# Patient Record
Sex: Male | Born: 1935 | Race: White | Hispanic: No | Smoking: Never smoker
Health system: Southern US, Community
[De-identification: ages and names within clinical notes are randomized; demographics above are authoritative.]

## PROBLEM LIST (undated history)

## (undated) DIAGNOSIS — E119 Type 2 diabetes mellitus without complications: Secondary | ICD-10-CM

## (undated) DIAGNOSIS — I2510 Atherosclerotic heart disease of native coronary artery without angina pectoris: Secondary | ICD-10-CM

## (undated) DIAGNOSIS — C4490 Unspecified malignant neoplasm of skin, unspecified: Secondary | ICD-10-CM

## (undated) DIAGNOSIS — K319 Disease of stomach and duodenum, unspecified: Secondary | ICD-10-CM

## (undated) DIAGNOSIS — E785 Hyperlipidemia, unspecified: Secondary | ICD-10-CM

## (undated) DIAGNOSIS — I10 Essential (primary) hypertension: Secondary | ICD-10-CM

## (undated) HISTORY — DX: Essential (primary) hypertension: I10

## (undated) HISTORY — DX: Unspecified malignant neoplasm of skin, unspecified: C44.90

## (undated) HISTORY — DX: Hyperlipidemia, unspecified: E78.5

## (undated) HISTORY — DX: Atherosclerotic heart disease of native coronary artery without angina pectoris: I25.10

## (undated) HISTORY — DX: Disease of stomach and duodenum, unspecified: K31.9

---
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
## 1968-07-27 HISTORY — PX: TUMOR REMOVAL: SHX12

## 1973-07-27 HISTORY — PX: VASECTOMY: SHX75

## 1990-07-27 HISTORY — PX: PROSTATE SURGERY: SHX751

## 1999-07-28 HISTORY — PX: CORONARY ARTERY BYPASS GRAFT: SHX141

## 1999-10-02 ENCOUNTER — Encounter: Payer: Self-pay | Admitting: Cardiothoracic Surgery

## 1999-10-02 ENCOUNTER — Inpatient Hospital Stay (HOSPITAL_COMMUNITY): Admission: AD | Admit: 1999-10-02 | Discharge: 1999-10-10 | Payer: Self-pay | Admitting: Cardiovascular Disease

## 1999-10-03 ENCOUNTER — Encounter: Payer: Self-pay | Admitting: Cardiothoracic Surgery

## 1999-10-04 ENCOUNTER — Encounter: Payer: Self-pay | Admitting: Cardiothoracic Surgery

## 1999-10-04 ENCOUNTER — Encounter: Payer: Self-pay | Admitting: Thoracic Surgery (Cardiothoracic Vascular Surgery)

## 1999-10-05 ENCOUNTER — Encounter: Payer: Self-pay | Admitting: Cardiothoracic Surgery

## 1999-10-07 ENCOUNTER — Encounter: Payer: Self-pay | Admitting: Cardiothoracic Surgery

## 1999-10-08 ENCOUNTER — Encounter: Payer: Self-pay | Admitting: Cardiothoracic Surgery

## 1999-10-09 ENCOUNTER — Encounter: Payer: Self-pay | Admitting: Cardiothoracic Surgery

## 1999-10-10 ENCOUNTER — Encounter: Payer: Self-pay | Admitting: Cardiothoracic Surgery

## 1999-11-11 ENCOUNTER — Encounter (HOSPITAL_COMMUNITY): Admission: RE | Admit: 1999-11-11 | Discharge: 2000-02-09 | Payer: Self-pay | Admitting: Cardiovascular Disease

## 2000-05-10 ENCOUNTER — Ambulatory Visit (HOSPITAL_COMMUNITY): Admission: RE | Admit: 2000-05-10 | Discharge: 2000-05-10 | Payer: Self-pay | Admitting: Gastroenterology

## 2001-10-05 ENCOUNTER — Encounter: Payer: Self-pay | Admitting: Specialist

## 2001-10-05 ENCOUNTER — Ambulatory Visit (HOSPITAL_COMMUNITY): Admission: RE | Admit: 2001-10-05 | Discharge: 2001-10-05 | Payer: Self-pay | Admitting: Specialist

## 2002-07-28 ENCOUNTER — Encounter: Payer: Self-pay | Admitting: Pediatrics

## 2002-07-28 ENCOUNTER — Ambulatory Visit (HOSPITAL_COMMUNITY): Admission: RE | Admit: 2002-07-28 | Discharge: 2002-07-28 | Payer: Self-pay | Admitting: Pediatrics

## 2002-11-04 ENCOUNTER — Emergency Department (HOSPITAL_COMMUNITY): Admission: EM | Admit: 2002-11-04 | Discharge: 2002-11-04 | Payer: Self-pay | Admitting: Emergency Medicine

## 2006-07-02 ENCOUNTER — Emergency Department (HOSPITAL_COMMUNITY): Admission: EM | Admit: 2006-07-02 | Discharge: 2006-07-02 | Payer: Self-pay | Admitting: Emergency Medicine

## 2006-07-02 IMAGING — CT CT HEAD W/O CM
1 series · 16 of 30 positions shown, 20 images · IV contrast (agent unspecified)
Comparison: None.

CLINICAL DATA: 74 year-old with dizziness.
 HEAD CT WITHOUT CONTRAST:
TECHNIQUE: Contiguous axial images were obtained from the base of the skull through the vertex according to standard protocol without contrast.

[Series 2: head routine 4.8 h47s · axial · 0.50mm/px · z∈[-129,+6]mm · 16 of 30 slices shown, 20 images]
[im 2/30  brain]
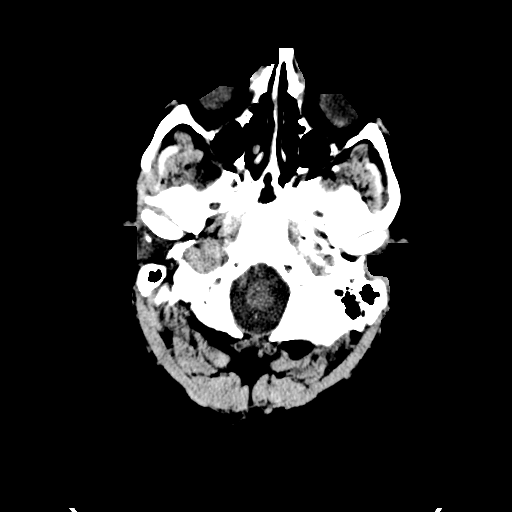
[im 2/30  bone]
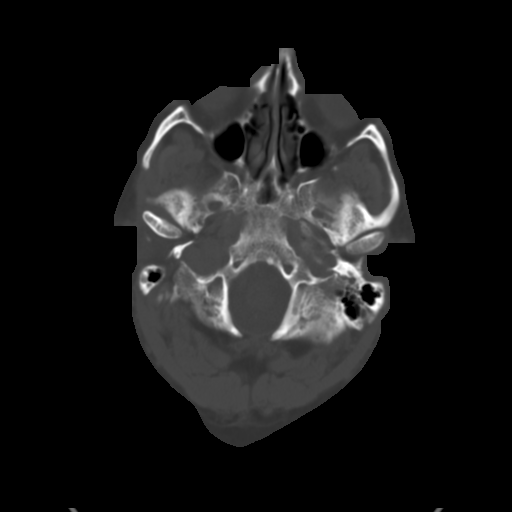
[im 4/30  brain]
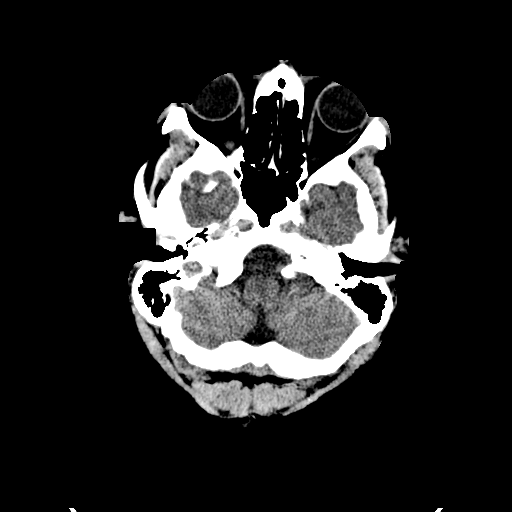
[im 6/30  brain]
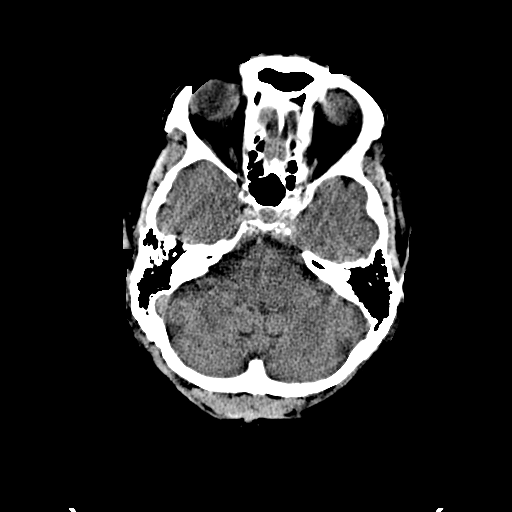
[im 8/30  brain]
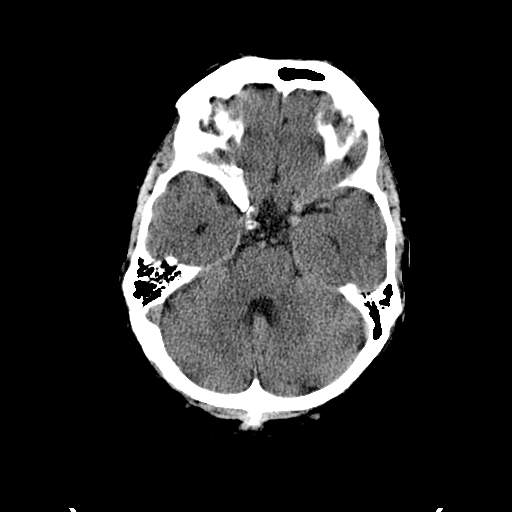
[im 9/30  brain]
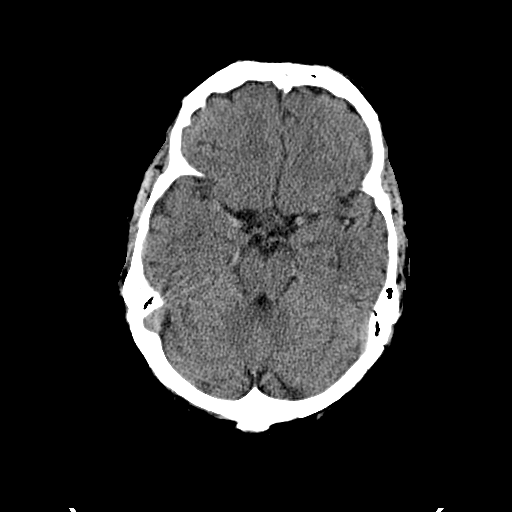
[im 9/30  bone]
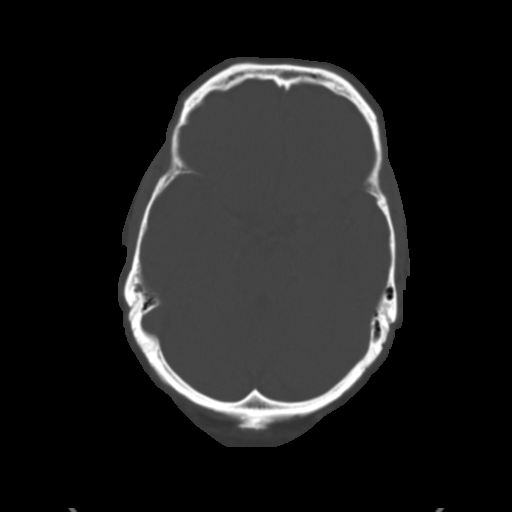
[im 11/30  brain]
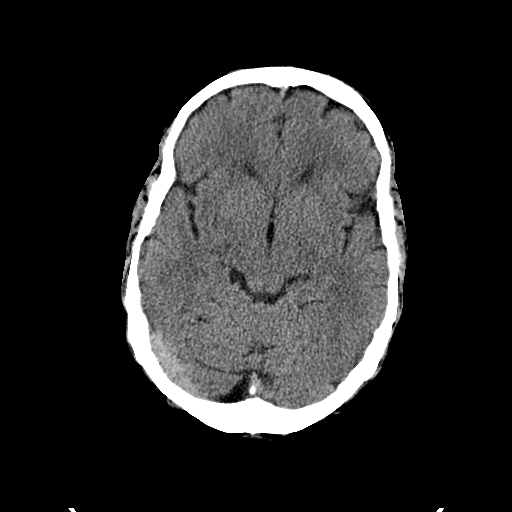
[im 13/30  brain]
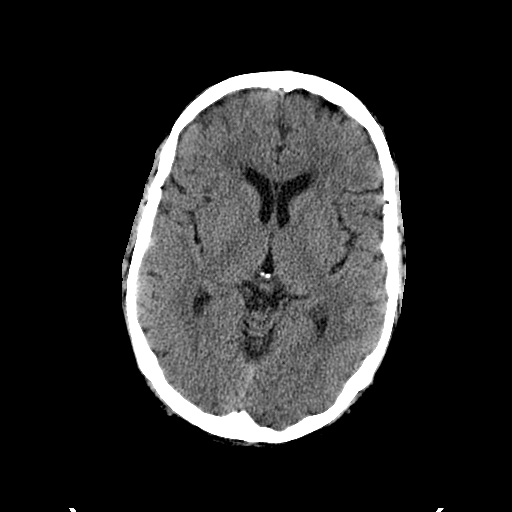
[im 15/30  brain]
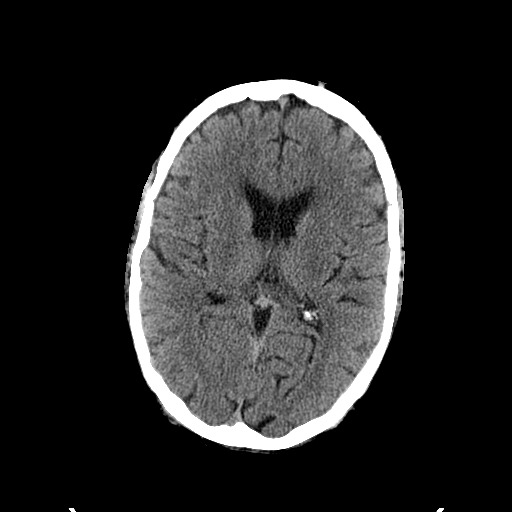
[im 16/30  brain]
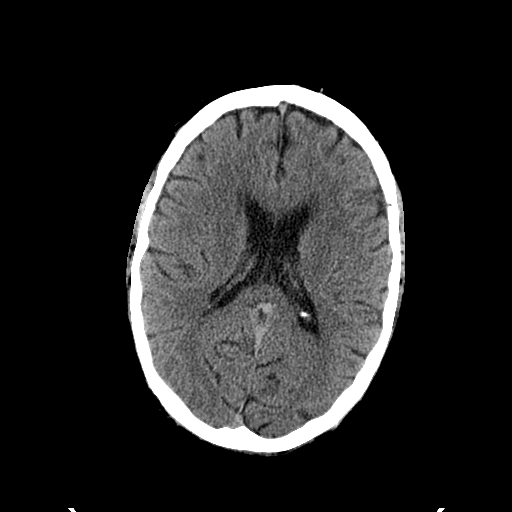
[im 16/30  bone]
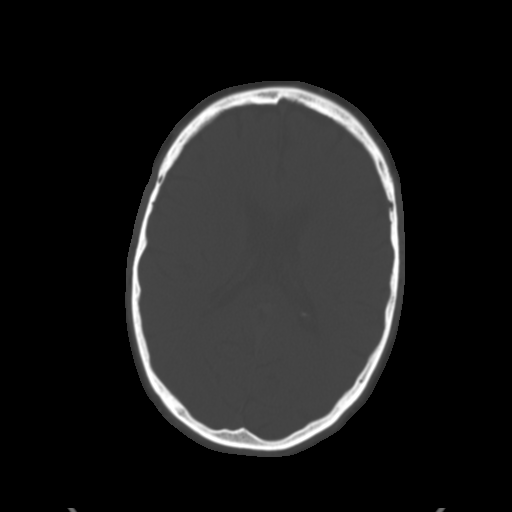
[im 18/30  brain]
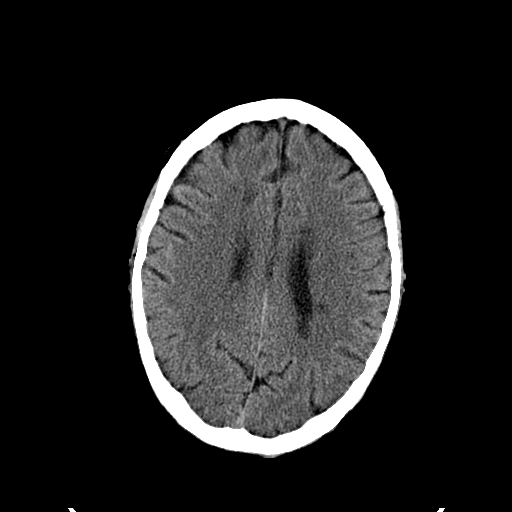
[im 20/30  brain]
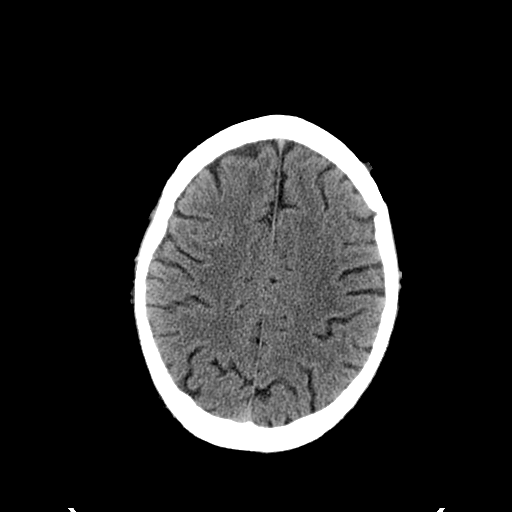
[im 22/30  brain]
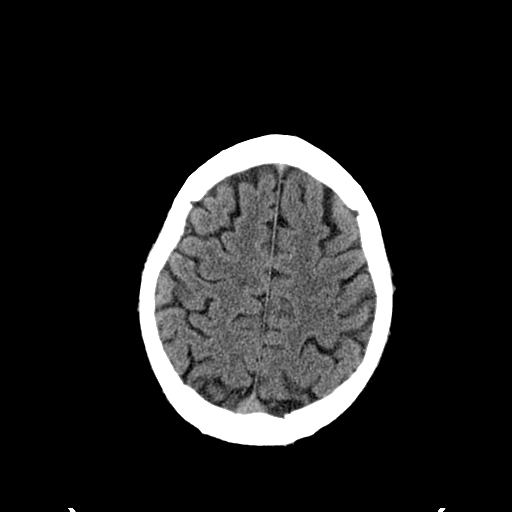
[im 23/30  brain]
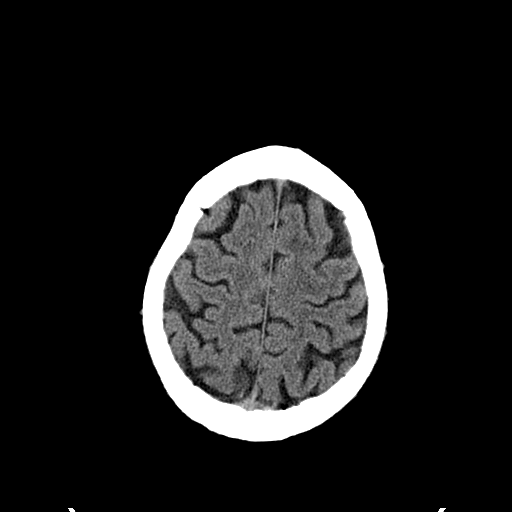
[im 23/30  bone]
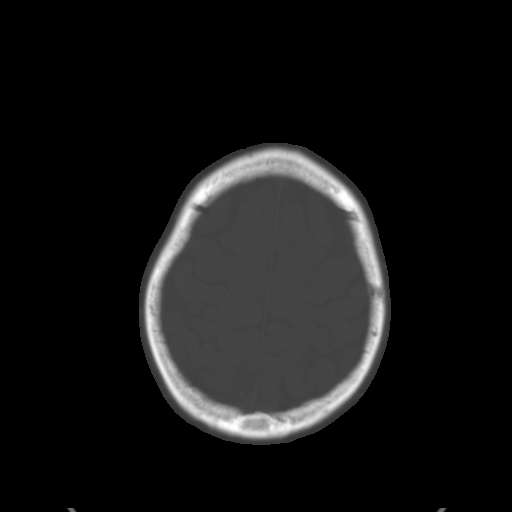
[im 25/30  brain]
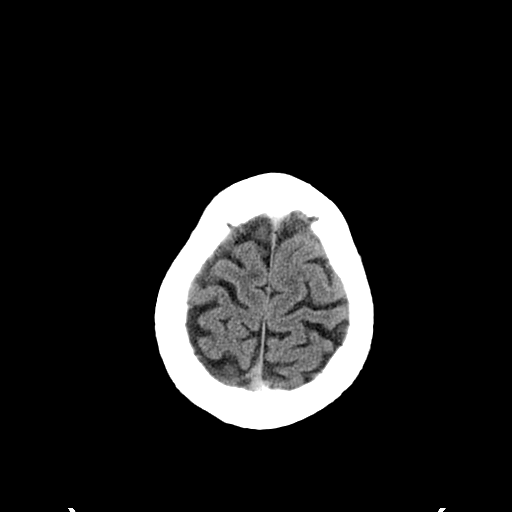
[im 27/30  brain]
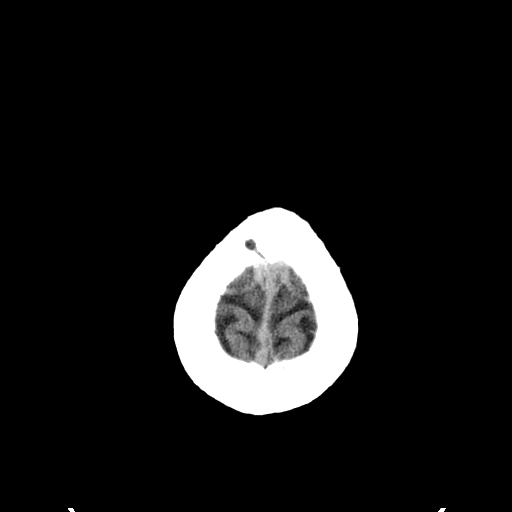
[im 29/30  brain]
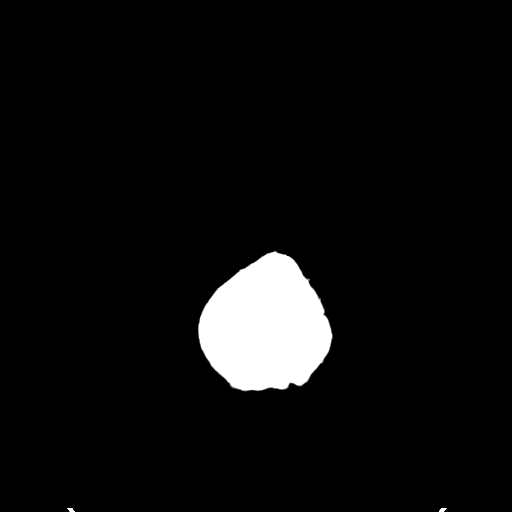

[16 of 30 positions shown; findings below may reference images not displayed]

FINDINGS: Intracranially, the ventricles are in the midline without mass effect or shift.  They are normal in size and configuration.  No extra-axial fluid collections are seen.
 No CT evidence for acute intracranial abnormality.  No intracranial mass lesions.  There are a few small scattered white matter lesions, which are likely areas of microvascular ischemic change.  Brainstem and cerebellum appear normal.  Vascular calcifications are noted.
 The bony calvarium is intact.  The visualized paranasal sinuses and mastoid air cells are clear.  Orbits appear normal and the globes are intact.
IMPRESSION: 1. No CT evidence for acute intracranial abnormality and not intracranial mass lesions.
 2. A few scattered white matter lesions are likely areas of microvascular ischemic change.

## 2008-02-06 ENCOUNTER — Emergency Department (HOSPITAL_COMMUNITY): Admission: EM | Admit: 2008-02-06 | Discharge: 2008-02-06 | Payer: Self-pay | Admitting: Emergency Medicine

## 2008-02-06 IMAGING — CT CT ABDOMEN W/O CM
2 of 4 series · 17 of 46 positions shown, 19 images · non-contrast
Comparison: None

CT ABDOMEN

CLINICAL DATA: RIGHT FLANK PAIN.

CT ABDOMEN AND PELVIS WITHOUT CONTRAST (CT UROGRAM)
TECHNIQUE: Contiguous axial images of the abdomen and pelvis
without oral or intravenous contrast were obtained.

[Series 2: >200 lbs-stone 5.0 b31f · axial · 0.92mm/px · z∈[-503,-83]mm · 14 of 92 slices shown, 16 images]
[im 4/92  soft-tissue]
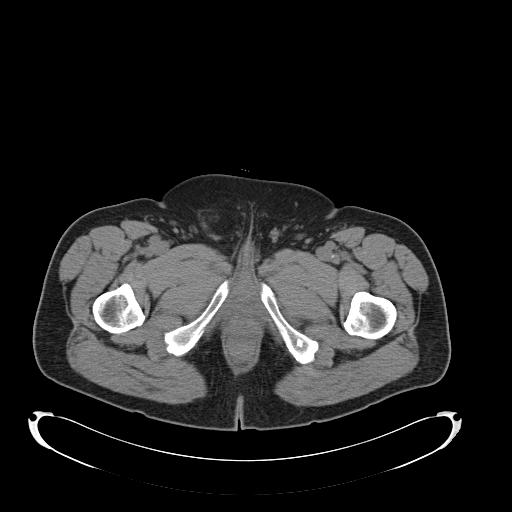
[im 4/92  bone]
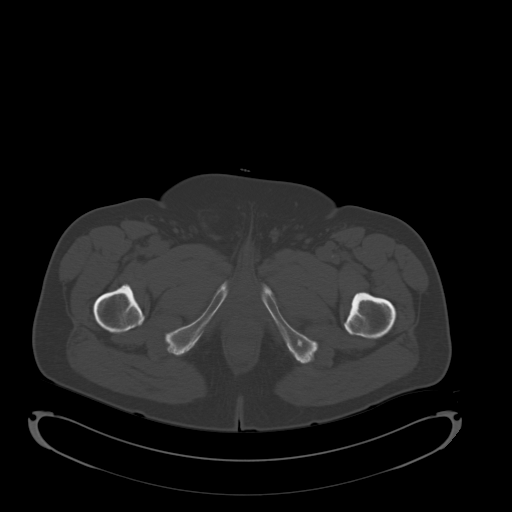
[im 11/92  soft-tissue]
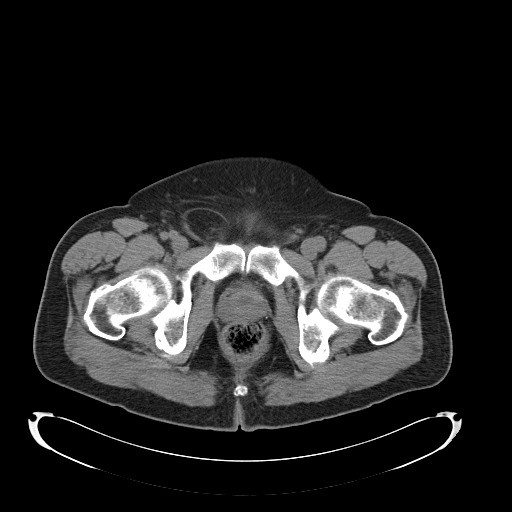
[im 19/92  soft-tissue]
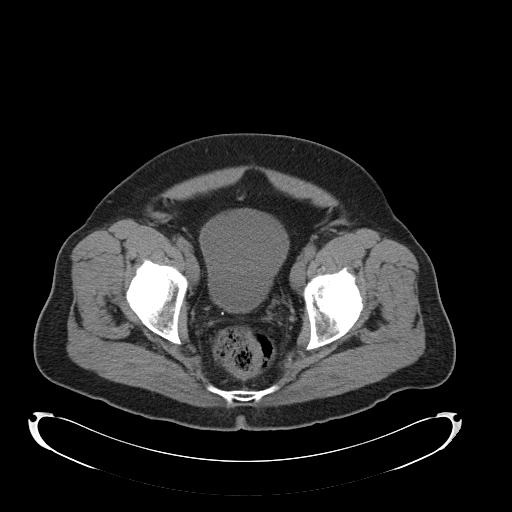
[im 26/92  soft-tissue]
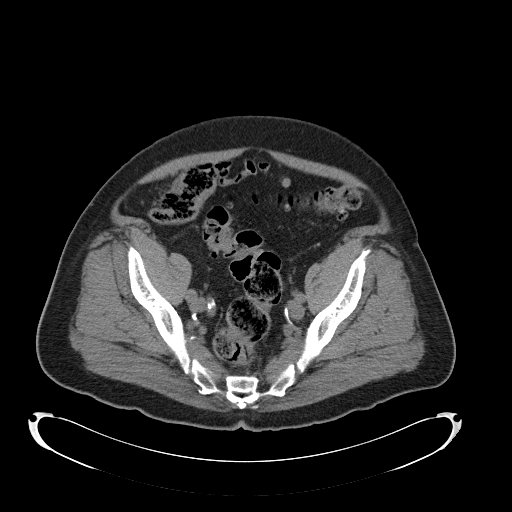
[im 30/92  soft-tissue]
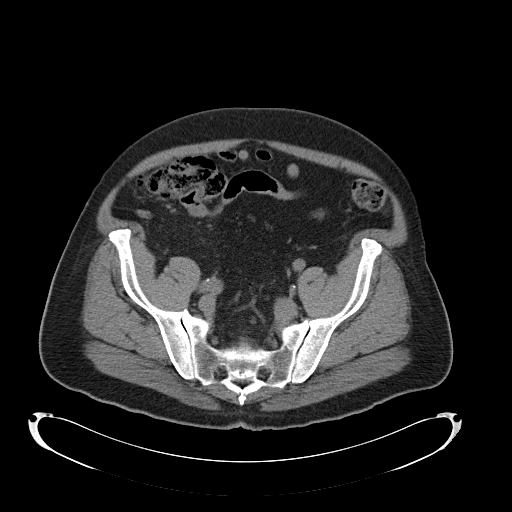
[im 37/92  soft-tissue]
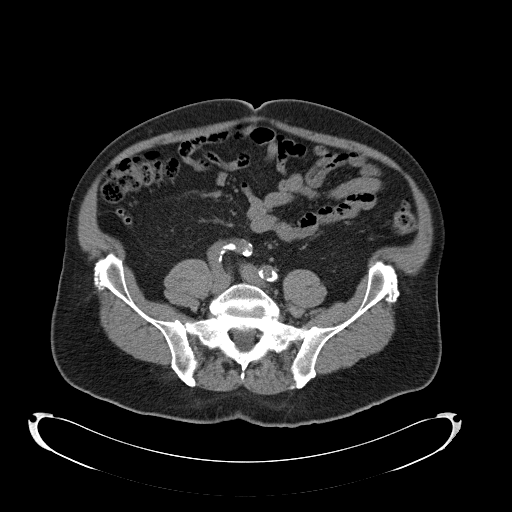
[im 44/92  soft-tissue]
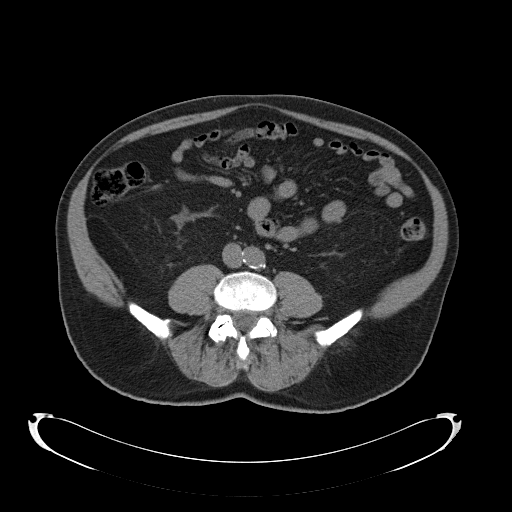
[im 48/92  soft-tissue]
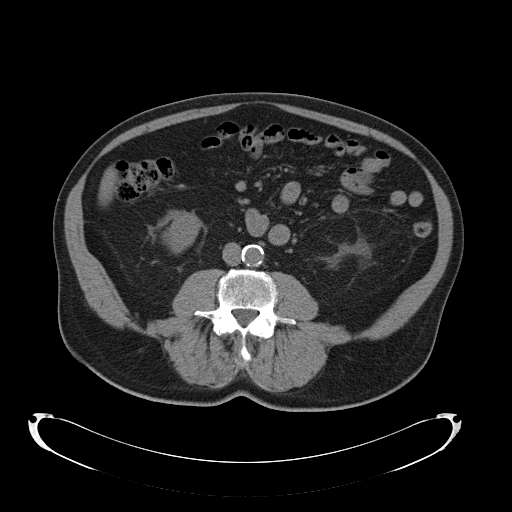
[im 55/92  soft-tissue]
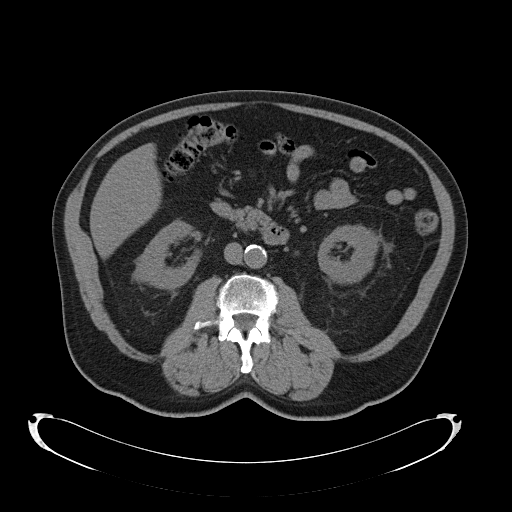
[im 55/92  bone]
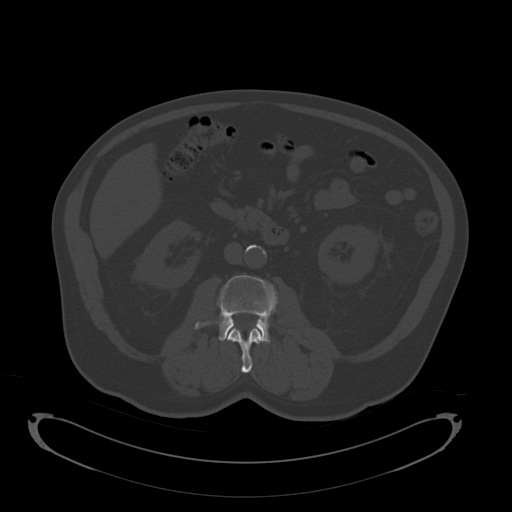
[im 62/92  soft-tissue]
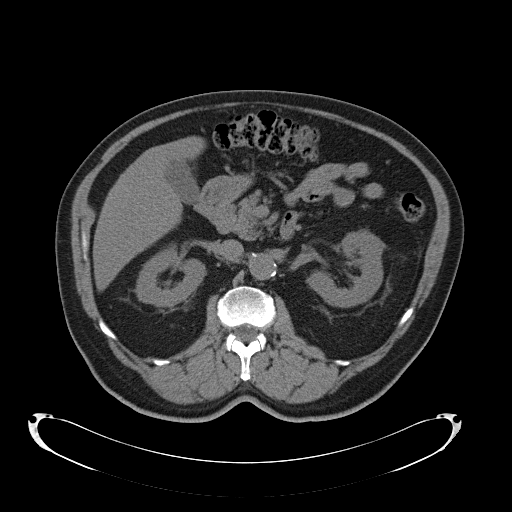
[im 70/92  soft-tissue]
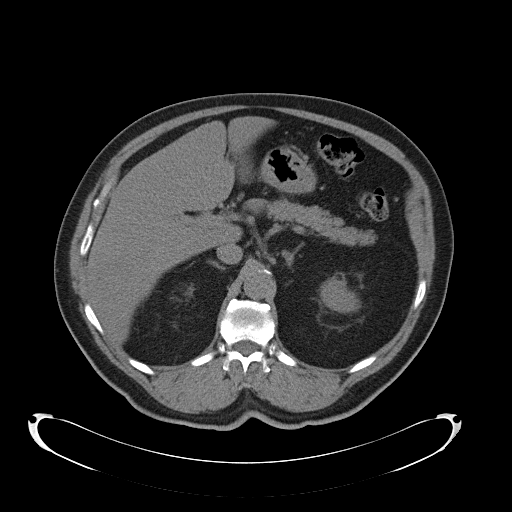
[im 73/92  soft-tissue]
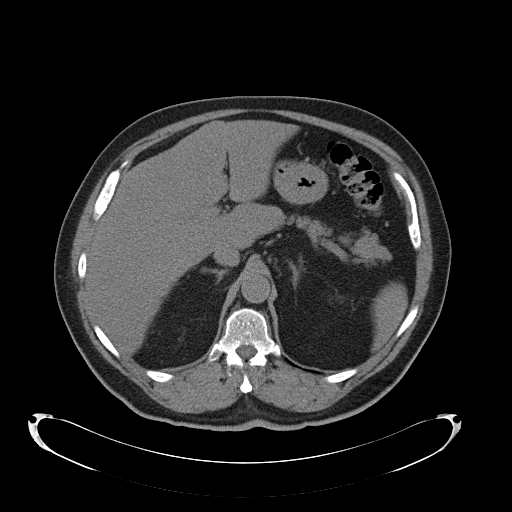
[im 81/92  soft-tissue]
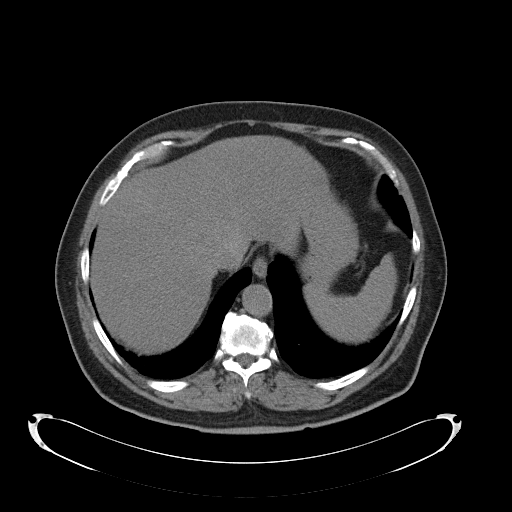
[im 88/92  soft-tissue]
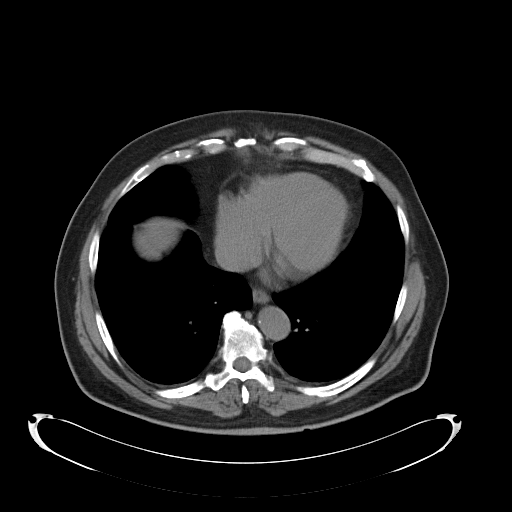

[Series 602: cor · coronal · 0.92mm/px · 3 of 93 slices shown]
[im 31/93  soft-tissue]
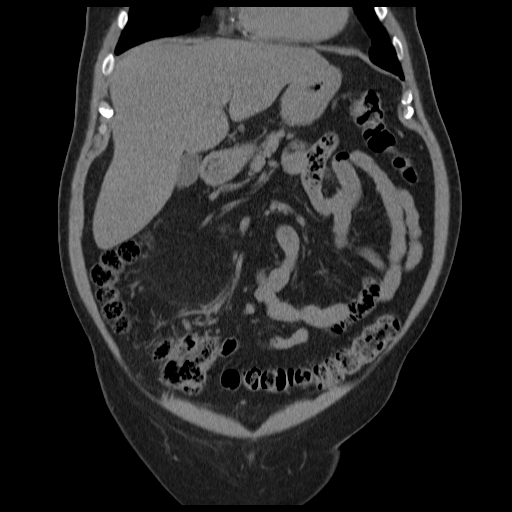
[im 41/93  soft-tissue]
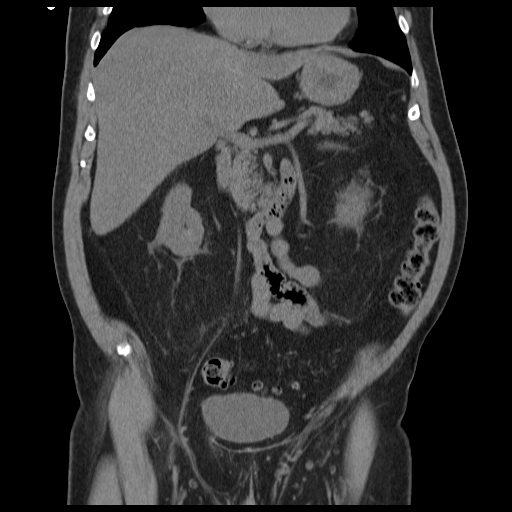
[im 52/93  soft-tissue]
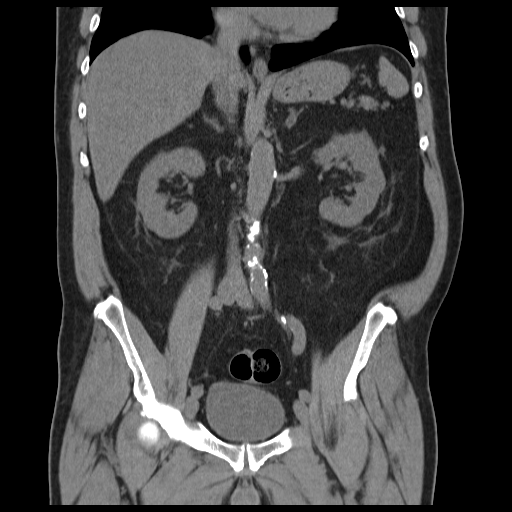

[17 of 46 positions shown; findings below may reference images not displayed]

FINDINGS: Exam is limited for evaluation of entities other than
urinary tract calculi due to lack of oral or intravenous contrast.

 Minimal motion artifact involving the lung bases.  Right lung base
calcified granuloma. Normal heart size without pericardial or
pleural effusion.  Mild fatty infiltration of the liver.  Mildly
prominent caudate lobe.  Fat sparing adjacent to the gallbladder.

Normal spleen.  Under distended stomach.  Normal pancreas,
gallbladder, biliary tract, adrenal glands.

Moderate perirenal edema is symmetric and likely relates to renal
insufficiency.  No renal calculi or hydronephrosis.  No ureteric
calculi. No retroperitoneal or retrocrural adenopathy. Normal
colon, appendix, and terminal ileum.

Normal abdominal small bowel without ascites.
IMPRESSION: 1.  No urinary tract calculi or hydronephrosis.
2.  No other acute process in the abdomen.
3.  Fatty infiltration of the liver.
4.  Normal appendix.

CT PELVIS
FINDINGS: No distal urinary tract calculi.  Moderate stool within
the rectum may relate to constipation.  Sigmoid colon
diverticulosis.  Normal pelvic small bowel loops.  Right inguinal
hernia contains fat.  No pelvic adenopathy or ascites.  Normal
urinary bladder and prostate.  Lumbar spondylosis.
IMPRESSION: 1.  No distal urinary tract calculi.
2.  Question constipation/fecal impaction.
3.  Right inguinal hernia containing fat.

## 2010-12-12 NOTE — Cardiovascular Report (Signed)
[place]. [facility]  Patient:    RASHAUD, YBARBO                    MRN: [id number] Adm. Date:  [id number] Attending:  Mikey Bussing CC:         Fritzi Mandes, M.D.             [facility], Office                        Cardiac Catheterization  INDICATIONS:  Mr. Irfan Veal is a pleasant 77 year old gentleman with a history of longstanding hypertension, hyperlipidemia, and significant family history for coronary artery disease.  As part of a preoperative screening for him being a donor for bone marrow transplantation to his brother who was recently diagnosed with having a CML, he underwent an exercise Cardiolite study.  This was abnormal with definite development of stress-induced inferoseptal ischemia extending from base to the mid ventricle.  Additionally, he also had inferolateral ischemia extending from the mid ventricle to the apex.  There was impaired endocardial thickening and wall motion in the inferior distribution with preserved global LV function and an ejection fraction of 54%.  He is now referred for definitive diagnostic catheterization.  HEMODYNAMIC DATA:  Central aortic pressure 137/82, left ventricular pressure 137/10.  ANGIOGRAPHIC DATA:  There was evidence for mild coronary calcification involving the LAD. 1. The left main was angiographically normal. 2. The LAD had an eccentric 80% proximal stenosis and then was narrowed to 90% n    the region of the first diagonal takeoff.  The first diagonal was small and ad    50% stenosis.  The second diagonal vessel had 50% stenosis, and the LAD lesion    extended beyond the second diagonal vessel and was narrowed up to 50%.  The AD    extended to the LV apex. 3. The circumflex vessel gave rise to a proximal large marginal vessel with    narrowing of 10-20%.  There was diffuse 95% mid AV groove stenosis prior to  third marginal vessel. 4. The right coronary artery  was a moderate caliber vessel that had 70-80% smooth    stenosis of the proximal bend of the vessel.  There was an eccentric shelf with    narrowing of 60-70% in the proximal to mid RCA before the interior RV marginal    branch.  In the region of the crux, there was smooth narrowing of 60-70%.  The left internal mammary artery was normal and suitable for CABG revascularization surgery.  Biplane cine left ventriculography revealed preserved global LV contractility.  A distal aortography did not demonstrate any renal artery stenosis.  There was ot any significant aortoiliac disease.  IMPRESSION: 1. Preserved global LV function. 2. Coronary calcification with significant multi-vessel CAD with 80-90% proximal    diffuse LAD stenosis with involvement involving the takeoff of the first and  second diagonals with stenoses of 50% in each diagonal vessel; 95% mid AV groove    circumflex stenosis; and diffuse RCA disease of 70-80% proximally, 60-70% in the    proximal mid segment eccentrically, and 60-70% distally.  RECOMMENDATION:  CABG revascularization surgery. DD:  10/01/99 TD:  10/02/99 Job: [id number]

## 2010-12-12 NOTE — Procedures (Signed)
[place]. [facility]  Patient:    Peter Mullen, Peter Mullen                    MRN: [id number] Proc. Date: 10/02/99 Adm. Date:  [id number] Attending:  Mikey Bussing CC:         Anesthesia Department                           Procedure Report  PROCEDURE:  Endotracheal intubation.  SURGEON:  Cliffton Asters. Ivin Booty, M.D.  ANESTHESIA:  INDICATIONS:  I was called emergently to the SICU by Kerin Perna III, M.D. or intubation in this gentleman who had undergone coronary artery bypass grafting earlier in the day.  He was recently extubated and was having difficulty maintaining his respirations adequately.  On my arrival, he was somewhat dusky nd intubation was being attempted by Justice Deeds, CRNA with the patient somewhat  awake.  His blood pressure was 40 systolic.  DESCRIPTION OF PROCEDURE:  The vocal cords were visualized with a _____, but they were not open, and an endotracheal tube could not be passed through them adequately.  The patient was then ventilated by mask with 100% oxygen.  He was paralyzed using succinylcholine 120 mg intravenously.  Ventilation was continued until we had good control of his airway.  Once paralysis had occurred, the airway was again visualized using a ___ blade,  and the vocal cords were seen.  However, they were quite swollen.  Even so, we ere able to pass the 7.5 mm endotracheal tube through them to bilateral breath sounds, and positive CO2 noted on the E-Z cap device.  The patient was turned over to the respiratory therapist to maintain his airway.  Dr. Donata Clay was present and aware of all the proceedings. DD:  10/02/99 TD:  10/04/99 Job: [id number]

## 2010-12-12 NOTE — Consult Note (Signed)
[place]. [facility]  Patient:    Peter Mullen, Peter Mullen                    MRN: [id number] Proc. Date: 10/09/99 Adm. Date:  [id number] Attending:  Mikey Bussing CC:         Mikey Bussing, M.D.                          Consultation Report  REASON FOR CONSULTATION:  Dr. Kathlee Nations Trigt III asked [place] to see this very pleasant 77 year old gentleman because of colonic ileus following a CABG which as performed a week ago and was complicated by a little bit of post-intubation respiratory difficulties and some renal insufficiency.  The patient began having gas postop, with distention of the abdomen and initially no passage of flatus. He then developed some crampy abdominal pain.  Recent KUBs have shown a fairly large amount of gas in a moderately dilated colon with a 12-cm cecum as of this mornings film.  However, the patient is passing gas and flatus at this time as well as liquid stool and he is in no pain except for the cramps he experiences right around the time of passage of flatus.  Of note, his potassium is slightly low at 3.6.  Current medications which might influence colonic motility include clonidine and metoprolol.  The patient has also been using some Darvocet-N.  Of note, at baseline, the patient does not have a problem with constipation.  ALLERGIES:  CODEINE (nausea) and VIOXX (GI upset).  OPERATIONS:  Operations include the CABG a week ago, status post TURP and vasectomy.  PAST MEDICAL HISTORY:  Medical illnesses include GERD, IBS, coronary disease per HPI, hypertension, elevated cholesterol, obesity and resolving ATN as well as a  history of gout.  The patient had screening colonoscopy several years ago by me  which showed left-sided diverticulosis and some hyperplastic polyps.  FAMILY HISTORY:  Negative for colon cancer, ulcers, gallstones or inflammatory bowel disease.  SOCIAL HISTORY:  The patient is married with three  children and is a retired Environmental consultant.  REVIEW OF SYSTEMS:  Negative for antecedent constipation.  PHYSICAL EXAMINATION:  GENERAL:  The patient is chipper and in absolutely no distress.  He is without overt pallor or icterus.  VITAL SIGNS:  Vital signs are unremarkable, including the absence of fever.  CHEST:  Clear.  HEART:  Without gallops, rubs, murmurs, clicks or arrhythmias.  ABDOMEN:  Protuberant, which is partly due to his baseline abdominal adiposity ut also due to colonic gas retention, as confirmed by the presence of tympany. Active, somewhat "hollow" bowel sounds are present.  I do not hear any bruits. No gross mass or tenderness are appreciated.  RECTAL:  Exam was not performed.  LABORATORY DATA:  See HPI; basically unremarkable except for potassium of 3.6, which is borderline low.  WBC, when most recently checked, was 8400, two days ago. The patients hemoglobin is slightly low at 9.5.  IMPRESSION:  Colonic ileus, resolving. (The patient passed a large amount of flatus even while we were in the room to talk with him.)  He is pain free without fever, without leukocytosis and has no history of baseline colonic problems.  DISCUSSION AND PLAN:  I think the patient will do well with expectant management. The IV Reglan he is on may be of some help, although generally speaking, metoclopramide does not have prokinetic effects in  the lower GI tract.  The more important thing is to get his potassium in the normal range, as is currently being worked on.  Also, the patient is up and walking, which will probably help improve colonic motility as well.  We will follow the patient with you and agree with the plans to obtain a repeat  abdominal plain film in the morning.  As of this time, I do not see any indication for decompressive colonoscopy or the use of neostigmine.  We appreciate the opportunity to have seen this patient in consultation  with you. DD:  10/09/99 TD:  10/09/99 Job: [id number]

## 2010-12-12 NOTE — Consult Note (Signed)
[place]. [facility]  Patient:    Peter Mullen, Peter Mullen                    MRN: [id number] Proc. Date: 10/01/99 Adm. Date:  [id number] Attending:  Mikey Bussing CC:         Lennette Bihari, M.D., [facility]             Fritzi Mandes, M.D.             [facility]                          Consultation Report  REASON FOR CONSULTATION:  Severe three-vessel coronary artery disease with a p.o.s stress test.  HISTORY OF PRESENT ILLNESS:  The patient is a 77 year old white male patient of  Dr. Daphene Jaeger and Dr. Criss Alvine, who was evaluated for coronary artery disease.  He has a history of hypertension, hyperlipidemia, and a family history of coronary disease and was being evaluated as a potential bone marrow donor for his brother.  A stress Cardiolite scan was performed which showed inferolateral ischemia extending into the LV apex with an ejection fraction of 54%.  At that time his cholesterol total was 176, his triglycerides 150 and his HDL 40.  Because of the stress test being positive for ischemia, he underwent outpatient elective cardiac catheterization  today, which demonstrates severe three-vessel disease including a long 80-90% stenosis of the proximal LAD involving a diagonal, a 90-95% distal circumflex stenosis and 80% stenosis of the proximal right coronary artery with an ejection fraction of 55-60%.  He is not a candidate for angioplasty due to his severe diffuse disease and was referred for coronary revascularization.  The patient denies any symptoms of active angina but does have decreasing exercise tolerance, which he has noticed over the past two months.  He has had a stress test in the  past, approximately five years ago, which he states was normal.  He has not had a previous myocardial infarction or previous cardiac catheterization.  PAST MEDICAL HISTORY:  His history is positive for  hypertension, hyperlipidemia, overweight, and reflux esophagitis.  He has had trouble with gout in the past but is not currently on medication.  CURRENT MEDICATIONS: 1. Prilosec 20 mg q.d. 2. Hyzaar 100/25 mg q.d. 3. Gemfibrozil 600 b.i.d. 4. Cardura 4 mg q.d. 5. Folic acid. 6. Aspirin.  ALLERGIES:  He is not allergic to any medications, but codeine does cause some I intolerance and he is also intolerant of nonsteroidal anti-inflammatory agents ue to gastric symptoms.  SOCIAL HISTORY:  The patient is married and has three children.  He lives with is wife who has had some knee surgery.  He is retired from being a Environmental consultant.  He denies smoking history and has minimal alcohol use.  He does have some coronary artery disease in his family.  REVIEW OF SYSTEMS:  The patients current weight is between 195 and 205 and has been stable.  He denies any fever, night sweats or muscle weakness.  He does not have angina but does have some dyspnea on exertion.  There are no frank symptoms of failure without history of orthopnea, PND or ankle edema.  He denies any significant pulmonary infections, but does have some intermittent problems with  bronchitis and sinusitis.  He had symptoms of bronchitis last month but was not  treated with antibiotics.  He  denies any hemoptysis or history of TB.  There is no GI, history of melena, jaundice, gallstones, but he does have reflux esophagitis. He does have a history of diverticula disease.  He has had previous TURP and had some mild symptoms of BPH.  He possibly had a kidney stone 20-30 years ago. There is no history of DVT, claudication or vascular injuries of his extremities. There is no history of TIA, seizure, concussion, or syncope.  He denies any blood diathesis or easy bruisability.  He denies any skin rash, but he did have a skin cancer removed from his nose last year.  He denies any history of depression  or  emotional problems.  PAST SURGICAL HISTORY:  He has had a TURP, tonsillectomy, vasectomy, and a fatty tumor removal from his trunk.  PHYSICAL EXAMINATION:  VITAL SIGNS:  He is 5 feet 8 inches and weighs 200 pounds.  Blood pressure is 126/79, pulse is 80/min and regular in sinus rhythm.  His oxygen saturations is 98% on room air.  GENERAL APPEARANCE:  General appearance is that of a pleasant, middle-aged white male, in no distress in the catheterization lab holding area without complaint f chest pain.  HEENT:  Normocephalic.  Full EOMs.  He is wearing glasses.  His dentition is adequate and his pharynx is clear.  NECK:  Supple without JVD, thyromegaly, mass, or carotid bruit.  LUNGS:  Clear to auscultation and there are no thoracic deformities.  CARDIAC:  Examination is with a regular rate and rhythm without S3 gallop or murmur.  ABDOMEN:  Soft, nontender, obese, without pulsatile mass and normal bowel sounds.  EXTREMITIES:  Without evidence of swollen tender joints.  He has no ankle edema, 2+ pulses in each lower extremity at the pedal area.  There is no chronic venous insufficiency.  He is right-handed.  NEUROLOGICAL:  He is alert and oriented x 3 with a full motor function.  SKIN:  Clear without rash or lesion and warm.  LABORATORY DATA:  His coronary arteriograms are reviewed in the catheterization lab with Dr. Daphene Jaeger and I agree with his recommendation for coronary revascularization due to his severe three-vessel coronary disease.  IMPRESSION:  Severe three-vessel disease with positive stress test in a 77 year old without prior cardiac history.  PLAN:  We will proceed with coronary revascularization in the morning.  I have discussed the indications and expected benefits of the operation with the patient and his family.  We reviewed the details of the operation including the placement of surgical incisions, the use of cardiopulmonary bypass and  general anesthesia, and the expected recovery.  I discussed the associated risks as well as the  alternatives to surgery with the patient and he understands the risks of associated MI, CV, bleeding, infection, and death.  He agrees to proceed with the operation as planned under informed consent. DD:  10/01/99 TD:  10/01/99 Job: [id number]

## 2010-12-12 NOTE — Discharge Summary (Signed)
[place]. [facility]  Patient:    Peter Mullen, Peter Mullen                    MRN: [id number] Adm. Date:  [id number] Disc. Date: [id number] Attending:  Mikey Bussing Dictator:   Sherrie George, P.A. CC:         Kathlee Nations Suann Larry, M.D.             Coralee North, M.D.             Lennette Bihari, M.D.                           Discharge Summary  ADDENDUM:  The complete discharge summary has been dictated through October 07, 1999.  He is expected to go home in the a.m. of October 18, 1999.  He developed abdominal distention and high-pitched bowel sounds and x-ray was consistent with a clonic ileus.  He was kept n.p.o. and the ileus resolved spontaneous on its own and by October 09, 1999, in the afternoon, he was passing a good deal of flatus.  There was no need for decompression or colonoscopy. He was seen by Dr. Matthias Hughs in consultation.  By October 10, 1999, he was back to a regular diet and was discharged home after lunch.  CONDITION ON DISCHARGE:  Improving.  DISCHARGE MEDICATIONS:  Unchanged. DD:  11/25/99 TD:  11/27/99 Job: [id number]

## 2010-12-12 NOTE — Op Note (Signed)
[place]. [facility]  Patient:    Peter Mullen, Peter Mullen                    MRN: [id number] Proc. Date: 10/02/99 Adm. Date:  [id number] Attending:  Mikey Bussing CC:         CVTS office             Lennette Bihari, M.D.                           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  OPERATION PERFORMED:  Coronary artery bypass grafting x 4 (left internal mammary artery to the left anterior descending, saphenous vein graft to the diagonal, saphenous vein graft to the posterolateral circumflex, saphenous vein graft to posterior descending coronary artery).  SURGEON:  Mikey Bussing, M.D.  ASSISTANT:  Eugenia Pancoast, P.A.  ANESTHESIA:  General.  ANESTHESIOLOGIST:  Dr. Demetria Pore.  INDICATIONS FOR PROCEDURE:  The patient is a 77 year old, overweight, white male with a history of hypertension and hyperlipidemia and a positive family history of coronary disease who presents with a positive stress test and severe three-vessel for coronary disease by angiography performed by Dr. Daphene Jaeger.  He was referred for coronary revascularization due to his bad coronary anatomy and positive stress test.  Prior to the operation, I met with the patient and his family in the cath lab holding area and reviewed the results of the cardiac catheterization with the patient and family.  We discussed the indications and expected benefits of the operation, the details of the procedure, and the expected recovery period.  I reviewed the associated risks of the operation including the risks of MI, CVA, bleeding, blood transfusion, infection and death.  He understood the implications for surgery as well as the alternatives to surgery and agreed to  proceed with the operation as planned under informed consent.  OPERATIVE FINDINGS:  The saphenous vein was of average quality.  The mammary artery was small but had excellent flow.  It was approximately 2 mm in diameter.  The distal circumflex vessel was small and difficult to expose on the high lateral aspect the left ventricle.  The LAD had some diffuse plaque disease.  DESCRIPTION OF PROCEDURE:  The patient was brought to the operating room and placed supine on the operating table where general anesthesia was induced under invasive hemodynamic monitoring.  The chest, abdomen and legs were prepped with Betadine and draped as a sterile field.  A median sternotomy was performed as the saphenous ein was harvested from the left lower extremity.  The internal mammary artery was harvested as a pedicle graft from its origin at the subclavian vessels and was  good vessel with excellent flow.  Heparin was administered systemically and the  sternal retractor was placed.  The ACT was documented as being therapeutic. Through pursestrings placed in the ascending aorta and right atrium, the patient was cannulated and placed on cardiopulmonary bypass and cooled to 32 degrees. he coronaries were inspected and identified for grafting and the mammary artery and vein grafts were prepared for distal anastomoses.  A cardioplegia cannula was placed and the patient was cooled to 28 degrees.  As the aortic crossclamp was applied, 500 cc of cold  blood cardioplegia was delivered to the aortic root with immediate cardioplegic arrest and septal temperature dropping the less than 12 degrees.  Topical iced saline slush was used to augment myocardial preservation and a percardial insulator pad was used to protect the left phrenic nerve.  The distal coronary anastomoses were then performed.  The first distal anastomosis was to the posterior descending on the inferior wall of the LV.  It was a  1.8 mm vessel with proximal 90% stenosis and a reversed saphenous vein was sewn end-to-side with running 7-0 Prolene with good flow through the graft.  A second distal anastomosis was to the second diagonal which was a 1.5 mm vessel with proximal 90% stenosis.  A reversed saphenous vein was sewn end-to-side with running 7-0 Prolene and there was good flow through the graft.  A third distal anastomosis was to the distal circumflex at the posterolateral branch which was a 1.5 mm vessel.  This had a proximal 95% stenosis and a reversed saphenous vein was sewn end-to-side with running 7-0 Prolene and there was good flow through the graft.  Cardioplegia was redosed through the vein grafts.  The fourth distal anastomosis was to the mid-LAD which was a 1.8 mm vessel with proximal 99% stenosis.  The left internal mammary artery pedicle was brought through an opening created in the left lateral pericardium and was brought down on the LAD and sewn end-to-side with running 8-0 Prolene.  There was excellent flow through the anastomosis with immediate rise in septal temperature after release of the pedicle clamp on the mammary artery.  The mammary pedicle was secured to the epicardium and the aortic crossclamp was removed.  The heart was cardioverted back to a regular rhythm.  A partial occlusion clamp was placed on the ascending aorta and three proximal vein anastomoses were performed using running 6-0 Prolene.  The partial occlusion clamp was removed and the vein grafts were perfused.  Hemostasis was adequate at both the proximal and distal sites.  The patient was rewarmed to 37 degrees and temporary pacing wires were applied.  When the patient reached 37 degrees, the lungs were re-expanded and the ventilator was turned back on and the patient was weaned from cardiopulmonary bypass without difficulty.  Upon separation from cardiopulmonary bypass, it was  apparent there was some bleeding  from the distal right coronary anastomosis and it was elected to place the patient back on bypass in order to do this.  The patient was then placed back on bypass after adequate heparinization was documented and   while on bypass and an empty beating heart, a single repair suture was placed and hemostasis was documented.  A probe was placed through the anstomosis from a side branch of the vein to assure patency.  This was a 2 mm probe and it fell easily  through the anastomosis.  The patient was then separated again from cardiopulmonary bypass and the heparin was reversed with protamine and the cannulas were removed. Hemostasis was adequate although there was some evidence of diffuse coagulopathy consistent with his preoperative use of Plavix.  The mediastinum was irrigated ith warm antibiotic irrigation.  The leg incision was irrigated and closed in the standard fashion.  The mediastinum was closed loosely with the thymic and pleural fat over the heart and vein grafts.  Two mediastinal and a left pleural chest tube were placed and brought out through separate incisions.  The sternum was reapproximated with interrupted steel wire and the pectoralis fascia and subcutaneous layers  were closed with running Vicryl.  The skin was closed with  subcuticular and sterile dressings were applied.  Total cardiopulmonary bypass ime was 120 minutes with aortic crossclamp time of 60 minutes. DD:  10/02/99 TD:  10/04/99 Job: [id number]

## 2010-12-12 NOTE — Discharge Summary (Signed)
[place]. [facility]  Patient:    Peter Mullen, Peter Mullen                    MRN: [id number] Adm. Date:  [id number] Disc. Date: 10/08/99 Attending:  Mikey Bussing Dictator:   Sherrie George, P.A. CC:         Fritzi Mandes, M.D.             Lennette Bihari, M.D.                           Discharge Summary  DATE OF BIRTH:  1932-05-08.  ADMISSION DIAGNOSES: 1. Positive Cardiolite study. 2. Hypertension. 3. Hyperlipidemia. 4. Obesity. 5. Gastroesophageal reflux disease.  DISCHARGE DIAGNOSES: 1. Three-vessel coronary artery disease with normal left ventricular function,    ejection fraction 54% on Cardiolite study, September 22, 1999. 2. Acute renal failure secondary to acute tubular necrosis. 3. Hypertension. 4. Hyperlipidemia. 5. Obesity. 6. Gastroesophageal reflux disease.  PROCEDURES: 1. Cardiac catheterization, October 01, 1999. 2. Coronary artery bypass grafting x 4 -- left internal mammary artery to the left    anterior descending, saphenous vein graft to the diagonal, saphenous vein graft    to the posterolateral, saphenous vein graft to the posterior descending,    October 02, 1999, Dr. Kathlee Nations Trigt III.  BRIEF HISTORY:  The patient is a 77 year old white male, a medical patient of Dr. Fritzi Mandes and a cardiology patient of Dr. Lennette Bihari.  Patient as initially referred to Dr. Tresa Endo for cardiac evaluation prior to becoming a bone  marrow donor for his brother who was diagnosed as having CML.  Patient has a history of hypertension and hyperlipidemia and some dyspeptic symptoms, but he denied any chest pain or any history of cardiac problems.  He underwent Cardiolite study on September 22, 1999, at which time the overall impression was a negative  Bruce protocol exercise test with evidence of inferoseptal ischemia extending from the base to the mid-ventricle; additionally, there was noted to be inferolateral ischemia  extending from the mid-ventricle into the apex.  Dynamic gating reveals impaired endocardial thickening and wall motion in the inferior distribution with a preserved EF calculated at 54%.  After completion of this study, it was recommended he be admitted for elective cardiac catheterization to rule out coronary artery  disease prior to becoming a donor for his brother.  PAST MEDICAL HISTORY:  Positive for hypertension, hyperlipidemia, obesity and reflux esophagitis.  He has also had some gout in the past but is currently not  being treated for it.  MEDICATIONS ON ADMISSION: 1. Prilosec 20 mg p.o. q.d. 2. Hyzaar 100/25 one q.d. 3. Gemfibrozil 600 mg b.i.d. 4. Cardura 4 mg q.d. 5. Folic acid. 6. Aspirin.  ALLERGIES:  None known but he is sensitive to CODEINE, which causes GI intolerance, and he is also intolerant to NONSTEROIDAL ANTI-INFLAMMATORY AGENTS, which increase gastric symptoms.  SOCIAL HISTORY:  He is married and has three children.  He lives with his wife, who has had some knee surgery.  He is a retired Environmental consultant.  He denies smoking history and has minimal alcohol use.  He does have some coronary  artery disease in his family.  NOTE:  For further history and physical, please see the dictated note from Dr. Ellin Goodie office.  HOSPITAL COURSE:  Patient was admitted and taken to the cardiac cath lab, at which time  he underwent cardiac catheterization by Dr. Nicki Guadalajara.  The overall impression was preserved global LV function.  There was significant coronary artery disease with an 80-90% proximal diffuse LAD stenosis involving the takeoff of the first and second diagonals, with stenosis of 50% in each diagonal vessel, 95% mid A-V groove circumflex stenosis, diffuse RCA disease, 70-80% proximally and 60-80% in the proximal mid-segment eccentrically and a 60-70% distally.  At this point, Dr. Kathlee Nations Trigt was contacted.  He saw the patient in  consultation; after reviewing the studies, he agreed that patient had 95% LAD and 90% diagonal, 95% OM, and an 80% right coronary artery stenosis.  He recommended coronary artery bypass grafting.  The risks and benefits were discussed with the patient and informed operative consent was obtained.  Patient underwent routine preoperative studies and preoperative teaching.  Doppler studies were normal with palpable pulses bilaterally.  There was no evidence of significant ICA stenosis and patient had  bilateral antegrade vertebral flow.  On October 02, 1999, patient was taken to the  operating room and underwent coronary artery bypass grafting x 4 with the left internal mammary artery to the LAD, saphenous vein graft to the diagonal, saphenous vein graft to the posterolateral-circumflex, saphenous vein graft to the posterior descending coronary artery on October 02, 1999, Dr. Donata Clay.  Patient tolerated he procedure well and returned to the intensive care unit in satisfactory condition. He was maintained on a low-dose nitroglycerin and dopamine drip.  He was hemodynamically stable and kept on a ventilator overnight.  He remained hemodynamically stable.  The first postoperative morning, it was noted his creatinine was up to 2.7 with a BUN of 25.  His admission creatinine on September 29, 1999 was 1.3 and a creatinine on August 22, 1999 was 1.3.  I see no creatinine obtained after the cardiac catheterization.  Cardiac index was 2.3. PAD was 17 to 20.  Patient was 1250 cc positive for the 24 hours.  Chest x-ray was clear.  Patient was sedated and intubated at this point.  Dr. Donata Clay saw the  patient and started him on Lasix, steroids and renal-dose dopamine, with hopes o extubate the patient later in the day.  That evening, the patient was without complaints.  He was extubated and breathing comfortably.  He was in a sinus rhythm and afebrile.  Blood pressure was 140-150 systolic.  O2  saturations were 92-93% on a 4 L nasal cannula.  Chest showed a few crackles.  No wheezes.  I&O was negative  about 400 cc for the first two shifts.  He was given additional Lasix. Creatinine was noted to be up to 3.9.  BUN was 39.  Potassium was 4.2.  On October 04, 1999,  patient continued to have a rise in his creatinine up to 4.2.  BUN was 49. Potassium was 4.2.  CBGs were less than 200.  Chest x-ray was a poor-quality film but there were no obvious changes in the lung volumes.  Patient was maintained n high-dose Lasix and a diuretic challenge keep him in the ICU and nephrology consult was obtained.  Patient was seen by Dr. Delano Metz of the renal service.  It was his impression that the patient had acute renal failure, nonoliguric, due to acute tubular necrosis related to intravenous contrast and ischemia; he also thought he patient had increased extracellular liver fluid volume without congestive heart  failure, status post coronary artery bypass grafting and cardiac catheterization. He continued the high-dose Lasix to  keep the urine output 80 to 100 cc or more er hour.  There were hopes he would be able to dialyze within the next couple of weeks.  Plans were made to adjust his medicines and avoid ACE inhibitors, angiotensin and receptor blockers until the patient had recovered.  Patient showed slow steady progress and was transferred to the floor.  He has been mobilized. His creatinine has continued to drop; on October 05, 1999, it was down to 3.8, on October 06, 1999, creatinine was down to 2.8 and by October 07, 1999, his creatinine is down to 1.9 with a BUN of 57.  He is hemodynamically stable and has been in sinus rhythm on telemetry.  He was seen by Dr. Madaline Savage for the [facility] group.  He agreed with the current plan and it was his impression patient might be ready for discharge home by Wednesday, October 08, 1999.  Currently, his weight is 215 pounds; his  preop weight is listed as 223 pounds.  He is ambulating well.  His abdomen s somewhat distended but is nontender or tympanic.  He has positive bowel sounds nd has had bowel movement and flatus within the last 24 hours.  It was Dr. Reino Kent  hope that if the patient continued to improve, he could go home in the a.m. of October 08, 1999.  DISCHARGE MEDICATIONS: 1. Clonidine TTS-2 patch one q.wk. 2. Multivitamin with iron one q.d. 3. Nasonex two puffs q.d., each nostril -- he has this at home. 4. Lopressor 25 mg p.o. b.i.d. -- this is a new medicine. 5. Claritin 10 mg one p.o. q.d. 6. Coated aspirin 325 mg one q.d. 7. Prilosec one to two p.o. q.4h. p.r.n.  ______ 8. Tylox one to two p.o. q.4h. p.r.n.  DISCHARGE ACTIVITY:  Light to moderate.  No lifting over 10 pounds.  No driving. No strenuous activity.  FOLLOWUP:  Patient is to return to CVTS and Dr. Donata Clay on Friday, October 31, 1999, at 9 a.m, follow up with Dr. Tresa Endo in two weeks and Dr. Criss Alvine as instructed, and will recheck his BMET on October 13, 1999 at the [facility], with results to be called to both Dr. Tresa Endo and Dr. Vincent Gros office.  DISCHARGE LABORATORY AND X-RAY DATA:  His creatinine is down to 1.9, as noted above.  WBC is 7.7, hemoglobin is 9, hematocrit is 25.5 and platelets are 244,000. Patient also had some hyperglycemia during his postoperative period.  During surgery on October 02, 1999, glucoses were 172, 238 and 198; postoperatively, it ranged from 196 to 281.  Since the 281 figure, his glucose has steadily come down and is currently down to 120 on October 06, 1999.  Last x-ray on October 05, 1999 showed improved aeration in the lung bases with resolved atelectasis.  CONDITION ON DISCHARGE:  Currently, the patient is doing very well.  If his renal function continues to go back down to normal and he has no further problems, we  anticipate discharge on October 08, 1999 or October 09, 1999.  DD:  10/07/99 TD:  10/07/99 Job:  [id number]

## 2011-04-23 LAB — URINALYSIS, ROUTINE W REFLEX MICROSCOPIC
Hgb urine dipstick: NEGATIVE
Nitrite: NEGATIVE
Specific Gravity, Urine: 1.024
Urobilinogen, UA: 1

## 2012-12-28 ENCOUNTER — Ambulatory Visit (INDEPENDENT_AMBULATORY_CARE_PROVIDER_SITE_OTHER): Payer: Medicare Other | Admitting: Cardiovascular Disease

## 2012-12-28 ENCOUNTER — Encounter: Payer: Self-pay | Admitting: Cardiovascular Disease

## 2012-12-28 VITALS — BP 130/80 | HR 66 | Ht 60.8 in | Wt 203.7 lb

## 2012-12-28 DIAGNOSIS — I2510 Atherosclerotic heart disease of native coronary artery without angina pectoris: Secondary | ICD-10-CM | POA: Insufficient documentation

## 2012-12-28 DIAGNOSIS — I119 Hypertensive heart disease without heart failure: Secondary | ICD-10-CM | POA: Insufficient documentation

## 2012-12-28 DIAGNOSIS — E785 Hyperlipidemia, unspecified: Secondary | ICD-10-CM

## 2012-12-28 DIAGNOSIS — E119 Type 2 diabetes mellitus without complications: Secondary | ICD-10-CM

## 2012-12-28 DIAGNOSIS — R002 Palpitations: Secondary | ICD-10-CM

## 2012-12-28 DIAGNOSIS — G4730 Sleep apnea, unspecified: Secondary | ICD-10-CM | POA: Insufficient documentation

## 2012-12-28 NOTE — Progress Notes (Signed)
Patient ID: Peter Mullen, male   DOB: May 29, 1932, 79 y.o.   MRN: [id number]   HPI: Peter Mullen, is a 79 y.o. male who presents to the office today for 7 month cardiology evaluation. [name] has established coronary artery disease in March 2001 underwent CABG revascularization surgery with a LIMA to the LAD, SVG to diagonal, SVG to the PLA, and SVG to the PDA vessel. Additional medical problems include hypertension, type 2 diabetes mellitus, obstructive sleep apnea, mild for which she's not been on CPAP therapy to his last nuclear perfusion study was in October 2012 which showed normal perfusion. He has documented grade 1 diastolic dysfunction EA ratio 3.24 a 2-D echo imaging with mild aortic sclerosis without stenosis, trace MR and trace TR in October 2012. He has a history of mixed hyperlipidemia. He sees Dr. Ward Chatters for his primary medical care. He tells me Dr. Neale Burly will be checking laboratory near future.  Past medical history: CAD, status post CABG, hypertension, type 2 diabetes mellitus, mixed hyperlipidemia, obstructive sleep apnea, mild  Past surgical history notable for CABG surgery, cataract surgery 2005 prostate surgery 1989.  Current Outpatient Prescriptions  Medication Sig Dispense Refill  . Ascorbic Acid (VITAMIN C) 1000 MG tablet Take 1,000 mg by mouth daily.      [name] aspirin 81 MG tablet Take 81 mg by mouth daily.      [name] atorvastatin (LIPITOR) 20 MG tablet Take 20 mg by mouth daily.      [name] BIOTIN 5000 PO Take by mouth.      . cetirizine (ZYRTEC) 10 MG tablet Take 10 mg by mouth daily.      . Choline Fenofibrate (TRILIPIX) 135 MG capsule Take 135 mg by mouth daily.      . Cinnamon 500 MG capsule Take 500 mg by mouth daily.      . clopidogrel (PLAVIX) 75 MG tablet Take 75 mg by mouth daily.      . Cyanocobalamin (VITAMIN B 12 PO) Take 2,500 mcg by mouth.      . fish oil-omega-3 fatty acids 1000 MG capsule Take 2 g by mouth daily.      . folic acid (FOLVITE) 800 MCG  tablet Take 400 mcg by mouth daily.      . hydrochlorothiazide (HYDRODIURIL) 25 MG tablet Take 25 mg by mouth daily.      [name] linagliptin (TRADJENTA) 5 MG TABS tablet Take 5 mg by mouth daily.      [name] losartan (COZAAR) 50 MG tablet Take 50 mg by mouth daily.      . metFORMIN (GLUCOPHAGE) 1000 MG tablet Take 1,000 mg by mouth 2 (two) times daily with a meal.      . metoprolol succinate (TOPROL-XL) 100 MG 24 hr tablet Take 100 mg by mouth daily. Take with or immediately following a meal.      . montelukast (SINGULAIR) 10 MG tablet Take 10 mg by mouth at bedtime.      . Omega-3 Fat Ac-Cholecalciferol (MINICAPS VITAMIN-D/OMEGA-3) [phone] MG-UNIT CAPS Take 2,000 Int'l Units by mouth.      [name] omeprazole (PRILOSEC) 20 MG capsule Take 20 mg by mouth daily.      [name] OVER THE COUNTER MEDICATION Biotin 5000 mg      . terazosin (HYTRIN) 5 MG capsule Take 5 mg by mouth at bedtime.      [name] testosterone (ANDROGEL) 50 MG/5GM GEL Place 5 g onto the skin daily.      . vitamin E 400  UNIT capsule Take 400 Units by mouth daily.       No current facility-administered medications for this visit.    He is widowed per history children 4 grandchildren. There is no tobacco use. He does drink occasional alcohol.  ROS negative for fever chills night sweats denies weight loss. Does wear glasses. He remains fairly active. He walked in [place] last week without problems. In the heat at times does note some mild shortness of breath. He denies recent chest pressure. At times there are some rare palpitations. He denies associated presyncope or syncope. At times he does note some occasional GI issues. He has seen Dr. Ewing Schlein in the past. Significant edema. He denies myalgias. Otherwise his review is negative.  PE BP 130/80  Pulse 66  Ht 5' 0.8" (1.544 m)  Wt 203 lb 11.2 oz (92.398 kg)  BMI 38.76 kg/m2  General: Alert, oriented, no distress.  HEENT: Normocephalic, atraumatic. Pupils round and reactive; sclera anicteric;no lid  lag,  Nose without nasal septal hypertrophy Mouth/Parynx benign; Mallinpatti scale 3 Neck: No JVD, no carotid briuts Lungs: clear to ausculatation and percussion; no wheezing or rales Heart: RRR, s1 s2 normal  there isolate PVC. 1/6 systolic murmur Abdomen: Mild adiposity;   soft, nontender; no hepatosplenomehaly, BS+; abdominal aorta nontender and not dilated by palpation. Pulses 2+ Extremities: no clubbing cyanosis or edema, Homan's sign negative  Neurologic: grossly nonfocal  ECG: Sinus rhythm first degree AV block with a PR interval 270 ms. Unifocal rare PVCs. QTc interval is normal.  LABS:  BMET No results found for this basename: na, k, cl, co2, glucose, bun, creatinine, calcium, gfrnonaa, gfraa     Hepatic Function Panel  No results found for this basename: prot, albumin, ast, alt, alkphos, bilitot, bilidir, ibili     CBC No results found for this basename: wbc, rbc, hgb, hct, plt, mcv, mch, mchc, rdw, neutrabs, lymphsabs, monoabs, eosabs, basosabs     BNP No results found for this basename: probnp    Lipid Panel  No results found for this basename: chol, trig, hdl, cholhdl, vldl, ldlcalc     RADIOLOGY: No results found.    ASSESSMENT AND PLAN: [name] is now 10 years status post CABG revascularization surgery. He was planning to go through to bone undergoing a procedure for his brother and at that time a preoperative nuclear perfusion study was abnormal which led to his catheterization and ultimate detection of significant multivessel CAD. He does note an occasional palpitation has isolated PVC on ECG. He does have first degree block. He currently is on metoprolol 100 mg etc. least daily to do be having laboratory done in the near future by Dr. Neale Burly and I will review these. He has been trying to lose some weight. He denies any difficulty with his sleep pattern. At the time of his initial sleep study in 2008 he was detected to have mild sleep apnea and has  not been on CPAP therapy to in November 2014, am recommending he undergo a two-year followup nuclear perfusion study to reassess his coronary anatomy, particularly since in the past prior to his discovery of his coronary disease he was not having any chest pain symptoms. I will see him back in the office in followup and further recommendations will be made at that time.      Lennette Bihari, MD, [facility]  12/28/2012 2:05 PM

## 2012-12-28 NOTE — Patient Instructions (Signed)
Your physician has requested that you have a lexiscan myoview. For further information please visit [email or web address] Please follow instruction sheet, as given.   This will be scheduled in December of this year.  Your physician recommends that you schedule a follow-up appointment in: 6 months.

## 2013-06-12 ENCOUNTER — Encounter: Payer: Self-pay | Admitting: Cardiovascular Disease

## 2013-06-30 ENCOUNTER — Ambulatory Visit (HOSPITAL_COMMUNITY)
Admission: RE | Admit: 2013-06-30 | Discharge: 2013-06-30 | Disposition: A | Discharge status: Home / Self Care | Payer: Medicare Other | Source: Ambulatory Visit | Attending: Cardiovascular Disease | Admitting: Cardiovascular Disease

## 2013-06-30 DIAGNOSIS — E669 Obesity, unspecified: Secondary | ICD-10-CM | POA: Insufficient documentation

## 2013-06-30 DIAGNOSIS — R42 Dizziness and giddiness: Secondary | ICD-10-CM | POA: Insufficient documentation

## 2013-06-30 DIAGNOSIS — Z8249 Family history of ischemic heart disease and other diseases of the circulatory system: Secondary | ICD-10-CM | POA: Insufficient documentation

## 2013-06-30 DIAGNOSIS — E119 Type 2 diabetes mellitus without complications: Secondary | ICD-10-CM | POA: Insufficient documentation

## 2013-06-30 DIAGNOSIS — I2510 Atherosclerotic heart disease of native coronary artery without angina pectoris: Secondary | ICD-10-CM

## 2013-06-30 DIAGNOSIS — I700 Atherosclerosis of aorta: Secondary | ICD-10-CM | POA: Insufficient documentation

## 2013-06-30 DIAGNOSIS — R002 Palpitations: Secondary | ICD-10-CM | POA: Insufficient documentation

## 2013-06-30 DIAGNOSIS — R079 Chest pain, unspecified: Secondary | ICD-10-CM | POA: Insufficient documentation

## 2013-06-30 DIAGNOSIS — I10 Essential (primary) hypertension: Secondary | ICD-10-CM | POA: Insufficient documentation

## 2013-06-30 DIAGNOSIS — Z8673 Personal history of transient ischemic attack (TIA), and cerebral infarction without residual deficits: Secondary | ICD-10-CM | POA: Insufficient documentation

## 2013-06-30 DIAGNOSIS — Z951 Presence of aortocoronary bypass graft: Secondary | ICD-10-CM | POA: Insufficient documentation

## 2013-06-30 MED ORDER — TECHNETIUM TC 99M SESTAMIBI GENERIC - CARDIOLITE
30.9000 | Freq: Once | INTRAVENOUS | Status: AC | PRN
  Administered 2013-06-30: 30.9 via INTRAVENOUS

## 2013-06-30 MED ORDER — REGADENOSON 0.4 MG/5ML IV SOLN
0.4000 mg | Freq: Once | INTRAVENOUS | Status: AC
  Administered 2013-06-30: 0.4 mg via INTRAVENOUS

## 2013-06-30 MED ORDER — AMINOPHYLLINE 25 MG/ML IV SOLN
75.0000 mg | Freq: Once | INTRAVENOUS | Status: AC
  Administered 2013-06-30: 75 mg via INTRAVENOUS

## 2013-06-30 MED ORDER — TECHNETIUM TC 99M SESTAMIBI GENERIC - CARDIOLITE
10.2000 | Freq: Once | INTRAVENOUS | Status: AC | PRN
  Administered 2013-06-30: 10 via INTRAVENOUS

## 2013-06-30 NOTE — Procedures (Addendum)
[facility] [address] [place] [address] [place] [address] [phone]  Cardiology Nuclear Med Study  Peter Mullen is a 79 y.o. male     MRN : [id number]     DOB: April 28, 1932  Procedure Date: 06/30/2013  Nuclear Med Background Indication for Stress Test:  Abnormal EKG History:  CAD;CABG X4-2001;OSA-MILD;MILD AORTIC SCLEROSIS Cardiac Risk Factors: Family History - CAD, Hypertension, Lipids, NIDDM, Obesity and TIA  Symptoms:  Chest Pain, Light-Headedness and Palpitations   Nuclear Pre-Procedure Caffeine/Decaff Intake:  1:00am NPO After: 11am   IV Site: R Hand  IV 0.9% NS with Angio Cath:  22g  Chest Size (in):  44"  IV Started by: Emmit Pomfret, RN  Height: 5\' 8"  (1.727 m)  Cup Size: n/a  BMI:  Body mass index is 30.87 kg/(m^2). Weight:  203 lb (92.08 kg)   Tech Comments:  n/a    Nuclear Med Study 1 or 2 day study: 1 day  Stress Test Type:  Lexiscan  Order Authorizing Provider:  Nicki Guadalajara, MD   Resting Radionuclide: Technetium 88m Sestamibi  Resting Radionuclide Dose: 10.2 mCi   Stress Radionuclide:  Technetium 19m Sestamibi  Stress Radionuclide Dose: 30.4 mCi           Stress Protocol Rest HR:61 Stress HR: 94  Rest BP: 145/81 Stress BP:157/69  Exercise Time (min): n/a METS: n/a          Dose of Adenosine (mg):  n/a Dose of Lexiscan: 0.4 mg  Dose of Atropine (mg): n/a Dose of Dobutamine: n/a mcg/kg/min (at max HR)  Stress Test Technologist: Ernestene Mention, CCT Nuclear Technologist: Gonzella Lex, CNMT   Rest Procedure:  Myocardial perfusion imaging was performed at rest 45 minutes following the intravenous administration of Technetium 63m Sestamibi. Stress Procedure:  The patient received IV Lexiscan 0.4 mg over 15-seconds.  Technetium 35m Sestamibi injected at 30-seconds.  Due to patient's shortness of breath, he was given IV Aminophylline 75 mg. Symptom was resolved during recovery. There were no significant changes  with Lexiscan.  Quantitative spect images were obtained after a 45 minute delay.  Transient Ischemic Dilatation (Normal <1.22):  1.15 Lung/Heart Ratio (Normal <0.45):  0.34 QGS EDV:  117 ml QGS ESV:  62 ml LV Ejection Fraction: 47%  Signed by       Rest ECG: NSR - Normal EKG  Stress ECG: No significant ST segment change suggestive of ischemia. and rare PVC's  QPS Raw Data Images:  Normal; no motion artifact; normal heart/lung ratio. Stress Images:  Mild apical thinning; otherwise,normal homogeneous uptake in all areas of the myocardium. Rest Images:  There is decreased uptake in the inferior wall suggestive of attenuation artifact on resting images. Subtraction (SDS):  No evidence of ischemia.  Impression Exercise Capacity:  Lexiscan with no exercise. BP Response:  Normal blood pressure response. Clinical Symptoms:  No significant symptoms noted. ECG Impression:  No significant ST segment change suggestive of ischemia. Comparison with Prior Nuclear Study: Since prior study apical thinning is new and more attenuation on resting images;EF has decreased from 65% to 47%  Overall Impression:  Low risk stress nuclear study demonstrating apical thinning with increased attenuation on resting images without significant ischemia.  LV Wall Motion:  Mild LV dysfuction with EF of 47% and focal apical inferior hypocontractility.   Lennette Bihari, MD  06/30/2013 4:16 PM

## 2013-07-05 ENCOUNTER — Encounter: Payer: Self-pay | Admitting: Cardiovascular Disease

## 2013-07-05 ENCOUNTER — Ambulatory Visit (INDEPENDENT_AMBULATORY_CARE_PROVIDER_SITE_OTHER): Payer: Medicare Other | Admitting: Cardiovascular Disease

## 2013-07-05 VITALS — BP 140/90 | HR 62 | Ht 66.5 in | Wt 209.0 lb

## 2013-07-05 DIAGNOSIS — R002 Palpitations: Secondary | ICD-10-CM

## 2013-07-05 DIAGNOSIS — E785 Hyperlipidemia, unspecified: Secondary | ICD-10-CM

## 2013-07-05 DIAGNOSIS — E119 Type 2 diabetes mellitus without complications: Secondary | ICD-10-CM

## 2013-07-05 DIAGNOSIS — I10 Essential (primary) hypertension: Secondary | ICD-10-CM

## 2013-07-05 DIAGNOSIS — I2510 Atherosclerotic heart disease of native coronary artery without angina pectoris: Secondary | ICD-10-CM

## 2013-07-05 DIAGNOSIS — G4730 Sleep apnea, unspecified: Secondary | ICD-10-CM

## 2013-07-05 MED ORDER — LOSARTAN POTASSIUM 100 MG PO TABS: 100.0000 mg | ORAL_TABLET | Freq: Every day | ORAL | Status: DC

## 2013-07-05 MED ORDER — LOSARTAN POTASSIUM 100 MG PO TABS: 50.0000 mg | ORAL_TABLET | Freq: Every day | ORAL | Status: DC

## 2013-07-05 NOTE — Patient Instructions (Addendum)
Your physician recommends that you schedule a follow-up appointment in: 2 MONTHS  Your physician has requested that you have an echocardiogram. Echocardiography is a painless test that uses sound waves to create images of your heart. It provides your doctor with information about the size and shape of your heart and how well your heart's chambers and valves are working. This procedure takes approximately one hour. There are no restrictions for this procedure.   Your physician has recommended you make the following change in your medication: increase the losartan to 100 mg.  Your physician recommends that you return for lab work in: 2 WEEKS.

## 2013-07-05 NOTE — Progress Notes (Signed)
Patient ID: Peter Mullen, male   DOB: [birth year], 79 y.o.   MRN: [id number]      HPI: Peter Mullen, is a 79 y.o. male who presents to the office today for 6 month cardiology evaluation.   [name] has established coronary artery disease in March 2001 underwent CABG revascularization surgery with a LIMA to the LAD, SVG to diagonal, SVG to the PLA, and SVG to the PDA vessel. Additional medical problems include hypertension, type 2 diabetes mellitus, obstructive sleep apnea, mild not on CPAP therapy.  A lnuclear perfusion study  in October 2012 which  normal perfusion with a post stress ejection fraction of 62%. He has documented grade 1 diastolic dysfunction EA ratio 9.56.   A 2-D echo imaging with mild aortic sclerosis without stenosis, trace MR and trace TR in October 2012. He has a history of mixed hyperlipidemia. He sees Dr. Ward Chatters for his primary medical care. Recent laboratory done by Dr. Dora Sims the head showed a normal chemistry profile per cholesterol is 93 triglycerides 110 HDL 31 LDL 40 on his current medical regimen.  [name] recently underwent a two-year followup nuclear perfusion study on 06/30/2013. This was low risk nuclear scan demonstrating apical thinning with increased attenuation on resting images without significant ischemia. He did have mild LV dysfunction with an ejection fraction of 47% and focal apical inferior hypocontractility.  Past medical history: CAD, status post CABG, hypertension, type 2 diabetes mellitus, mixed hyperlipidemia, obstructive sleep apnea, mild  Past surgical history notable for CABG surgery, cataract surgery 2005 prostate surgery 1989.  Current Outpatient Prescriptions  Medication Sig Dispense Refill  . Ascorbic Acid (VITAMIN C) 1000 MG tablet Take 1,000 mg by mouth daily.      [name] aspirin 81 MG tablet Take 81 mg by mouth daily.      [name] atorvastatin (LIPITOR) 20 MG tablet Take 20 mg by mouth daily.      [name] BIOTIN 5000 PO Take by mouth.        . cetirizine (ZYRTEC) 10 MG tablet Take 10 mg by mouth daily.      . Choline Fenofibrate (TRILIPIX) 135 MG capsule Take 135 mg by mouth daily.      . Cinnamon 500 MG capsule Take 500 mg by mouth daily.      . clopidogrel (PLAVIX) 75 MG tablet Take 75 mg by mouth daily.      . Cyanocobalamin (VITAMIN B 12 PO) Take 2,500 mcg by mouth.      . Docosahexaenoic Acid (DHA PO) Take 1 capsule by mouth daily.      . folic acid (FOLVITE) 800 MCG tablet Take 400 mcg by mouth daily.      . hydrochlorothiazide (HYDRODIURIL) 25 MG tablet Take 25 mg by mouth daily.      [name] linagliptin (TRADJENTA) 5 MG TABS tablet Take 5 mg by mouth daily.      . metFORMIN (GLUCOPHAGE) 1000 MG tablet Take 1,000 mg by mouth 2 (two) times daily with a meal.      . metoprolol succinate (TOPROL-XL) 100 MG 24 hr tablet Take 100 mg by mouth daily. Take with or immediately following a meal.      . montelukast (SINGULAIR) 10 MG tablet Take 10 mg by mouth at bedtime.      . Omega-3 Fat Ac-Cholecalciferol (MINICAPS VITAMIN-D/OMEGA-3) [phone] MG-UNIT CAPS Take 2,000 Int'l Units by mouth.      . Omega-3 Fatty Acids (EPA PO) Take 1 capsule by mouth daily.      [name]  omeprazole (PRILOSEC) 20 MG capsule Take 20 mg by mouth daily.      [name] OVER THE COUNTER MEDICATION Biotin 5000 mg      . terazosin (HYTRIN) 5 MG capsule Take 5 mg by mouth at bedtime.      [name] testosterone (ANDROGEL) 50 MG/5GM GEL Place 5 g onto the skin daily.      . vitamin E 400 UNIT capsule Take 400 Units by mouth daily.      [name] losartan (COZAAR) 100 MG tablet Take 1 tablet (100 mg total) by mouth daily.  90 tablet  3   No current facility-administered medications for this visit.    He is widowed per history children 4 grandchildren. There is no tobacco use. He does drink occasional alcohol.  ROS negative for fever chills night sweats denies weight loss. Does wear glasses. He has hearing aids per He remains fairly active. He walks regularly and in [place] several days per  week. [place] shortness of breath. He denies wheezing cough or sputum production. He denies presyncope or syncope. He denies palpitations. He denies chest pressure. He does have a hiatal hernia. Next increased gas. He denies nausea vomiting or diarrhea. He denies blood in stool urine. Denies myalgias. He denies arthralgias. He denies edema. There is no diabetes. He is unaware of thyroid issues. He denies neurologic symptoms. Other comprehensive 12 point system review is negative.  PE BP 140/90  Pulse 62  Ht 5' 6.5" (1.689 m)  Wt 209 lb (94.802 kg)  BMI 33.23 kg/m2  Repeat blood pressure was 160/80 when taken by me. General: Alert, oriented, no distress.  HEENT: Normocephalic, atraumatic. Pupils round and reactive; sclera anicteric;no lid lag,  Nose without nasal septal hypertrophy Mouth/Parynx benign; Mallinpatti scale 3 Neck: No JVD, no carotid bruits Lungs: clear to ausculatation and percussion; no wheezing or rales Chest: No chest wall discomfort Heart: RRR, s1 s2 normal  there isolate PVC. 1/6 systolic murmur Abdomen: Mild adiposity;   soft, nontender; no hepatosplenomehaly, BS+; abdominal aorta nontender and not dilated by palpation. Back: No CVA tenderness Pulses 2+ Extremities: no clubbing cyanosis or edema, Homan's sign negative  Neurologic: grossly nonfocal Psychologic: Normal cognitive function, normal affect and mood    LABS:  BMET No results found for this basename: na,  k,  cl,  co2,  glucose,  bun,  creatinine,  calcium,  gfrnonaa,  gfraa     Hepatic Function Panel  No results found for this basename: prot,  albumin,  ast,  alt,  alkphos,  bilitot,  bilidir,  ibili     CBC No results found for this basename: wbc,  rbc,  hgb,  hct,  plt,  mcv,  mch,  mchc,  rdw,  neutrabs,  lymphsabs,  monoabs,  eosabs,  basosabs     BNP No results found for this basename: probnp    Lipid Panel  No results found for this basename: chol,  trig,  hdl,  cholhdl,  vldl,  ldlcalc       RADIOLOGY: No results found.    ASSESSMENT AND PLAN:   [name] is now 10 years status post CABG revascularization surgery. He was planning to go through to bone undergoing a procedure for his brother and at that time a preoperative nuclear perfusion study was abnormal which led to his catheterization and ultimate detection of significant multivessel CAD. Presently, his blood pressure is elevated at 160/88 when rechecked by me. He states his blood pressure at home typically is in  the 160 range. I recommended further titration of his losartan from 50 mg to 100 mg daily. He will have a followup basic metabolic panel on his increased dose of losartan to make certain renal function is adequate. I did review his recent nuclear perfusion study. In comparison to his prior study of 2 years previously ejection fraction has reduced to 47% on stress images with suggestion of additional apical abnormality. I am recommending a followup echo Doppler study for reassessment of LV function. I will see him in 2 months for followup evaluation.     Lennette Bihari, MD, [facility]  07/05/2013 2:51 PM

## 2013-07-06 ENCOUNTER — Encounter: Payer: Self-pay | Admitting: Cardiovascular Disease

## 2013-07-24 LAB — BASIC METABOLIC PANEL
Glucose, Bld: 175 mg/dL — ABNORMAL HIGH (ref 70–99)
Potassium: 4.2 mEq/L (ref 3.5–5.3)
Sodium: 138 mEq/L (ref 135–145)

## 2013-07-31 ENCOUNTER — Other Ambulatory Visit: Payer: Self-pay | Admitting: Cardiovascular Disease

## 2013-09-11 ENCOUNTER — Ambulatory Visit (HOSPITAL_COMMUNITY): Payer: Medicare Other

## 2013-09-18 ENCOUNTER — Ambulatory Visit: Payer: Medicare Other | Admitting: Cardiovascular Disease

## 2013-09-20 ENCOUNTER — Ambulatory Visit (HOSPITAL_COMMUNITY)
Admission: RE | Admit: 2013-09-20 | Discharge: 2013-09-20 | Disposition: A | Discharge status: Home / Self Care | Payer: Medicare Other | Source: Ambulatory Visit | Attending: Cardiovascular Disease | Admitting: Cardiovascular Disease

## 2013-09-20 DIAGNOSIS — I2510 Atherosclerotic heart disease of native coronary artery without angina pectoris: Secondary | ICD-10-CM | POA: Insufficient documentation

## 2013-09-20 DIAGNOSIS — I10 Essential (primary) hypertension: Secondary | ICD-10-CM | POA: Insufficient documentation

## 2013-09-20 DIAGNOSIS — I517 Cardiomegaly: Secondary | ICD-10-CM

## 2013-09-20 DIAGNOSIS — R002 Palpitations: Secondary | ICD-10-CM

## 2013-09-20 DIAGNOSIS — E119 Type 2 diabetes mellitus without complications: Secondary | ICD-10-CM

## 2013-09-20 NOTE — Progress Notes (Signed)
2D echo performed JR 09-20-2013

## 2013-10-11 ENCOUNTER — Ambulatory Visit (INDEPENDENT_AMBULATORY_CARE_PROVIDER_SITE_OTHER): Payer: Medicare Other | Admitting: Cardiovascular Disease

## 2013-10-11 ENCOUNTER — Encounter: Payer: Self-pay | Admitting: Cardiovascular Disease

## 2013-10-11 VITALS — BP 152/80 | HR 73 | Ht 66.5 in | Wt 213.6 lb

## 2013-10-11 DIAGNOSIS — I119 Hypertensive heart disease without heart failure: Secondary | ICD-10-CM

## 2013-10-11 DIAGNOSIS — E785 Hyperlipidemia, unspecified: Secondary | ICD-10-CM

## 2013-10-11 DIAGNOSIS — I10 Essential (primary) hypertension: Secondary | ICD-10-CM

## 2013-10-11 DIAGNOSIS — R002 Palpitations: Secondary | ICD-10-CM

## 2013-10-11 DIAGNOSIS — E119 Type 2 diabetes mellitus without complications: Secondary | ICD-10-CM

## 2013-10-11 DIAGNOSIS — I2510 Atherosclerotic heart disease of native coronary artery without angina pectoris: Secondary | ICD-10-CM

## 2013-10-11 DIAGNOSIS — G4730 Sleep apnea, unspecified: Secondary | ICD-10-CM

## 2013-10-11 DIAGNOSIS — C44319 Basal cell carcinoma of skin of other parts of face: Secondary | ICD-10-CM

## 2013-10-11 NOTE — Progress Notes (Signed)
Patient ID: Peter Mullen, male   DOB: 05/13/1932, 80 y.o.   MRN: [id number]       HPI: Peter Mullen is a 80 y.o. male who presents to the office today for follow-up cardiology evaluation.   [name] has established coronary artery disease and in March 2001 underwent CABG revascularization surgery with a LIMA to the LAD, SVG to diagonal, SVG to the PLA, and SVG to the PDA vessel. Additional medical problems include hypertension, type 2 diabetes mellitus, obstructive sleep apnea, mild not on CPAP therapy.   In October 2012 an nuclear perfusion study showed normal perfusion with a post stress ejection fraction of 62%. He has documented grade 1 diastolic dysfunction EA ratio 0.71.   A 2-D echo irevealed mild aortic sclerosis without stenosis, trace MR and trace TR in October 2012. He has a history of mixed hyperlipidemia. He sees Dr. [name] for his primary medical care. Laboratory showed a normal chemistry profile, cholesterol 93, triglycerides, 110 HDL 31, and LDL 40 on his current medical regimen.  [name]  underwent a two-year followup nuclear perfusion study on 06/30/2013. This was low risk nuclear scan demonstrating apical thinning with increased attenuation on resting images without significant ischemia. He did have mild LV dysfunction with an ejection fraction of 47% and focal apical inferior hypocontractility.    Last saw him, I recommended a followup echo Doppler study to reassess LV function and valvular architecture. This was done on 09/20/2013 and showed an ejection fraction of 55% without diagnostic regional wall motion abnormalities. Was septal bounce. He did have grade 1 diastolic dysfunction. He had moderate calcification of his aortic valve without stenosis and there was moderate calcification of the mitral annulus with mildly calcified leaflets without significant regurgitation. His left atrium was mildly dilated. Head normal pulmonary pressures.  He denies chest pain. He  tells me last week he had a basal cell cancer removed from his left cheek by Dr. Sarajane Jews.  He is unaware of palpitations. He denies presyncope or syncope to   Past medical history: CAD, status post CABG, hypertension, type 2 diabetes mellitus, mixed hyperlipidemia, obstructive sleep apnea, mild  Past surgical history notable for CABG surgery, cataract surgery 2005 prostate surgery 1989.  Current Outpatient Prescriptions  Medication Sig Dispense Refill  . Ascorbic Acid (VITAMIN C) 1000 MG tablet Take 1,000 mg by mouth daily.      [name] aspirin 81 MG tablet Take 81 mg by mouth daily.      [name] atorvastatin (LIPITOR) 20 MG tablet Take 20 mg by mouth daily.      [name] BIOTIN 5000 PO Take by mouth.      . cetirizine (ZYRTEC) 10 MG tablet Take 10 mg by mouth daily.      . Choline Fenofibrate (TRILIPIX) 135 MG capsule Take 135 mg by mouth daily.      . Cinnamon 500 MG capsule Take 500 mg by mouth daily.      . clopidogrel (PLAVIX) 75 MG tablet Take 75 mg by mouth daily.      . Cyanocobalamin (VITAMIN B 12 PO) Take 2,500 mcg by mouth.      . Docosahexaenoic Acid (DHA PO) Take 1 capsule by mouth daily.      . folic acid (FOLVITE) 458 MCG tablet Take 400 mcg by mouth daily.      . hydrochlorothiazide (HYDRODIURIL) 25 MG tablet Take 25 mg by mouth daily.      [name] linagliptin (TRADJENTA) 5 MG TABS tablet Take 5  mg by mouth daily.      [name] losartan (COZAAR) 100 MG tablet Take 1 tablet (100 mg total) by mouth daily.  90 tablet  3  . metFORMIN (GLUCOPHAGE) 1000 MG tablet Take 1,000 mg by mouth 2 (two) times daily with a meal.      . metoprolol succinate (TOPROL-XL) 100 MG 24 hr tablet TAKE ONE TABLET BY MOUTH EVERY DAY  30 tablet  6  . montelukast (SINGULAIR) 10 MG tablet Take 10 mg by mouth at bedtime.      . Omega-3 Fat Ac-Cholecalciferol (MINICAPS VITAMIN-D/OMEGA-3) [phone] MG-UNIT CAPS Take 2,000 Int'l Units by mouth.      [name] omeprazole (PRILOSEC) 20 MG capsule Take 20 mg by mouth daily.      [name] OVER THE COUNTER  MEDICATION Biotin 5000 mg      . terazosin (HYTRIN) 5 MG capsule Take 5 mg by mouth at bedtime.      [name] testosterone (ANDROGEL) 50 MG/5GM GEL Place 5 g onto the skin daily.      . vitamin E 400 UNIT capsule Take 400 Units by mouth daily.       No current facility-administered medications for this visit.    He is widowed per history children 4 grandchildren. There is no tobacco use. He does drink occasional alcohol.  ROS negative for fever chills night sweats denies weight loss. He is status post resection last week of a basal cell carcinoma with clear margins at surgical excision. He does wear glasses. He has hearing aids. He remains fairly active. He walks regularly several days per week.  There is some mild shortness of breath and he went to walk very fast. He denies wheezing cough or sputum production. He denies presyncope or syncope. He denies palpitations. He denies chest pressure. He does have a hiatal hernia. Next increased gas. He denies nausea vomiting or diarrhea. He denies blood in stool urine. Denies myalgias. He denies arthralgias. He denies edema. There is no diabetes. He is unaware of thyroid issues. He denies neurologic symptoms. Other comprehensive 14 point system review is negative.  PE BP 152/80  Pulse 73  Ht 5' 6.5" (1.689 m)  Wt 213 lb 9.6 oz (96.888 kg)  BMI 33.96 kg/m2  Repeat blood pressure by me was 130/80 General: Alert, oriented, no distress.  HEENT: Normocephalic, atraumatic. Pupils round and reactive; sclera anicteric;no lid lag,  Nose without nasal septal hypertrophy Mouth/Parynx benign; Mallinpatti scale 3 Neck: No JVD, no carotid bruits; normal carotid Lungs: clear to ausculatation and percussion; no wheezing or rales Chest: No chest wall discomfort Heart: RRR, s1 s2 normal  there isolate PVC. 6-0/6 systolic murmur; no diastolic murmur. No rubs thrills or heaves. Abdomen: Mild adiposity;   soft, nontender; no hepatosplenomehaly, BS+; abdominal aorta nontender  and not dilated by palpation. Back: No CVA tenderness Pulses 2+ Extremities: no clubbing cyanosis or edema, Homan's sign negative  Neurologic: grossly nonfocal Psychologic: Normal cognitive function, normal affect and mood   ECG: (independently read by me) sinus rhythm at 73 beats per minute. First degree block with PR interval 248 ms. Isolated unifocal PVCs.   LABS:  BMET    Component Value Date/Time   NA 138 07/24/2013 1144     Hepatic Function Panel  No results found for this basename: prot,  albumin,  ast,  alt,  alkphos,  bilitot,  bilidir,  ibili     CBC No results found for this basename: wbc,  rbc,  hgb,  hct,  plt,  mcv,  mch,  mchc,  rdw,  neutrabs,  lymphsabs,  monoabs,  eosabs,  basosabs     BNP No results found for this basename: probnp    Lipid Panel  No results found for this basename: chol,  trig,  hdl,  cholhdl,  vldl,  ldlcalc     RADIOLOGY: No results found.    ASSESSMENT AND PLAN:   [name] is now 14 years status post CABG revascularization surgery.  At that time, a preoperative nuclear perfusion study which was done prior to undergoing potential bone marrow transplantation for his brother was abnormal which led to his catheterization and ultimate detection of significant multivessel CAD. His blood pressure today is improved on his current medical regimen consisting of hydrochlorothiazide 25 mg daily, Toprol-XL 100 mg, terazocin 5 mg, and losartan 100 mg. He continues to be on dual antiplatelet therapy with aspirin and Plavix. He takes Singulair for asthma. He's been on Prilosec for a many years for his GERD and he does have a documented hiatal hernia.[name] He is on Lipitor 20 mg and fenofibrate. His most recent echo Doppler study confirms continued normal LV function which was different from the recent nuclear stress test which showed an ejection fraction of 47%. He did not have definitive wall motion abnormalities. Clinically he is without angina.  He only notes mild shortness of breath with fast walking. He does have diastolic dysfunction grade 1. As long as he remains stable, I will see him in 6 months for followup evaluation or sooner if problems arise.     Troy Sine, MD, [facility]  10/11/2013 6:52 PM

## 2013-10-11 NOTE — Patient Instructions (Signed)
Your physician recommends that you schedule a follow-up appointment in: 6 months. No changes were made today.

## 2013-10-28 ENCOUNTER — Emergency Department (HOSPITAL_COMMUNITY): Payer: Medicare Other

## 2013-10-28 ENCOUNTER — Emergency Department (HOSPITAL_COMMUNITY)
Admission: EM | Admit: 2013-10-28 | Discharge: 2013-10-28 | Disposition: A | Discharge status: Home / Self Care | Payer: Medicare Other | Attending: Emergency Medicine | Admitting: Emergency Medicine

## 2013-10-28 ENCOUNTER — Encounter (HOSPITAL_COMMUNITY): Payer: Self-pay | Admitting: Emergency Medicine

## 2013-10-28 DIAGNOSIS — R5381 Other malaise: Secondary | ICD-10-CM | POA: Insufficient documentation

## 2013-10-28 DIAGNOSIS — E669 Obesity, unspecified: Secondary | ICD-10-CM | POA: Insufficient documentation

## 2013-10-28 DIAGNOSIS — R5383 Other fatigue: Secondary | ICD-10-CM

## 2013-10-28 DIAGNOSIS — E119 Type 2 diabetes mellitus without complications: Secondary | ICD-10-CM | POA: Insufficient documentation

## 2013-10-28 DIAGNOSIS — Z8719 Personal history of other diseases of the digestive system: Secondary | ICD-10-CM | POA: Insufficient documentation

## 2013-10-28 DIAGNOSIS — R197 Diarrhea, unspecified: Secondary | ICD-10-CM | POA: Insufficient documentation

## 2013-10-28 DIAGNOSIS — Z7982 Long term (current) use of aspirin: Secondary | ICD-10-CM | POA: Insufficient documentation

## 2013-10-28 DIAGNOSIS — R52 Pain, unspecified: Secondary | ICD-10-CM | POA: Insufficient documentation

## 2013-10-28 DIAGNOSIS — E860 Dehydration: Secondary | ICD-10-CM | POA: Insufficient documentation

## 2013-10-28 DIAGNOSIS — R6883 Chills (without fever): Secondary | ICD-10-CM | POA: Insufficient documentation

## 2013-10-28 DIAGNOSIS — Z7902 Long term (current) use of antithrombotics/antiplatelets: Secondary | ICD-10-CM | POA: Insufficient documentation

## 2013-10-28 DIAGNOSIS — Z79899 Other long term (current) drug therapy: Secondary | ICD-10-CM | POA: Insufficient documentation

## 2013-10-28 HISTORY — DX: Type 2 diabetes mellitus without complications: E11.9

## 2013-10-28 LAB — URINALYSIS, ROUTINE W REFLEX MICROSCOPIC
Bilirubin Urine: NEGATIVE
Glucose, UA: NEGATIVE mg/dL
HGB URINE DIPSTICK: NEGATIVE
KETONES UR: NEGATIVE mg/dL
Leukocytes, UA: NEGATIVE
NITRITE: NEGATIVE
PROTEIN: NEGATIVE mg/dL
SPECIFIC GRAVITY, URINE: 1.009 (ref 1.005–1.030)
UROBILINOGEN UA: 0.2 mg/dL (ref 0.0–1.0)
pH: 5 (ref 5.0–8.0)

## 2013-10-28 LAB — COMPREHENSIVE METABOLIC PANEL
ALT: 26 U/L (ref 0–53)
AST: 35 U/L (ref 0–37)
Albumin: 3.9 g/dL (ref 3.5–5.2)
Alkaline Phosphatase: 30 U/L — ABNORMAL LOW (ref 39–117)
BILIRUBIN TOTAL: 0.5 mg/dL (ref 0.3–1.2)
BUN: 20 mg/dL (ref 6–23)
CHLORIDE: 101 meq/L (ref 96–112)
CO2: 20 meq/L (ref 19–32)
Calcium: 8.8 mg/dL (ref 8.4–10.5)
Creatinine, Ser: 1.43 mg/dL — ABNORMAL HIGH (ref 0.50–1.35)
GFR calc Af Amer: 52 mL/min — ABNORMAL LOW (ref >90)
GFR, EST NON AFRICAN AMERICAN: 45 mL/min — AB (ref >90)
Glucose, Bld: 135 mg/dL — ABNORMAL HIGH (ref 70–99)
POTASSIUM: 3.9 meq/L (ref 3.7–5.3)
SODIUM: 135 meq/L — AB (ref 137–147)
TOTAL PROTEIN: 7.5 g/dL (ref 6.0–8.3)

## 2013-10-28 LAB — CBC WITH DIFFERENTIAL/PLATELET
BASOS ABS: 0 10*3/uL (ref 0.0–0.1)
BASOS PCT: 1 % (ref 0–1)
EOS ABS: 0.1 10*3/uL (ref 0.0–0.7)
Eosinophils Relative: 2 % (ref 0–5)
HCT: 44.6 % (ref 39.0–52.0)
Hemoglobin: 15.2 g/dL (ref 13.0–17.0)
Lymphocytes Relative: 33 % (ref 12–46)
Lymphs Abs: 1.9 10*3/uL (ref 0.7–4.0)
MCH: 31 pg (ref 26.0–34.0)
MCHC: 34.1 g/dL (ref 30.0–36.0)
MCV: 91 fL (ref 78.0–100.0)
Monocytes Absolute: 1.1 10*3/uL — ABNORMAL HIGH (ref 0.1–1.0)
Monocytes Relative: 20 % — ABNORMAL HIGH (ref 3–12)
NEUTROS PCT: 45 % (ref 43–77)
Neutro Abs: 2.6 10*3/uL (ref 1.7–7.7)
PLATELETS: 230 10*3/uL (ref 150–400)
RBC: 4.9 MIL/uL (ref 4.22–5.81)
RDW: 14.5 % (ref 11.5–15.5)
WBC: 5.8 10*3/uL (ref 4.0–10.5)

## 2013-10-28 LAB — LIPASE, BLOOD: LIPASE: 34 U/L (ref 11–59)

## 2013-10-28 IMAGING — CR DG ABDOMEN 2V
3 series · 3 of 3 positions shown · non-contrast
Comparison: None.

CLINICAL DATA: DIARRHEA

EXAM:
ABDOMEN - 2 VIEW

[w abdomen upright]
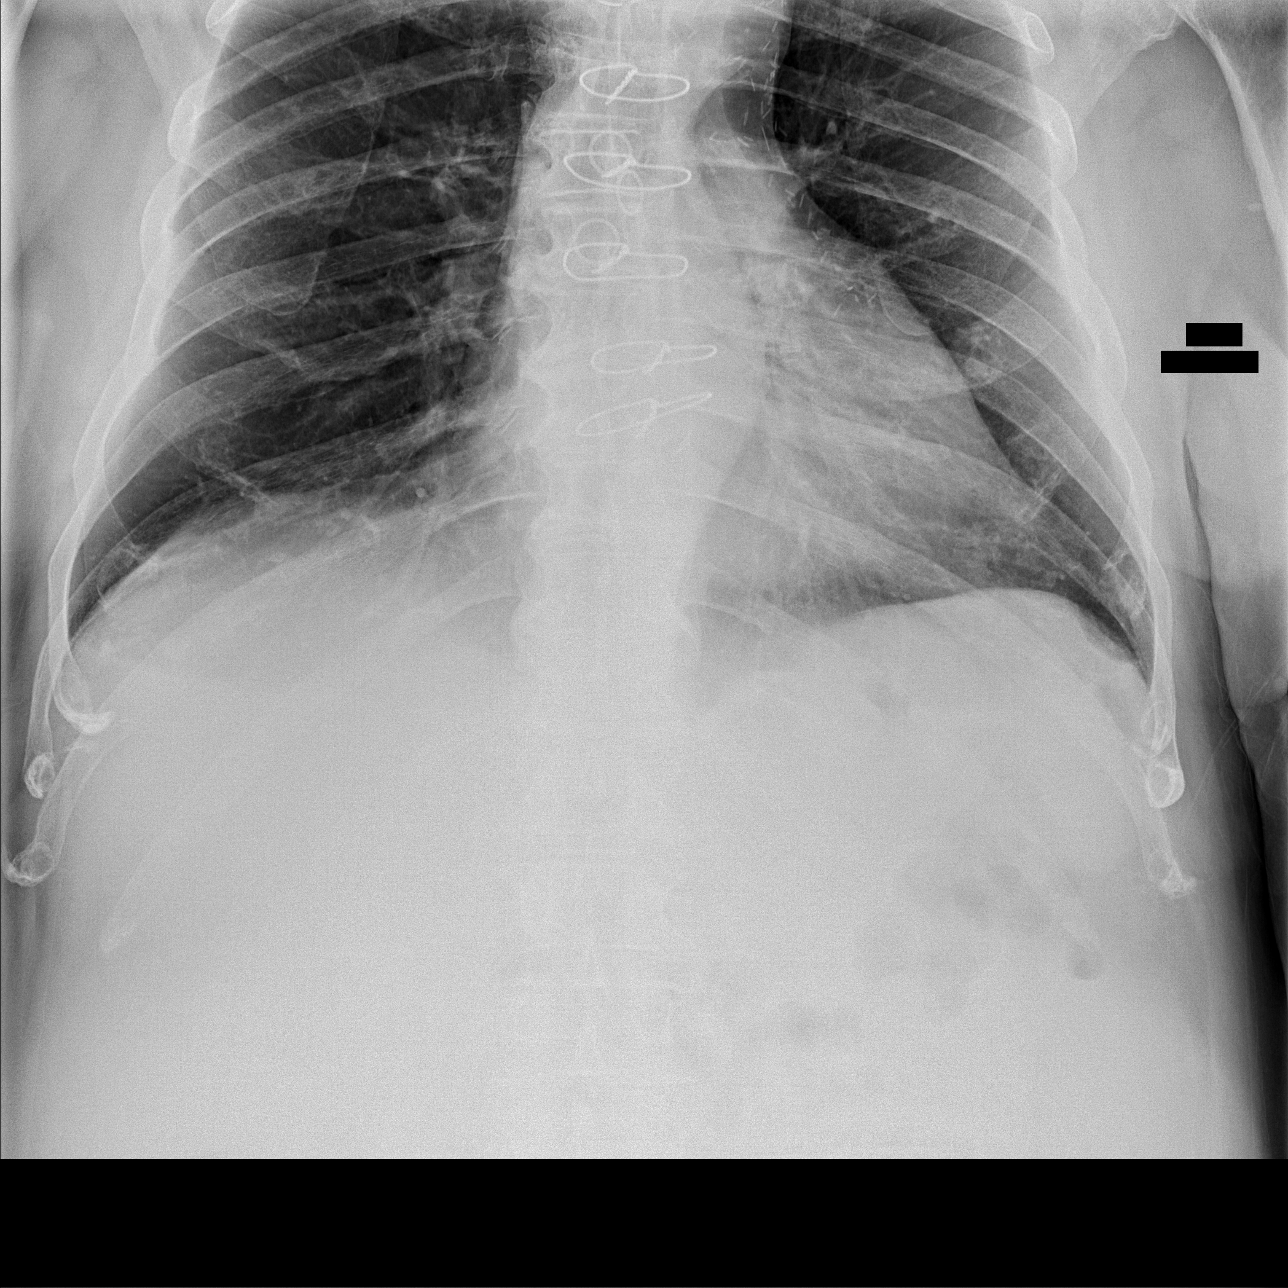

[t abdomen supine (1 of 2)]
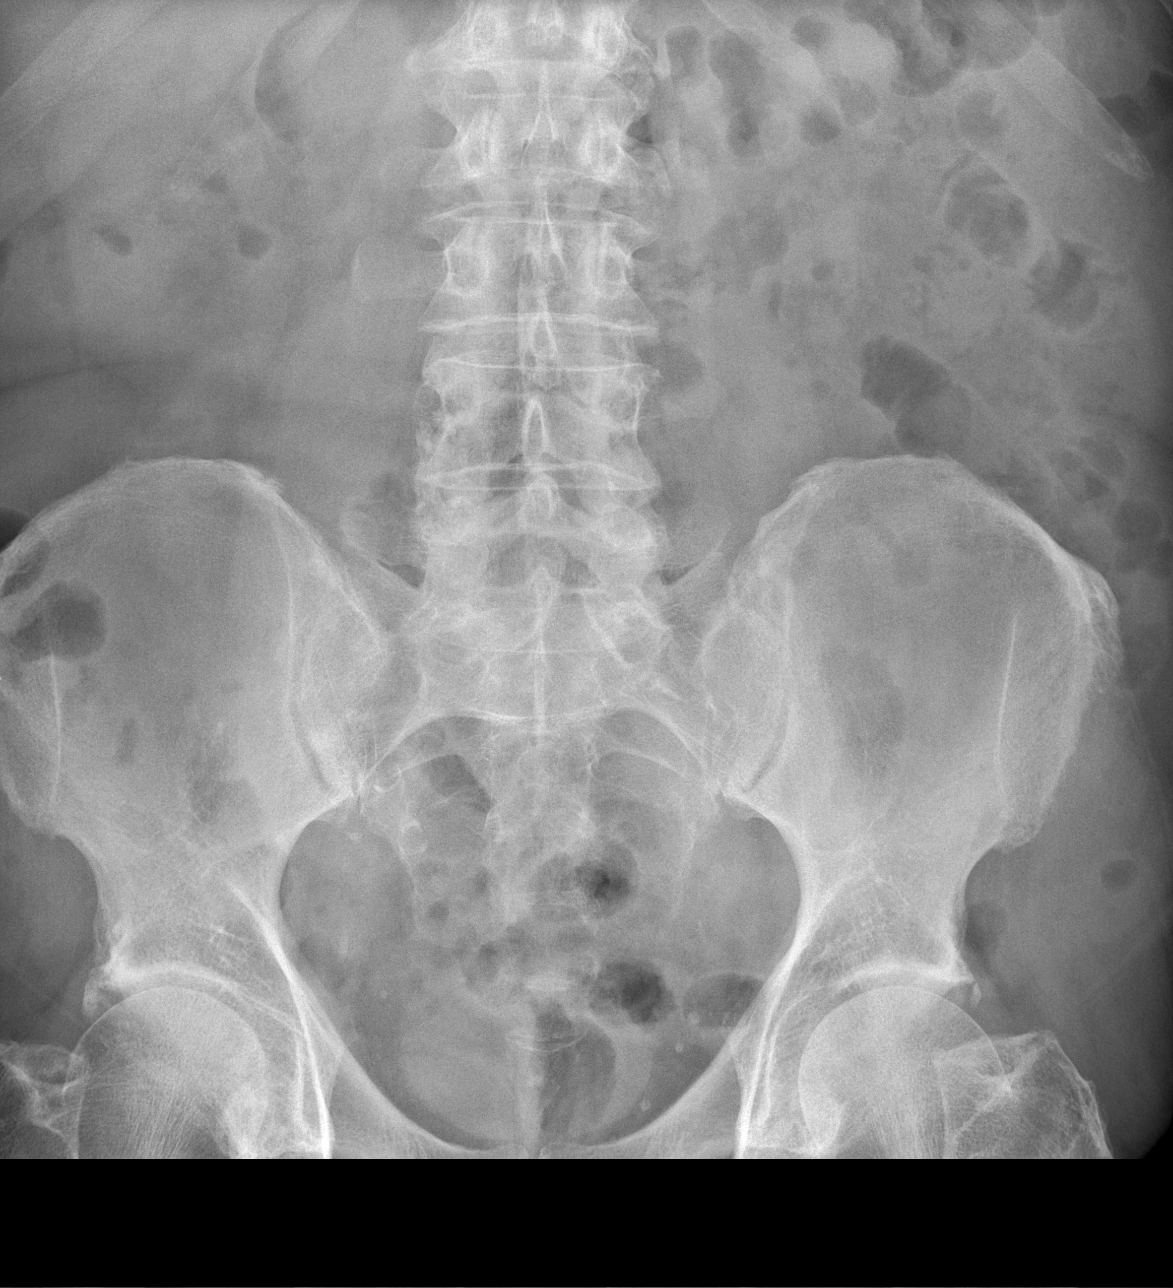

[t abdomen supine (2 of 2)]
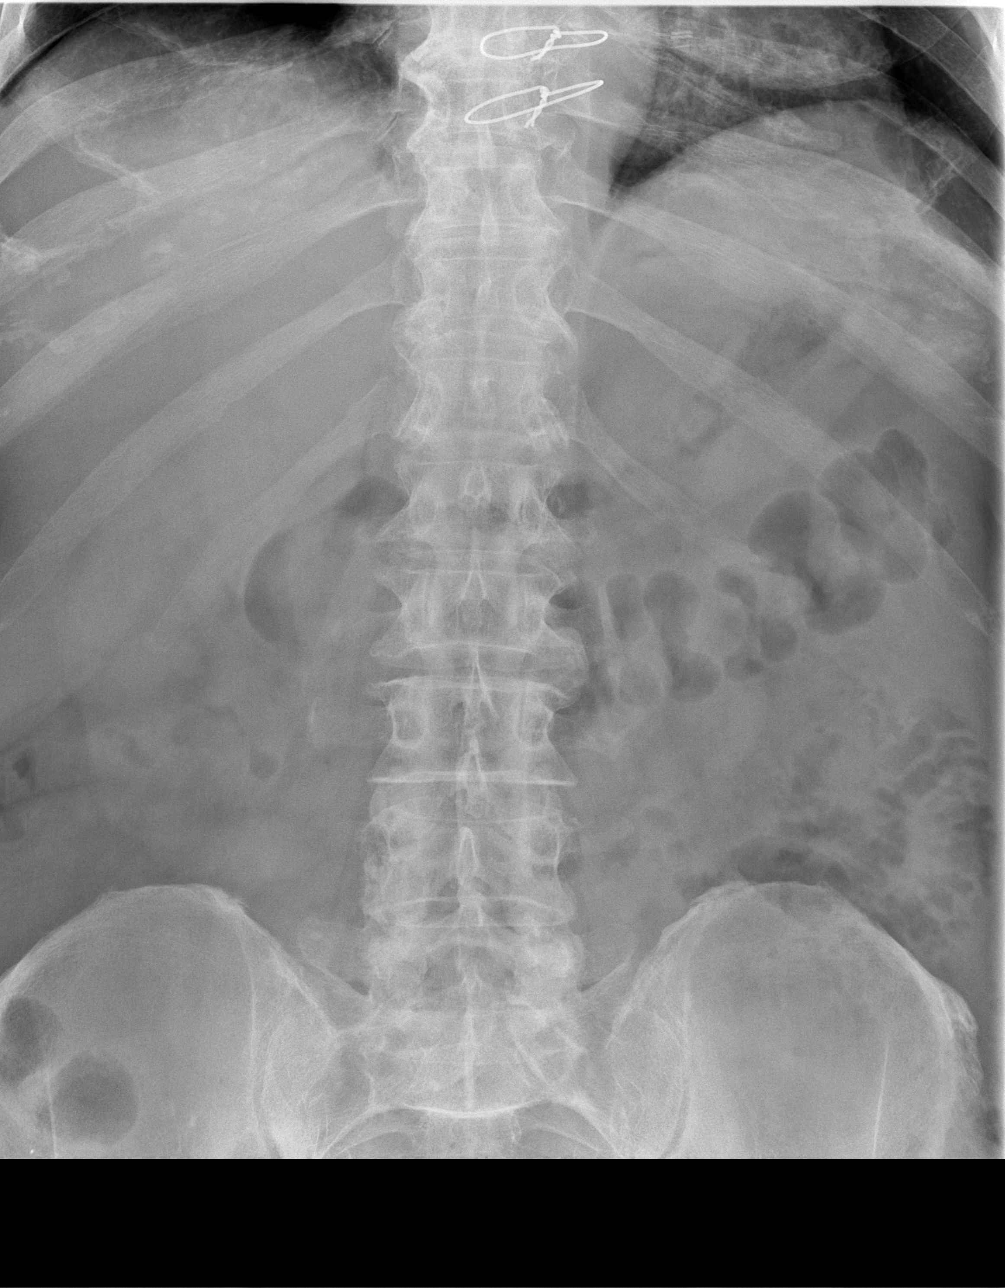

[3 of 3 positions shown; findings below may reference images not displayed]

FINDINGS: The bowel gas pattern is normal. There is no evidence of free air.
No radio-opaque calculi or other significant radiographic
abnormality is seen.
IMPRESSION: Negative.

## 2013-10-28 MED ORDER — DIPHENOXYLATE-ATROPINE 2.5-0.025 MG PO TABS: 1.0000 | ORAL_TABLET | Freq: Four times a day (QID) | ORAL | Status: DC | PRN

## 2013-10-28 MED ORDER — DIPHENOXYLATE-ATROPINE 2.5-0.025 MG PO TABS
1.0000 | ORAL_TABLET | Freq: Once | ORAL | Status: AC
  Administered 2013-10-28: 1 via ORAL
  Filled 2013-10-28: qty 1

## 2013-10-28 MED ORDER — SODIUM CHLORIDE 0.9 % IV BOLUS (SEPSIS)
500.0000 mL | Freq: Once | INTRAVENOUS | Status: AC
  Administered 2013-10-28: 500 mL via INTRAVENOUS

## 2013-10-28 MED ORDER — SODIUM CHLORIDE 0.9 % IV SOLN
Freq: Once | INTRAVENOUS | Status: AC
  Administered 2013-10-28: 18:00:00 via INTRAVENOUS

## 2013-10-28 NOTE — ED Provider Notes (Signed)
Medical screening examination/treatment/procedure(s) were conducted as a shared visit with non-physician practitioner(s) and myself.  I personally evaluated the patient during the encounter.   Please see my separate note.     Houston Siren III, MD 10/28/13 [phone]

## 2013-10-28 NOTE — ED Provider Notes (Signed)
CSN: [id number]     Arrival date & time 10/28/13  1704 History   First MD Initiated Contact with Patient 10/28/13 1713     Chief Complaint  Patient presents with  . Diarrhea     (Consider location/radiation/quality/duration/timing/severity/associated sxs/prior Treatment) HPI Peter Mullen is a 80 y.o. male who presents emergency department complaining of diarrhea and body aches. States his symptoms began 3 days ago after eating dinner. States it began with body aches and generalized malaise. States he woke up that night with watery diarrhea. States he has had persistent diarrhea since. States has about one bowel movement every hour. States he still is light brown, watery. States he took Pepto-Bismol with no relief. States he took Imodium which has slowed down his bowel movements yesterday, but states today he continued to have him every hour. States he is worried he may be getting dehydrated. He denies any abdominal pain. He denies any fever. Is nausea or vomiting. He is able to eat. He has history of diverticulitis, spastic colon, otherwise no abdominal problems and no abdominal surgeries.  Pt denies any associated problems, no headache, dizziness, lightheadiness, no blood in stool, no chest pain or sob. No recent ill contacts   Past Medical History  Diagnosis Date  . Diabetes mellitus without complication    No past surgical history on file. No family history on file. History  Substance Use Topics  . Smoking status: Never Smoker   . Smokeless tobacco: Never Used  . Alcohol Use: 1.0 oz/week    2 drink(s) per week    Review of Systems  Constitutional: Positive for chills. Negative for fever.  Respiratory: Negative for cough, chest tightness and shortness of breath.   Cardiovascular: Negative for chest pain, palpitations and leg swelling.  Gastrointestinal: Positive for diarrhea. Negative for nausea, vomiting, abdominal pain and abdominal distention.  Genitourinary: Negative for  dysuria, urgency, frequency and hematuria.  Musculoskeletal: Positive for myalgias. Negative for arthralgias, neck pain and neck stiffness.  Skin: Negative for rash.  Allergic/Immunologic: Negative for immunocompromised state.  Neurological: Negative for dizziness, weakness, light-headedness, numbness and headaches.  All other systems reviewed and are negative.      Allergies  Codeine and Nsaids  Home Medications   Current Outpatient Rx  Name  Route  Sig  Dispense  Refill  . Ascorbic Acid (VITAMIN C) 1000 MG tablet   Oral   Take 1,000 mg by mouth daily.         [name] aspirin 81 MG tablet   Oral   Take 81 mg by mouth daily.         [name] atorvastatin (LIPITOR) 20 MG tablet   Oral   Take 20 mg by mouth daily.         [name] BIOTIN 5000 PO   Oral   Take by mouth.         . cetirizine (ZYRTEC) 10 MG tablet   Oral   Take 10 mg by mouth daily.         . Choline Fenofibrate (TRILIPIX) 135 MG capsule   Oral   Take 135 mg by mouth daily.         . Cinnamon 500 MG capsule   Oral   Take 500 mg by mouth daily.         . clopidogrel (PLAVIX) 75 MG tablet   Oral   Take 75 mg by mouth daily.         . Cyanocobalamin (VITAMIN B 12 PO)  Oral   Take 2,500 mcg by mouth.         . Docosahexaenoic Acid (DHA PO)   Oral   Take 1 capsule by mouth daily.         . folic acid (FOLVITE) 109 MCG tablet   Oral   Take 400 mcg by mouth daily.         . hydrochlorothiazide (HYDRODIURIL) 25 MG tablet   Oral   Take 25 mg by mouth daily.         [name] linagliptin (TRADJENTA) 5 MG TABS tablet   Oral   Take 5 mg by mouth daily.         [name] losartan (COZAAR) 100 MG tablet   Oral   Take 1 tablet (100 mg total) by mouth daily.   90 tablet   3   . metFORMIN (GLUCOPHAGE) 1000 MG tablet   Oral   Take 1,000 mg by mouth 2 (two) times daily with a meal.         . metoprolol succinate (TOPROL-XL) 100 MG 24 hr tablet      TAKE ONE TABLET BY MOUTH EVERY DAY   30 tablet    6   . montelukast (SINGULAIR) 10 MG tablet   Oral   Take 10 mg by mouth at bedtime.         . Omega-3 Fat Ac-Cholecalciferol (MINICAPS VITAMIN-D/OMEGA-3) [id number] MG-UNIT CAPS   Oral   Take 2,000 Int'l Units by mouth.         [name] omeprazole (PRILOSEC) 20 MG capsule   Oral   Take 20 mg by mouth daily.         [name] OVER THE COUNTER MEDICATION      Biotin 5000 mg         . terazosin (HYTRIN) 5 MG capsule   Oral   Take 5 mg by mouth at bedtime.         [name] testosterone (ANDROGEL) 50 MG/5GM GEL   Transdermal   Place 5 g onto the skin daily.         . vitamin E 400 UNIT capsule   Oral   Take 400 Units by mouth daily.          BP 155/88  Pulse 82  Temp(Src) 97.5 F (36.4 C) (Oral)  Resp 16  SpO2 100% Physical Exam  Nursing note and vitals reviewed. Constitutional: He appears well-developed and well-nourished. No distress.  HENT:  Head: Normocephalic and atraumatic.  Eyes: Conjunctivae are normal.  Neck: Neck supple.  Cardiovascular: Normal rate, regular rhythm and normal heart sounds.   Pulmonary/Chest: Effort normal. No respiratory distress. He has no wheezes. He has no rales.  Abdominal: Soft. Bowel sounds are normal. He exhibits no distension. There is no tenderness. There is no rebound and no guarding.  obese  Musculoskeletal: He exhibits no edema.  Neurological: He is alert.  Skin: Skin is warm and dry.    ED Course  Procedures (including critical care time) Labs Review Labs Reviewed  CBC WITH DIFFERENTIAL - Abnormal; Notable for the following:    Monocytes Relative 20 (*)    Monocytes Absolute 1.1 (*)    All other components within normal limits  COMPREHENSIVE METABOLIC PANEL - Abnormal; Notable for the following:    Sodium 135 (*)    Glucose, Bld 135 (*)    Creatinine, Ser 1.43 (*)    Alkaline Phosphatase 30 (*)    GFR calc non Af Amer 45 (*)  GFR calc Af Amer 52 (*)    All other components within normal limits  CLOSTRIDIUM DIFFICILE BY  PCR  LIPASE, BLOOD  URINALYSIS, ROUTINE W REFLEX MICROSCOPIC   Imaging Review Dg Abd 2 Views  10/28/2013   CLINICAL DATA:  DIARRHEA  EXAM: ABDOMEN - 2 VIEW  COMPARISON:  None.  FINDINGS: The bowel gas pattern is normal. There is no evidence of free air. No radio-opaque calculi or other significant radiographic abnormality is seen.  IMPRESSION: Negative.   Electronically Signed   By: Margaree Mackintosh M.D.   On: 10/28/2013 18:03     EKG Interpretation None      MDM   Final diagnoses:  Diarrhea    Patient with diarrhea, no fever, chills, vomiting, abdominal pain. Diarrhea has been persistent for 3 days. Will get labs, urine sample, stool sample if possible.   Patient rehydrated in emergency department with 1 L of normal saline. He is feeling better. His electrolytes are normal other than slightly low sodium of 135. His creatinine is 1.43, creatinine from 4 months ago for similar and 1.37. Patient is tolerating by mouth fluids in emergency department. His abdomen is benign. He was unable to provide [place] with a stool sample. Urine looks normal. We'll discharge home with a primary care followup. Lomotil for diarrhea at home.  Discussed with Dr. Doy Mince who has seen pt as well. Agrees with the plan  Filed Vitals:   10/28/13 1711 10/28/13 1937  BP: 155/88 118/45  Pulse: 82 64  Temp: 97.5 F (36.4 C) 98 F (36.7 C)  TempSrc: Oral Oral  Resp: 16   SpO2: 100% 99%      Renold Genta, PA-C 10/28/13 2044

## 2013-10-28 NOTE — ED Provider Notes (Signed)
Medical screening examination/treatment/procedure(s) were conducted as a shared visit with non-physician practitioner(s) and myself.  I personally evaluated the patient during the encounter.   EKG Interpretation None      80 year old male with 2 days of diarrhea. No abdominal pain during his course of illness. He was worried he was dehydrated, so presented to the emergency department. On my exam, sitting upright in bed, not distressed, normal respiratory effort, normal perfusion, abdomen soft and nontender with no rebound, rigidity, or guarding. He stated he felt much better after IV fluids. His family felt that he looked much better after IV fluids. He does not appear to have any indication for imaging or admission. He appears stable for outpatient management. Given return precautions.  Clinical Impression: 1. Diarrhea       Peter Siren III, MD 10/28/13 [phone]

## 2013-10-28 NOTE — ED Notes (Signed)
PT states that he began to have gen body aches on Wednesday about 1900.  Felt like he was getting the flu. Woke up the next morning with severe diarrhea about 0500.  Has been having same since.  Denies pain.  Denies vomiting.  Passing a lot of gas.

## 2013-10-28 NOTE — ED Notes (Signed)
Pt aware of the need for a urine sample. Urinal at bedside. 

## 2013-10-28 NOTE — ED Notes (Signed)
Pt in XRAY 

## 2013-10-28 NOTE — Discharge Instructions (Signed)
Continue to drink plenty of fluids. Lomotil for diarrhea. Follow up with primary care doctor if not improving.   Diarrhea Diarrhea is frequent loose and watery bowel movements. It can cause you to feel weak and dehydrated. Dehydration can cause you to become tired and thirsty, have a dry mouth, and have decreased urination that often is dark yellow. Diarrhea is a sign of another problem, most often an infection that will not last long. In most cases, diarrhea typically lasts 2 3 days. However, it can last longer if it is a sign of something more serious. It is important to treat your diarrhea as directed by your caregive to lessen or prevent future episodes of diarrhea. CAUSES  Some common causes include:  Gastrointestinal infections caused by viruses, bacteria, or parasites.  Food poisoning or food allergies.  Certain medicines, such as antibiotics, chemotherapy, and laxatives.  Artificial sweeteners and fructose.  Digestive disorders. HOME CARE INSTRUCTIONS  Ensure adequate fluid intake (hydration): have 1 cup (8 oz) of fluid for each diarrhea episode. Avoid fluids that contain simple sugars or sports drinks, fruit juices, whole milk products, and sodas. Your urine should be clear or pale yellow if you are drinking enough fluids. Hydrate with an oral rehydration solution that you can purchase at pharmacies, retail stores, and online. You can prepare an oral rehydration solution at home by mixing the following ingredients together:    tsp table salt.   tsp baking soda.   tsp salt substitute containing potassium chloride.  1  tablespoons sugar.  1 L (34 oz) of water.  Certain foods and beverages may increase the speed at which food moves through the gastrointestinal (GI) tract. These foods and beverages should be avoided and include:  Caffeinated and alcoholic beverages.  High-fiber foods, such as raw fruits and vegetables, nuts, seeds, and whole grain breads and cereals.  Foods  and beverages sweetened with sugar alcohols, such as xylitol, sorbitol, and mannitol.  Some foods may be well tolerated and may help thicken stool including:  Starchy foods, such as rice, toast, pasta, low-sugar cereal, oatmeal, grits, baked potatoes, crackers, and bagels.  Bananas.  Applesauce.  Add probiotic-rich foods to help increase healthy bacteria in the GI tract, such as yogurt and fermented milk products.  Wash your hands well after each diarrhea episode.  Only take over-the-counter or prescription medicines as directed by your caregiver.  Take a warm bath to relieve any burning or pain from frequent diarrhea episodes. SEEK IMMEDIATE MEDICAL CARE IF:   You are unable to keep fluids down.  You have persistent vomiting.  You have blood in your stool, or your stools are black and tarry.  You do not urinate in 6 8 hours, or there is only a small amount of very dark urine.  You have abdominal pain that increases or localizes.  You have weakness, dizziness, confusion, or lightheadedness.  You have a severe headache.  Your diarrhea gets worse or does not get better.  You have a fever or persistent symptoms for more than 2 3 days.  You have a fever and your symptoms suddenly get worse. MAKE SURE YOU:   Understand these instructions.  Will watch your condition.  Will get help right away if you are not doing well or get worse. Document Released: 07/03/2002 Document Revised: 06/29/2012 Document Reviewed: 03/20/2012 [facility] Patient Information 2014 [place], [place].

## 2014-02-21 ENCOUNTER — Other Ambulatory Visit: Payer: Self-pay | Admitting: Cardiovascular Disease

## 2014-02-26 NOTE — Telephone Encounter (Signed)
Rx was sent to pharmacy electronically. 

## 2014-03-29 ENCOUNTER — Other Ambulatory Visit: Payer: Self-pay | Admitting: Cardiovascular Disease

## 2014-03-29 NOTE — Telephone Encounter (Signed)
Rx was sent to pharmacy electronically. 

## 2014-05-29 ENCOUNTER — Encounter: Payer: Self-pay | Admitting: Cardiovascular Disease

## 2014-05-29 ENCOUNTER — Ambulatory Visit (INDEPENDENT_AMBULATORY_CARE_PROVIDER_SITE_OTHER): Payer: Medicare Other | Admitting: Cardiovascular Disease

## 2014-05-29 VITALS — BP 140/80 | HR 75 | Ht 66.0 in | Wt 205.4 lb

## 2014-05-29 DIAGNOSIS — G4730 Sleep apnea, unspecified: Secondary | ICD-10-CM

## 2014-05-29 DIAGNOSIS — E669 Obesity, unspecified: Secondary | ICD-10-CM

## 2014-05-29 DIAGNOSIS — I10 Essential (primary) hypertension: Secondary | ICD-10-CM

## 2014-05-29 DIAGNOSIS — I2510 Atherosclerotic heart disease of native coronary artery without angina pectoris: Secondary | ICD-10-CM

## 2014-05-29 NOTE — Patient Instructions (Signed)
Your physician wants you to follow-up in: 6 months or sooner if needed with Dr. Kelly. No changes were made today in your therapy. You will receive a reminder letter in the mail two months in advance. If you don't receive a letter, please call our office to schedule the follow-up appointment. 

## 2014-05-29 NOTE — Progress Notes (Signed)
Patient ID: Peter Mullen, male   DOB: March 19, 1932, 80 y.o.   MRN: [id number]     HPI: Peter Mullen is a 80 y.o. male who presents to the office today for an 8 follow-up cardiology evaluation.   [name] has established coronary artery disease and in March 2001 underwent CABG revascularization surgery with a LIMA to the LAD, SVG to diagonal, SVG to the PLA, and SVG to the PDA vessel. Additional medical problems include hypertension, type 2 diabetes mellitus, mild obstructive sleep apnea not on CPAP therapy and obesity.   In October 2012 an nuclear perfusion study showed normal perfusion with a post stress ejection fraction of 62%. He has documented grade 1 diastolic dysfunction EA ratio 0.71.   A 2-D echo irevealed mild aortic sclerosis without stenosis, trace MR and trace TR. He has a history of mixed hyperlipidemia. He sees Dr. [name] for his primary medical care. Laboratory showed a normal chemistry profile, cholesterol 93, triglycerides, 110 HDL 31, and LDL 40 on his current medical regimen.  [name]  underwent a two-year followup nuclear perfusion study on 06/30/2013. This was low risk nuclear scan demonstrating apical thinning with increased attenuation on resting images without significant ischemia. He did have mild LV dysfunction with an ejection fraction of 47% and focal apical inferior hypocontractility.    On 09/20/2013 a follow-up echo Doppler study showed an ejection fraction of 55% without diagnostic regional wall motion abnormalities although septal bounce was noted. He had grade 1 diastolic dysfunction. He had moderate calcification of his aortic valve without stenosis and there was moderate calcification of the mitral annulus with mildly calcified leaflets without significant regurgitation. His left atrium was mildly dilated. He had normal pulmonary pressures.  He is status post removal of a a basal cell cancer removed from his left cheek by Dr. Sarajane Jews.   He denies  chest pain.  He denies palpitations.  He denies shortness of breath.  He bowls and admits that he is not walking as much as he should due to some degenerative lumbar stenosis issues.  He'll be having blood work done next month by Dr. Rosana Hoes. He is unaware of palpitations. He denies presyncope or syncope to   Past medical history: CAD, status post CABG, hypertension, type 2 diabetes mellitus, mixed hyperlipidemia, obstructive sleep apnea, mild  Past surgical history notable for CABG surgery, cataract surgery 2005 prostate surgery 1989.  Current Outpatient Prescriptions  Medication Sig Dispense Refill  . Ascorbic Acid (VITAMIN C) 1000 MG tablet Take 1,000 mg by mouth daily.    [name] aspirin 81 MG tablet Take 81 mg by mouth daily.    [name] atorvastatin (LIPITOR) 20 MG tablet Take 20 mg by mouth daily.    [name] BIOTIN 5000 PO Take by mouth.    . cetirizine (ZYRTEC) 10 MG tablet Take 10 mg by mouth daily.    . Choline Fenofibrate (TRILIPIX) 135 MG capsule Take 135 mg by mouth daily.    . Cinnamon 500 MG capsule Take 500 mg by mouth daily.    . clopidogrel (PLAVIX) 75 MG tablet Take 75 mg by mouth daily.    . Cyanocobalamin (VITAMIN B 12 PO) Take 2,500 mcg by mouth.    . diphenoxylate-atropine (LOMOTIL) 2.5-0.025 MG per tablet Take 1 tablet by mouth 4 (four) times daily as needed for diarrhea or loose stools. 30 tablet 0  . Docosahexaenoic Acid (DHA PO) Take 1 capsule by mouth daily.    . folic acid (FOLVITE) 384 MCG tablet  Take 400 mcg by mouth daily.    . hydrochlorothiazide (HYDRODIURIL) 25 MG tablet Take 25 mg by mouth daily.    [name] linagliptin (TRADJENTA) 5 MG TABS tablet Take 5 mg by mouth daily.    [name] losartan (COZAAR) 100 MG tablet Take 1 tablet (100 mg total) by mouth daily. 90 tablet 3  . metFORMIN (GLUCOPHAGE) 1000 MG tablet Take 1,000 mg by mouth 2 (two) times daily with a meal.    . metoprolol succinate (TOPROL-XL) 100 MG 24 hr tablet TAKE ONE TABLET BY MOUTH ONCE DAILY 30 tablet 6  . Omega-3 Fat  Ac-Cholecalciferol (MINICAPS VITAMIN-D/OMEGA-3) [id number] MG-UNIT CAPS Take 2,000 Int'l Units by mouth.    [name] omeprazole (PRILOSEC) 20 MG capsule Take 20 mg by mouth 2 (two) times daily before a meal.    . testosterone (ANDROGEL) 50 MG/5GM GEL Place 5 g onto the skin daily.    . vitamin E 400 UNIT capsule Take 400 Units by mouth daily.    . montelukast (SINGULAIR) 10 MG tablet Take 1 tablet by mouth at bedtime.    [name] terazosin (HYTRIN) 5 MG capsule Take 1 capsule by mouth at bedtime.     No current facility-administered medications for this visit.    He is widowed per history children 4 grandchildren. There is no tobacco use. He does drink occasional alcohol.  ROS General: Negative; No fevers, chills, or night sweats;  HEENT: Negative; No changes in vision or hearing, sinus congestion, difficulty swallowing Pulmonary: Negative; No cough, wheezing, shortness of breath, hemoptysis Cardiovascular: Negative; No chest pain, presyncope, syncope, palpitations GI: Negative; No nausea, vomiting, diarrhea, or abdominal pain GU: Negative; No dysuria, hematuria, or difficulty voiding Musculoskeletal: Negative; no myalgias, joint pain, or weakness Hematologic/Oncology: Negative; no easy bruising, bleeding Endocrine: Negative; no heat/cold intolerance; no diabetes Neuro: Negative; no changes in balance, headaches Skin: history of removal of prior basal cell carcinoma with clear margins; No rashes or skin lesions Psychiatric: Negative; No behavioral problems, depression Sleep: mild sleep apnea, not on CPAP therapy; No  daytime sleepiness, hypersomnolence, bruxism, restless legs, hypnogognic hallucinations, no cataplexy Other comprehensive 14 point system review is negative.   PE BP 140/80 mmHg  Pulse 75  Ht 5\' 6"  (1.676 m)  Wt 205 lb 6.4 oz (93.169 kg)  BMI 33.17 kg/m2  Repeat blood pressure by me was 130/80 General: Alert, oriented, no distress.  HEENT: Normocephalic, atraumatic. Pupils round and  reactive; sclera anicteric;no lid lag,  Nose without nasal septal hypertrophy Mouth/Parynx benign; Mallinpatti scale 3 Neck: No JVD, no carotid bruits; normal carotid Lungs: clear to ausculatation and percussion; no wheezing or rales Chest: No chest wall discomfort Heart: RRR, s1 s2 normal  there isolate PVC. 8-5/8 systolic murmur; no diastolic murmur. No rubs thrills or heaves. Abdomen: moderate central adiposity;   soft, nontender; no hepatosplenomehaly, BS+; abdominal aorta nontender and not dilated by palpation. Back: No CVA tenderness Pulses 2+ Extremities: no clubbing cyanosis or edema, Homan's sign negative  Neurologic: grossly nonfocal Psychologic: Normal cognitive function, normal affect and mood  ECG (independently read by me): normal sinus rhythm with first-degree AV block with a PR interval at 230 milliseconds.  Prior March 2015ECG: (independently read by me) sinus rhythm at 73 beats per minute. First degree block with PR interval 248 ms. Isolated unifocal PVCs.   LABS:  BMET    Component Value Date/Time   NA 135* 10/28/2013 1742     Hepatic Function Panel     Component Value Date/Time  PROT 7.5 10/28/2013 1742     CBC    Component Value Date/Time   WBC 5.8 10/28/2013 1742     BNP No results found for: PROBNP  Lipid Panel  No results found for: CHOL   RADIOLOGY: No results found.    ASSESSMENT AND PLAN:   [name] is 9 1/2 years status post CABG revascularization surgery.  At that time, a preoperative nuclear perfusion study which was done prior to him undergoing potential bone marrow transplantation for his brother was abnormal and led to his catheterization and ultimate detection of significant multivessel CAD. He was asymptomatic at that time without chest pain. His blood pressure today remains stable and on repeat by me was 132/74 on his current medical regimen consisting of hydrochlorothiazide 25 mg daily, Toprol-XL 100 mg, terazocin 5 mg,  and losartan 100 mg. He continues to be on dual antiplatelet therapy with aspirin and Plavix. He takes Singulair for asthma. He's been on Prilosec for a many years for his GERD and he does have a documented hiatal hernia.[name] He is on Lipitor 20 mg and fenofibrate 135 mg for mixed hyperlipidemia. His last  echo Doppler study confirms continued normal LV function which was different from the recent nuclear stress test which showed an ejection fraction of 47%. He did not have definitive wall motion abnormalities. Clinically he is without angina. He only notes mild shortness of breath with fast walking. He can participate in the silver sneaker program through his insurance.  He does have lumbar stenosis for which he has seen Dr. [name] which has  limited his walking. He has mild obesity.  He has lost 8 pounds since his last office visit.  I've encouraged additional weight reduction and exercise.He does have diastolic dysfunction grade 1. As long as he remains stable, I will see him in 6 months for followup evaluation or sooner if problems arise.     Troy Sine, MD, [facility]  05/29/2014 2:08 PM

## 2014-06-05 ENCOUNTER — Encounter: Payer: Self-pay | Admitting: Cardiovascular Disease

## 2014-07-31 ENCOUNTER — Other Ambulatory Visit: Payer: Self-pay | Admitting: Cardiovascular Disease

## 2014-07-31 NOTE — Telephone Encounter (Signed)
Rx(s) sent to pharmacy electronically.  

## 2014-10-22 ENCOUNTER — Telehealth: Payer: Self-pay | Admitting: Cardiovascular Disease

## 2014-10-29 NOTE — Telephone Encounter (Signed)
Close encounter 

## 2014-11-15 ENCOUNTER — Other Ambulatory Visit: Payer: Self-pay | Admitting: Cardiovascular Disease

## 2014-11-15 NOTE — Telephone Encounter (Signed)
Rx(s) sent to pharmacy electronically.  

## 2014-11-26 ENCOUNTER — Ambulatory Visit (INDEPENDENT_AMBULATORY_CARE_PROVIDER_SITE_OTHER): Payer: Medicare Other | Admitting: Cardiovascular Disease

## 2014-11-26 ENCOUNTER — Encounter: Payer: Self-pay | Admitting: Cardiovascular Disease

## 2014-11-26 VITALS — BP 148/70 | HR 47 | Ht 66.0 in | Wt 206.7 lb

## 2014-11-26 DIAGNOSIS — I441 Atrioventricular block, second degree: Secondary | ICD-10-CM

## 2014-11-26 DIAGNOSIS — I25810 Atherosclerosis of coronary artery bypass graft(s) without angina pectoris: Secondary | ICD-10-CM

## 2014-11-26 DIAGNOSIS — I119 Hypertensive heart disease without heart failure: Secondary | ICD-10-CM | POA: Diagnosis not present

## 2014-11-26 DIAGNOSIS — E66811 Obesity, class 1: Secondary | ICD-10-CM

## 2014-11-26 DIAGNOSIS — E785 Hyperlipidemia, unspecified: Secondary | ICD-10-CM

## 2014-11-26 DIAGNOSIS — G4730 Sleep apnea, unspecified: Secondary | ICD-10-CM | POA: Diagnosis not present

## 2014-11-26 DIAGNOSIS — E669 Obesity, unspecified: Secondary | ICD-10-CM

## 2014-11-26 DIAGNOSIS — I10 Essential (primary) hypertension: Secondary | ICD-10-CM

## 2014-11-26 DIAGNOSIS — E118 Type 2 diabetes mellitus with unspecified complications: Secondary | ICD-10-CM

## 2014-11-26 MED ORDER — METOPROLOL SUCCINATE ER 50 MG PO TB24: 50.0000 mg | ORAL_TABLET | Freq: Every day | ORAL | Status: DC

## 2014-11-26 NOTE — Patient Instructions (Signed)
Your physician has recommended you make the following change in your medication: decrease the metoprolol to 50 mg daily.   Your physician recommends that you schedule a follow-up appointment in: 3 months.

## 2014-11-27 ENCOUNTER — Encounter: Payer: Self-pay | Admitting: Cardiovascular Disease

## 2014-11-27 DIAGNOSIS — I441 Atrioventricular block, second degree: Secondary | ICD-10-CM | POA: Insufficient documentation

## 2014-11-27 NOTE — Progress Notes (Signed)
Patient ID: Peter Mullen, male   DOB: April 07, 1932, 81 y.o.   MRN: [id number]     HPI: Peter Mullen is a 81 y.o. male who presents to the office today for an 7 month follow-up cardiology evaluation.   [name] has established CAD and in March 2001 underwent CABG revascularization surgery with a LIMA to the LAD, SVG to diagonal, SVG to the PLA, and SVG to the PDA vessel. Additional medical problems include hypertension, type 2 diabetes mellitus, mild obstructive sleep apnea not on CPAP therapy and obesity.   In October 2012 an nuclear perfusion study showed normal perfusion with a post stress ejection fraction of 62%. He has documented grade 1 diastolic dysfunction EA ratio 0.71.   A 2-D echo irevealed mild aortic sclerosis without stenosis, trace MR and trace TR. He has a history of mixed hyperlipidemia. He sees Dr. [name] for his primary medical care. Laboratory showed a normal chemistry profile, cholesterol 93, triglycerides, 110 HDL 31, and LDL 40 on his current medical regimen.  [name]  underwent a two-year followup nuclear perfusion study on 06/30/2013. This was low risk nuclear scan demonstrating apical thinning with increased attenuation on resting images without significant ischemia. He did have mild LV dysfunction with an ejection fraction of 47% and focal apical inferior hypocontractility.    On 09/20/2013 a follow-up echo Doppler study showed an ejection fraction of 55% without diagnostic regional wall motion abnormalities although septal bounce was noted. He had grade 1 diastolic dysfunction. He had moderate calcification of his aortic valve without stenosis and there was moderate calcification of the mitral annulus with mildly calcified leaflets without significant regurgitation. His left atrium was mildly dilated. He had normal pulmonary pressures.  He is status post removal of a a basal cell cancer removed from his left cheek by Dr. Sarajane Jews.   He denies chest pain.  He  denies palpitations.  He denies shortness of breath.  He bowls and admits that he is not walking as much as he should due to some degenerative lumbar stenosis issues.  He'll be having blood work done next month by Dr. Rosana Hoes. He is unaware of palpitations. He denies presyncope or syncope.   Since I last saw he complains of back and leg discomfort.  He has seen Dr. Nolon Rod off free.  He was told of having spinal stenosis and has been treated medically.  He is going to the [place] and may be participating in the study of veterans who had been in [place].    He sees Dr. Isaac Laud next month.  We will be rechecking laboratory.  He tells me his Tradjenta which he had been on was recently stopped.  He presents for cardiology evaluation.   Past medical history: CAD, status post CABG, hypertension, type 2 diabetes mellitus, mixed hyperlipidemia, obstructive sleep apnea, mild  Past surgical history notable for CABG surgery, cataract surgery 2005 prostate surgery 1989.  Current Outpatient Prescriptions  Medication Sig Dispense Refill  . Ascorbic Acid (VITAMIN C) 1000 MG tablet Take 1,000 mg by mouth daily.    [name] aspirin 81 MG tablet Take 81 mg by mouth daily.    [name] atorvastatin (LIPITOR) 20 MG tablet Take 20 mg by mouth daily.    [name] BIOTIN 5000 PO Take by mouth.    . cetirizine (ZYRTEC) 10 MG tablet Take 10 mg by mouth daily.    . Choline Fenofibrate (TRILIPIX) 135 MG capsule Take 135 mg by mouth daily.    [name]  clopidogrel (PLAVIX) 75 MG tablet Take 75 mg by mouth daily.    . Cyanocobalamin (VITAMIN B 12 PO) Take 2,500 mcg by mouth.    . diphenoxylate-atropine (LOMOTIL) 2.5-0.025 MG per tablet Take 1 tablet by mouth 4 (four) times daily as needed for diarrhea or loose stools. 30 tablet 0  . folic acid (FOLVITE) 382 MCG tablet Take 400 mcg by mouth daily.    . hydrochlorothiazide (HYDRODIURIL) 25 MG tablet Take 25 mg by mouth daily.    [name] linagliptin (TRADJENTA) 5 MG TABS tablet Take 5 mg by mouth  daily.    [name] losartan (COZAAR) 100 MG tablet TAKE ONE TABLET BY MOUTH ONCE DAILY 90 tablet 3  . metFORMIN (GLUCOPHAGE) 1000 MG tablet Take 1,000 mg by mouth 2 (two) times daily with a meal.    . montelukast (SINGULAIR) 10 MG tablet Take 1 tablet by mouth at bedtime.    . Omega-3 Fat Ac-Cholecalciferol (MINICAPS VITAMIN-D/OMEGA-3) [phone] MG-UNIT CAPS Take 2,000 Int'l Units by mouth.    [name] omeprazole (PRILOSEC) 20 MG capsule Take 20 mg by mouth 2 (two) times daily before a meal.    . terazosin (HYTRIN) 5 MG capsule Take 1 capsule by mouth at bedtime.    [name] testosterone (ANDROGEL) 50 MG/5GM GEL Place 5 g onto the skin daily.    . vitamin E 400 UNIT capsule Take 400 Units by mouth daily.    . metoprolol succinate (TOPROL XL) 50 MG 24 hr tablet Take 1 tablet (50 mg total) by mouth daily. Take with or immediately following a meal. 30 tablet 6   No current facility-administered medications for this visit.    He is widowed per history children 4 grandchildren. There is no tobacco use. He does drink occasional alcohol.  ROS General: Negative; No fevers, chills, or night sweats;  HEENT: Negative; No changes in vision or hearing, sinus congestion, difficulty swallowing Pulmonary: Negative; No cough, wheezing, shortness of breath, hemoptysis Cardiovascular: Negative; No chest pain, presyncope, syncope, palpitations GI: Negative; No nausea, vomiting, diarrhea, or abdominal pain GU: Negative; No dysuria, hematuria, or difficulty voiding Musculoskeletal: Positive for back pain with leg discomfort secondary to spinal stenosis. Hematologic/Oncology: Negative; no easy bruising, bleeding Endocrine: Negative; no heat/cold intolerance; no diabetes Neuro: Negative; no changes in balance, headaches Skin: history of removal of prior basal cell carcinoma with clear margins; No rashes or skin lesions Psychiatric: Negative; No behavioral problems, depression Sleep: mild sleep apnea, not on CPAP therapy; No  daytime  sleepiness, hypersomnolence, bruxism, restless legs, hypnogognic hallucinations, no cataplexy Other comprehensive 14 point system review is negative.   PE BP 148/70 mmHg  Pulse 47  Ht $R'5\' 6"'hJ$  (1.676 m)  Wt 206 lb 11.2 oz (93.759 kg)  BMI 33.38 kg/m2  Repeat blood pressure by me was 130/80 General: Alert, oriented, no distress.  HEENT: Normocephalic, atraumatic. Pupils round and reactive; sclera anicteric;no lid lag,  Nose without nasal septal hypertrophy Mouth/Parynx benign; Mallinpatti scale 3 Neck: No JVD, no carotid bruits; normal carotid upstroke Lungs: clear to ausculatation and percussion; no wheezing or rales Chest: No chest wall discomfort Heart: RRR, s1 s2 normal  there isolate PVC. 5-0/5 systolic murmur; no diastolic murmur. No rubs thrills or heaves. Abdomen: moderate central adiposity;   soft, nontender; no hepatosplenomehaly, BS+; abdominal aorta nontender and not dilated by palpation. Back: No CVA tenderness Pulses 2+ Extremities: no clubbing cyanosis or edema, Homan's sign negative  Neurologic: grossly nonfocal Psychologic: Normal cognitive function, normal affect and mood  ECG (independently read  by me): Probable 2nd degree Wenkebach block  ECG (independently read by me): normal sinus rhythm with first-degree AV block with a PR interval at 230 milliseconds.  Prior March 2015ECG: (independently read by me) sinus rhythm at 73 beats per minute. First degree block with PR interval 248 ms. Isolated unifocal PVCs.   LABS:  BMP Latest Ref Rng 10/28/2013 07/24/2013  Glucose 70 - 99 mg/dL 135(H) 175(H)  BUN 6 - 23 mg/dL 20 20  Creatinine 0.50 - 1.35 mg/dL 1.43(H) 1.37(H)  Sodium 137 - 147 mEq/L 135(L) 138  Potassium 3.7 - 5.3 mEq/L 3.9 4.2  Chloride 96 - 112 mEq/L 101 103  CO2 19 - 32 mEq/L 20 27  Calcium 8.4 - 10.5 mg/dL 8.8 9.9   Hepatic Function Latest Ref Rng 10/28/2013  Total Protein 6.0 - 8.3 g/dL 7.5  Albumin 3.5 - 5.2 g/dL 3.9  AST 0 - 37 U/L 35  ALT 0 - 53  U/L 26  Alk Phosphatase 39 - 117 U/L 30(L)  Total Bilirubin 0.3 - 1.2 mg/dL 0.5   CBC Latest Ref Rng 10/28/2013  WBC 4.0 - 10.5 K/uL 5.8  Hemoglobin 13.0 - 17.0 g/dL 15.2  Hematocrit 39.0 - 52.0 % 44.6  Platelets 150 - 400 K/uL 230   No results found for: TSH  No results found for: HGBA1C  Lipid Panel  No results found for: CHOL, TRIG, HDL, CHOLHDL, VLDL, LDLCALC, LDLDIRECT   RADIOLOGY: No results found.    ASSESSMENT AND PLAN:  [name] is an 81 year old gentleman who is 15 years status post CABG revascularization surgery.  At that time, a preoperative nuclear perfusion study was done prior to him undergoing potential bone marrow transplantation for his brother was abnormal and led to his catheterization and ultimate detection of significant multivessel CAD. He was asymptomatic at that time without chest pain.  His ECG today reveals probable second degree AV block, type I.  He has been on Toprol-XL 100 mg daily.  I'm recommending he reduce this to 50 mg per day.  He will continue taking losartan 100 mg daily, HCTZ 25 mg in addition to his terra Zosyn for his hypertension.  His blood pressure today was 140/70.  He continues to be on dual antiplatelet therapy for his coronary obstructive disease.  His lipid lowering therapy consists of atorvastatin 20 mg in addition to fenofibrate acid 135 mg for mixed hyperlipidemia. His last  echo Doppler study confirms continued normal LV function which was different from the recent nuclear stress test which showed an ejection fraction of 47%. He did not have definitive wall motion abnormalities. Clinically he is without angina. He only notes mild shortness of breath with fast walking. He does have lumbar stenosis for which he has seen Dr. [name] which has limited his walking. He has mild obesity.  His weight has been relatively stable over the past 6 months.  Body mass index is 33.38 kg/m.  I have recommended.  Additional weight loss.  If at all possible.   He will be seeing Dr.Tisovic next month and complete set of laboratory will be obtained.  I will last these be sent to me for my review.  I will see him in 3 months for reevaluation.  Time spent: 25 minutes  Troy Sine, MD, [facility]  11/27/2014 6:10 PM

## 2015-02-04 ENCOUNTER — Ambulatory Visit (INDEPENDENT_AMBULATORY_CARE_PROVIDER_SITE_OTHER): Payer: Medicare Other | Admitting: Cardiovascular Disease

## 2015-02-04 VITALS — BP 116/70 | HR 75 | Ht 66.0 in | Wt 202.1 lb

## 2015-02-04 DIAGNOSIS — I440 Atrioventricular block, first degree: Secondary | ICD-10-CM

## 2015-02-04 DIAGNOSIS — I10 Essential (primary) hypertension: Secondary | ICD-10-CM | POA: Diagnosis not present

## 2015-02-04 DIAGNOSIS — E669 Obesity, unspecified: Secondary | ICD-10-CM

## 2015-02-04 DIAGNOSIS — I119 Hypertensive heart disease without heart failure: Secondary | ICD-10-CM

## 2015-02-04 DIAGNOSIS — E785 Hyperlipidemia, unspecified: Secondary | ICD-10-CM

## 2015-02-04 DIAGNOSIS — I2510 Atherosclerotic heart disease of native coronary artery without angina pectoris: Secondary | ICD-10-CM | POA: Diagnosis not present

## 2015-02-04 NOTE — Patient Instructions (Signed)
Your physician wants you to follow-up in: 6 months or sooner if needed. You will receive a reminder letter in the mail two months in advance. If you don't receive a letter, please call our office to schedule the follow-up appointment. 

## 2015-02-05 ENCOUNTER — Encounter: Payer: Self-pay | Admitting: Cardiovascular Disease

## 2015-02-05 DIAGNOSIS — I440 Atrioventricular block, first degree: Secondary | ICD-10-CM | POA: Insufficient documentation

## 2015-02-05 NOTE — Progress Notes (Signed)
Patient ID: Peter Mullen, male   DOB: 09/23/1931, 81 y.o.   MRN: [id number]     HPI: Peter Mullen is a 81 y.o. male who presents to the office today for a 2 month follow-up cardiology evaluation.   [name] has established CAD and in March 2001 underwent CABG revascularization surgery with a LIMA to the LAD, SVG to diagonal, SVG to the PLA, and SVG to the PDA vessel. Additional medical problems include hypertension, type 2 diabetes mellitus, mild obstructive sleep apnea not on CPAP therapy and obesity.   In October 2012 an nuclear perfusion study showed normal perfusion with a post stress ejection fraction of 62%. He has documented grade 1 diastolic dysfunction EA ratio 0.71.   A 2-D echo irevealed mild aortic sclerosis without stenosis, trace MR and trace TR. He has a history of mixed hyperlipidemia. He sees Dr. Vianne Mullen for his primary medical care. Laboratory showed a normal chemistry profile, cholesterol 93, triglycerides, 110 HDL 31, and LDL 40 on his current medical regimen.  [name]  underwent a two-year followup nuclear perfusion study on 06/30/2013. This was low risk nuclear scan demonstrating apical thinning with increased attenuation on resting images without significant ischemia. He did have mild LV dysfunction with an ejection fraction of 47% and focal apical inferior hypocontractility.    On 09/20/2013 a follow-up echo Doppler study showed an ejection fraction of 55% without diagnostic regional wall motion abnormalities although septal bounce was noted. He had grade 1 diastolic dysfunction. He had moderate calcification of his aortic valve without stenosis and there was moderate calcification of the mitral annulus with mildly calcified leaflets without significant regurgitation. His left atrium was mildly dilated. He had normal pulmonary pressures.  I last saw him 2 months ago, his ECG demonstrated probable second-degree AV block type I.  At that time, he had been on Toprol  100 mg daily and I reduced this to 50 mg per day.  He has continued to take losartan 100 mg, HCTZ in addition to a transfer hypertension.  He presents now for follow-up evaluation.  He is status post removal of a a basal cell cancer removed from his left cheek by Dr. Sarajane Mullen.   He has had issues with back and leg discomfort and was told by Dr. Gladstone Mullen that he has spinal stenosis, which has limited his walking.  Past medical history: CAD, status post CABG, hypertension, type 2 diabetes mellitus, mixed hyperlipidemia, obstructive sleep apnea, mild  Past surgical history notable for CABG surgery, cataract surgery 2005 prostate surgery 1989.  Current Outpatient Prescriptions  Medication Sig Dispense Refill  . Ascorbic Acid (VITAMIN C) 1000 MG tablet Take 1,000 mg by mouth as needed.     [name] aspirin 81 MG tablet Take 81 mg by mouth daily.    [name] atorvastatin (LIPITOR) 20 MG tablet Take 20 mg by mouth daily.    [name] BIOTIN 5000 PO Take by mouth.    . cetirizine (ZYRTEC) 10 MG tablet Take 10 mg by mouth daily.    . Choline Fenofibrate (TRILIPIX) 135 MG capsule Take 135 mg by mouth daily.    . clopidogrel (PLAVIX) 75 MG tablet Take 75 mg by mouth daily.    . Cyanocobalamin (VITAMIN B 12 PO) Take 2,500 mcg by mouth.    . diphenoxylate-atropine (LOMOTIL) 2.5-0.025 MG per tablet Take 1 tablet by mouth 4 (four) times daily as needed for diarrhea or loose stools. 30 tablet 0  . folic acid (FOLVITE) 158 MCG tablet Take  400 mcg by mouth daily.    . hydrochlorothiazide (HYDRODIURIL) 25 MG tablet Take 25 mg by mouth daily.    [name] losartan (COZAAR) 100 MG tablet TAKE ONE TABLET BY MOUTH ONCE DAILY 90 tablet 3  . metFORMIN (GLUCOPHAGE) 1000 MG tablet Take 1,000 mg by mouth 2 (two) times daily with a meal.    . metoprolol succinate (TOPROL XL) 50 MG 24 hr tablet Take 1 tablet (50 mg total) by mouth daily. Take with or immediately following a meal. 30 tablet 6  . montelukast (SINGULAIR) 10 MG tablet Take 1 tablet by  mouth at bedtime.    . Omega-3 Fat Ac-Cholecalciferol (MINICAPS VITAMIN-D/OMEGA-3) [phone] MG-UNIT CAPS Take 2,000 Int'l Units by mouth.    [name] omeprazole (PRILOSEC) 20 MG capsule Take 20 mg by mouth 2 (two) times daily before a meal.    . terazosin (HYTRIN) 5 MG capsule Take 1 capsule by mouth at bedtime.    [name] testosterone (ANDROGEL) 50 MG/5GM GEL Place 5 g onto the skin daily.    . vitamin E 400 UNIT capsule Take 400 Units by mouth daily.     No current facility-administered medications for this visit.    He is widowed per history children 4 grandchildren. There is no tobacco use. He does drink occasional alcohol.  ROS General: Negative; No fevers, chills, or night sweats;  HEENT: Negative; No changes in vision or hearing, sinus congestion, difficulty swallowing Pulmonary: Negative; No cough, wheezing, shortness of breath, hemoptysis Cardiovascular: Negative; No chest pain, presyncope, syncope, palpitations GI: Negative; No nausea, vomiting, diarrhea, or abdominal pain GU: Negative; No dysuria, hematuria, or difficulty voiding Musculoskeletal: Positive for back pain with leg discomfort secondary to spinal stenosis. Hematologic/Oncology: Negative; no easy bruising, bleeding Endocrine: Negative; no heat/cold intolerance; no diabetes Neuro: Negative; no changes in balance, headaches Skin: history of removal of prior basal cell carcinoma with clear margins; No rashes or skin lesions Psychiatric: Negative; No behavioral problems, depression Sleep: mild sleep apnea, not on CPAP therapy; No  daytime sleepiness, hypersomnolence, bruxism, restless legs, hypnogognic hallucinations, no cataplexy Other comprehensive 14 point system review is negative.   PE BP 116/70 mmHg  Pulse 75  Ht '5\' 6"'  (1.676 m)  Wt 202 lb 1.6 oz (91.672 kg)  BMI 32.64 kg/m2  Wt Readings from Last 3 Encounters:  02/04/15 202 lb 1.6 oz (91.672 kg)  11/26/14 206 lb 11.2 oz (93.759 kg)  05/29/14 205 lb 6.4 oz (93.169 kg)    General: Alert, oriented, no distress.  HEENT: Normocephalic, atraumatic. Pupils round and reactive; sclera anicteric;no lid lag,  Nose without nasal septal hypertrophy Mouth/Parynx benign; Mallinpatti scale 3 Neck: No JVD, no carotid bruits; normal carotid upstroke Lungs: clear to ausculatation and percussion; no wheezing or rales Chest: No chest wall discomfort Heart: RRR, s1 s2 normal  there isolate PVC. 5-8/0 systolic murmur; no diastolic murmur. No rubs thrills or heaves. Abdomen: moderate central adiposity;   soft, nontender; no hepatosplenomehaly, BS+; abdominal aorta nontender and not dilated by palpation. Back: No CVA tenderness Pulses 2+ Extremities: no clubbing cyanosis or edema, Homan's sign negative  Neurologic: grossly nonfocal Psychologic: Normal cognitive function, normal affect and mood  ECG (independently read by me): Sinus rhythm with first-degree AV block with a PR interval at 248 ms.  May 2016 ECG (independently read by me): Probable 2nd degree Wenkebach block  ECG (independently read by me): normal sinus rhythm with first-degree AV block with a PR interval at 230 milliseconds.  Prior March 2015ECG: (independently read  by me) sinus rhythm at 73 beats per minute. First degree block with PR interval 248 ms. Isolated unifocal PVCs.   LABS:  BMP Latest Ref Rng 10/28/2013 07/24/2013  Glucose 70 - 99 mg/dL 135(H) 175(H)  BUN 6 - 23 mg/dL 20 20  Creatinine 0.50 - 1.35 mg/dL 1.43(H) 1.37(H)  Sodium 137 - 147 mEq/L 135(L) 138  Potassium 3.7 - 5.3 mEq/L 3.9 4.2  Chloride 96 - 112 mEq/L 101 103  CO2 19 - 32 mEq/L 20 27  Calcium 8.4 - 10.5 mg/dL 8.8 9.9   Hepatic Function Latest Ref Rng 10/28/2013  Total Protein 6.0 - 8.3 g/dL 7.5  Albumin 3.5 - 5.2 g/dL 3.9  AST 0 - 37 U/L 35  ALT 0 - 53 U/L 26  Alk Phosphatase 39 - 117 U/L 30(L)  Total Bilirubin 0.3 - 1.2 mg/dL 0.5   CBC Latest Ref Rng 10/28/2013  WBC 4.0 - 10.5 K/uL 5.8  Hemoglobin 13.0 - 17.0 g/dL 15.2    Hematocrit 39.0 - 52.0 % 44.6  Platelets 150 - 400 K/uL 230   No results found for: TSH  No results found for: HGBA1C  Lipid Panel  No results found for: CHOL, TRIG, HDL, CHOLHDL, VLDL, LDLCALC, LDLDIRECT   RADIOLOGY: No results found.    ASSESSMENT AND PLAN:  [name] is an 81 year old gentleman who is 15 years status post CABG revascularization surgery. At that time, a preoperative nuclear perfusion study was done prior to him undergoing potential bone marrow transplantation for his brother was abnormal and led to his catheterization and ultimate detection of significant multivessel CAD. He was asymptomatic at that time without chest pain.  He has a history of hypertension and when I last saw him, he had second-degree AV block, type I.  He now is on a reduced dose of Toprol-XL at 50 mg in addition to his losartan 100 mg daily, HCTZ 25 mg, and Hytrin 5 mg.  His blood pressure today is well controlled.  He's had resolution of his second-degree heart block and now has first-degree heart block on his reduced dose of Toprol.  He continues to be on Lipitor 20 mg for hyperlipidemia.  He is on dual antiplatelets therapy with aspirin and Plavix.  He denies bleeding.  He is not having any anginal symptoms.  He will be seeing Dr.Tisoivic who will be checking his laboratory.  He does not have any significant edema.  He has a history of GERD, which is controlled with Prilosec.  He is mildly obese with a body mass index of 32.64.  Weight reduction was recommended.  I will see him in 6 months for reevaluation or sooner if needed.  Time spent: 25 minutes  Troy Sine, MD, [facility]  02/05/2015 9:41 PM

## 2015-02-22 ENCOUNTER — Other Ambulatory Visit: Payer: Self-pay | Admitting: Cardiovascular Disease

## 2015-02-22 NOTE — Telephone Encounter (Signed)
Rx(s) sent to pharmacy electronically.  

## 2015-06-27 ENCOUNTER — Other Ambulatory Visit: Payer: Self-pay | Admitting: Internal Medicine

## 2015-06-27 DIAGNOSIS — R197 Diarrhea, unspecified: Secondary | ICD-10-CM

## 2015-06-27 DIAGNOSIS — R1011 Right upper quadrant pain: Secondary | ICD-10-CM

## 2015-07-03 ENCOUNTER — Other Ambulatory Visit: Payer: Self-pay | Admitting: Cardiovascular Disease

## 2015-07-04 NOTE — Telephone Encounter (Signed)
REFILL 

## 2015-07-05 ENCOUNTER — Other Ambulatory Visit: Payer: Medicare Other

## 2015-07-08 ENCOUNTER — Ambulatory Visit
Admission: RE | Admit: 2015-07-08 | Discharge: 2015-07-08 | Disposition: A | Discharge status: Home / Self Care | Payer: Medicare Other | Source: Ambulatory Visit | Attending: Internal Medicine | Admitting: Internal Medicine

## 2015-07-08 DIAGNOSIS — R1011 Right upper quadrant pain: Secondary | ICD-10-CM

## 2015-07-08 DIAGNOSIS — R197 Diarrhea, unspecified: Secondary | ICD-10-CM

## 2015-07-08 IMAGING — US US ABDOMEN LIMITED
1 series · 14 of 25 positions shown · non-contrast
Comparison: CT [DATE]

CLINICAL DATA: Right upper quadrant pain for 2 weeks.

EXAM:
US ABDOMEN LIMITED - RIGHT UPPER QUADRANT

[Series 1: us abdomen limited · 0.30mm/px · 14 of 50 slices shown]
[im 1/50]
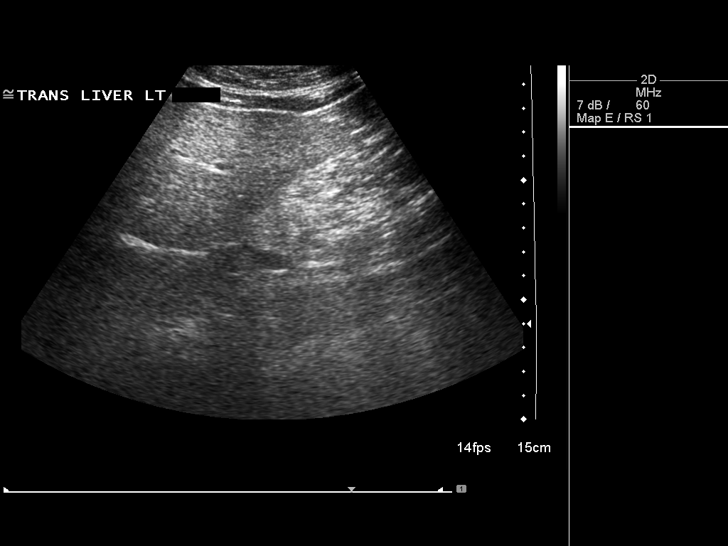
[im 5/50]
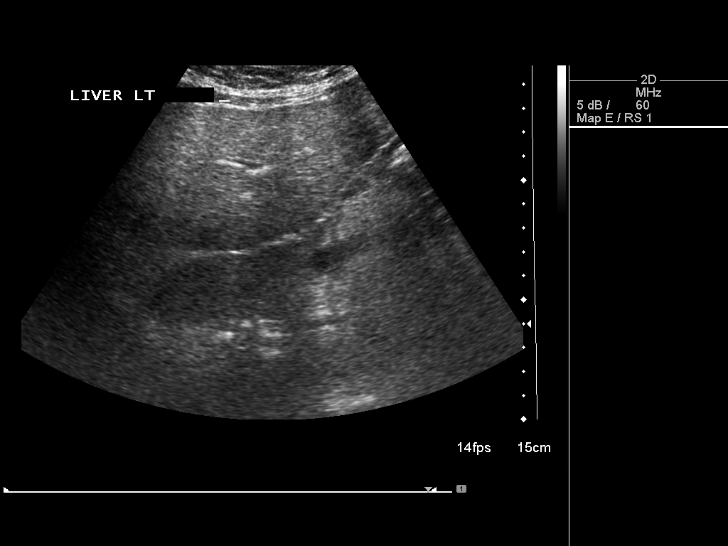
[im 9/50]
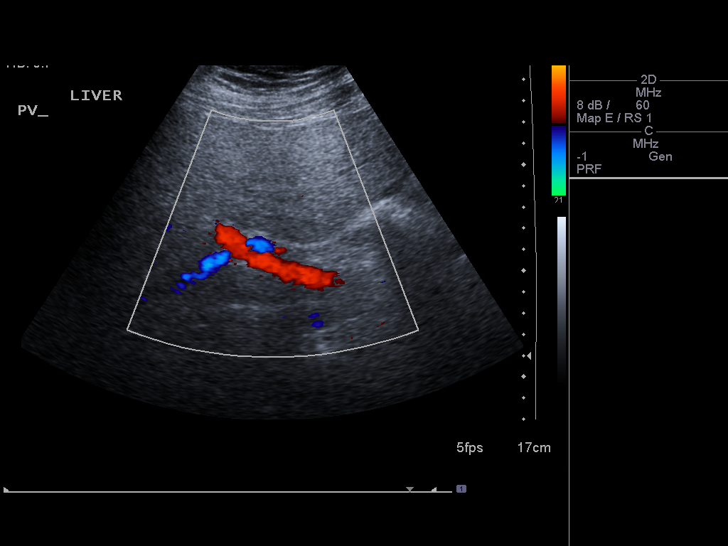
[im 13/50]
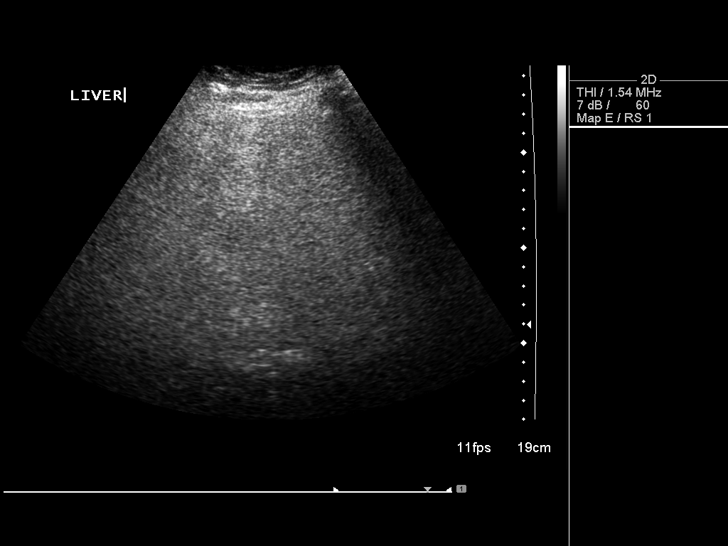
[im 17/50]
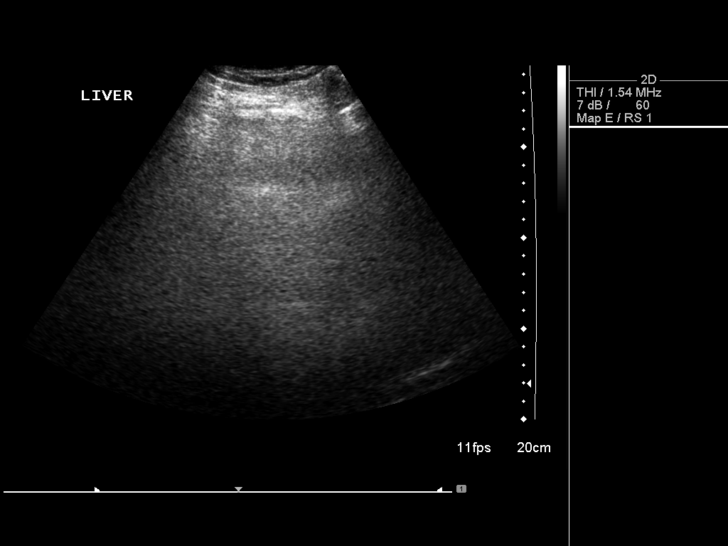
[im 19/50]
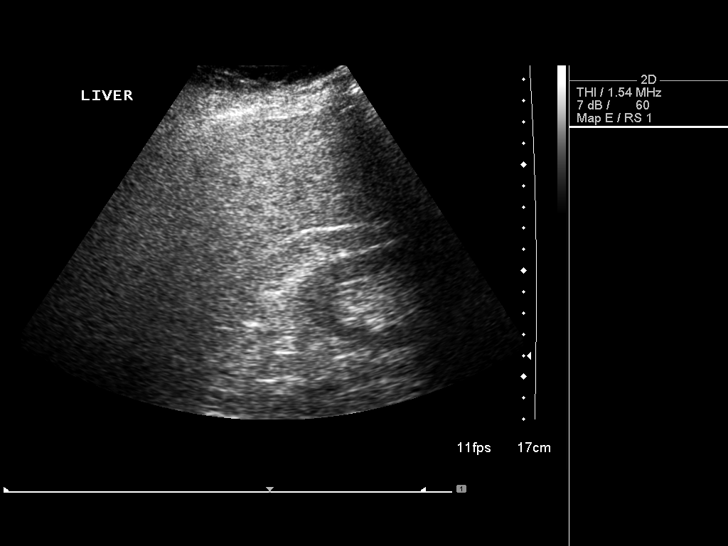
[im 23/50]
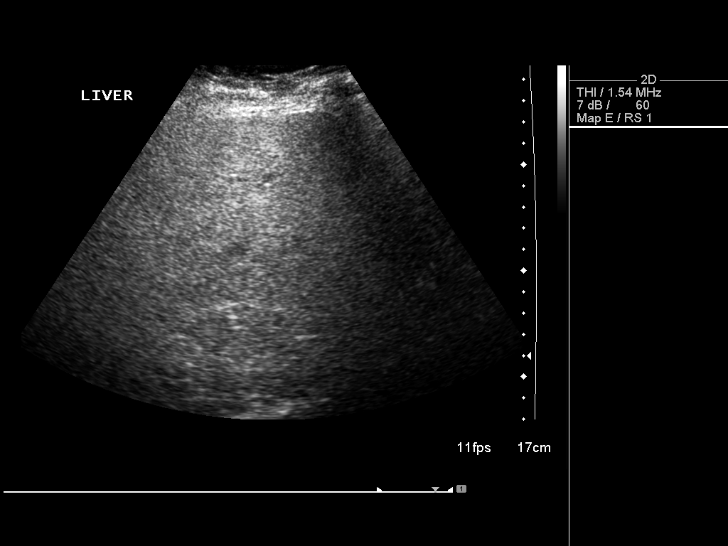
[im 27/50]
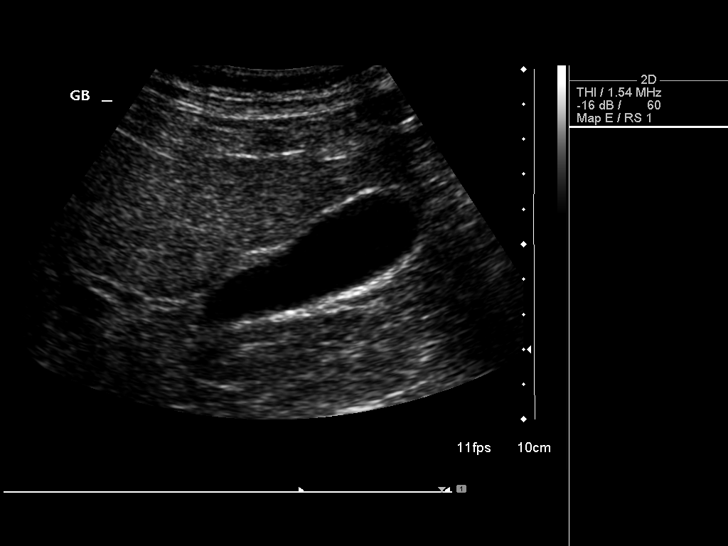
[im 31/50]
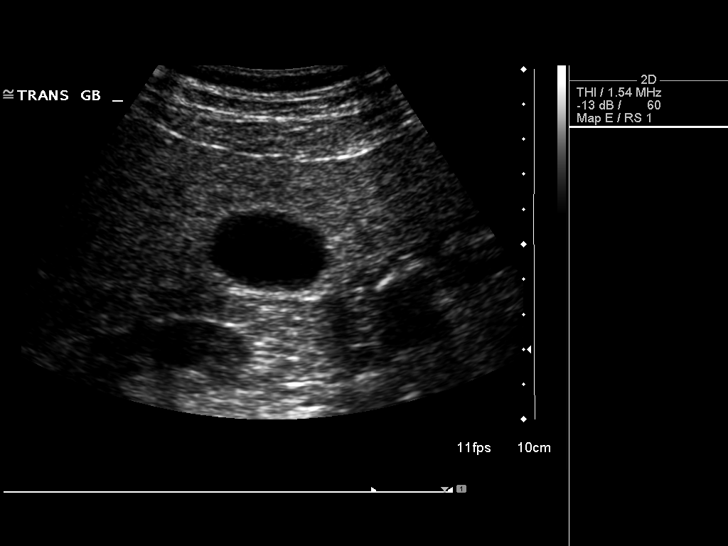
[im 33/50]
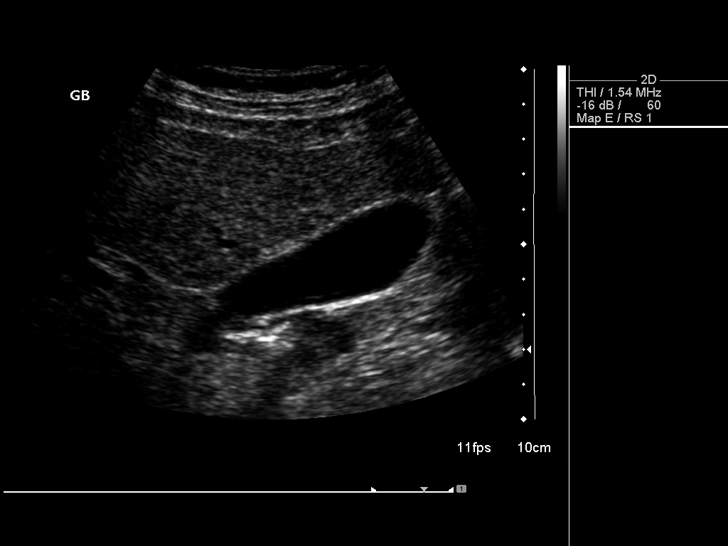
[im 37/50]
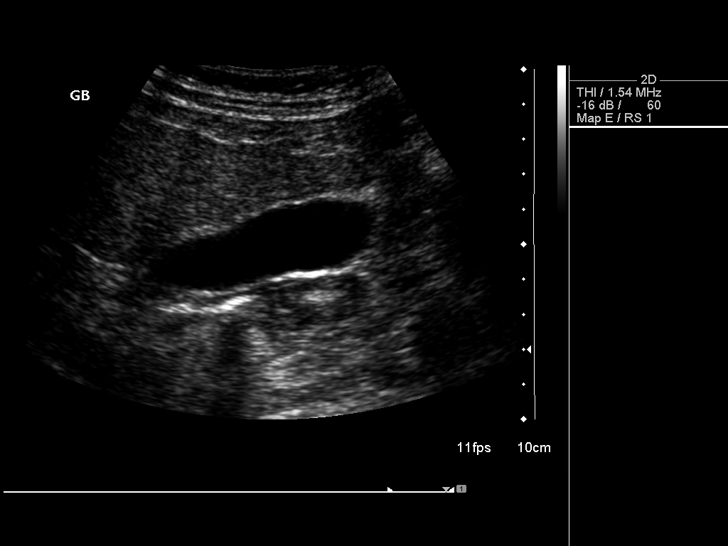
[im 41/50]
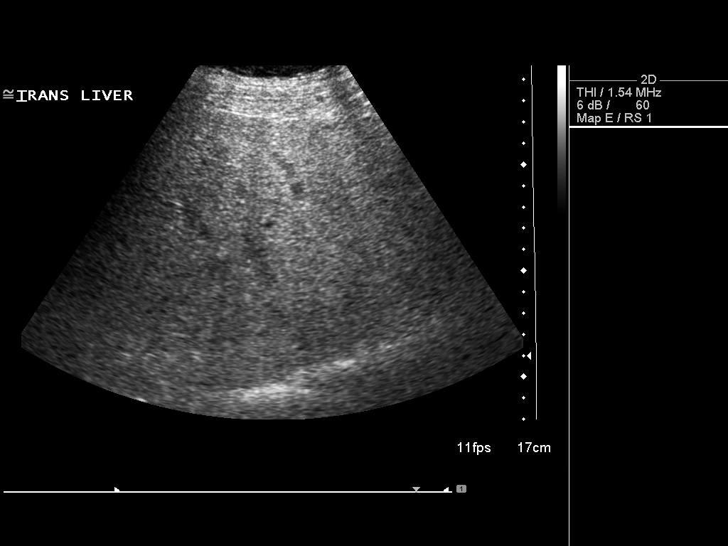
[im 45/50]
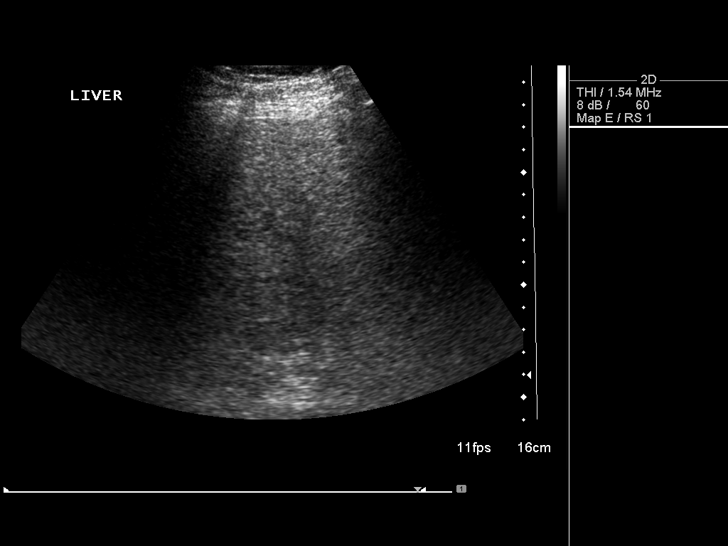
[im 50/50]
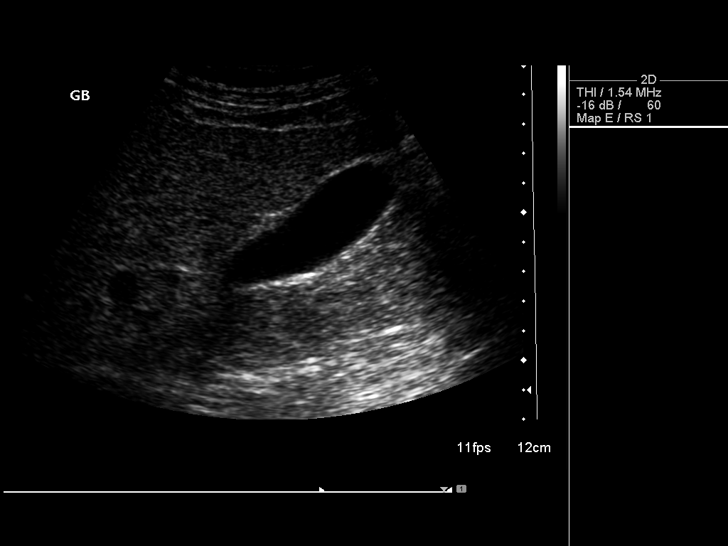

[14 of 25 positions shown; findings below may reference images not displayed]

FINDINGS: Gallbladder:

No gallstones or wall thickening visualized. No sonographic Murphy
sign noted.

Common bile duct:

Diameter: Normal caliber, 4 mm

Liver:

Increased echotexture throughout the liver compatible with fatty
infiltration. No visible focal abnormality or biliary ductal
dilatation.
IMPRESSION: Fatty infiltration of the liver.

## 2015-08-19 ENCOUNTER — Encounter: Payer: Self-pay | Admitting: Cardiovascular Disease

## 2015-08-19 ENCOUNTER — Ambulatory Visit (INDEPENDENT_AMBULATORY_CARE_PROVIDER_SITE_OTHER): Payer: Medicare Other | Admitting: Cardiovascular Disease

## 2015-08-19 VITALS — BP 138/70 | HR 62 | Ht 66.0 in | Wt 206.0 lb

## 2015-08-19 DIAGNOSIS — I441 Atrioventricular block, second degree: Secondary | ICD-10-CM

## 2015-08-19 DIAGNOSIS — I119 Hypertensive heart disease without heart failure: Secondary | ICD-10-CM

## 2015-08-19 DIAGNOSIS — I2510 Atherosclerotic heart disease of native coronary artery without angina pectoris: Secondary | ICD-10-CM

## 2015-08-19 DIAGNOSIS — I10 Essential (primary) hypertension: Secondary | ICD-10-CM

## 2015-08-19 DIAGNOSIS — G4730 Sleep apnea, unspecified: Secondary | ICD-10-CM

## 2015-08-19 DIAGNOSIS — I440 Atrioventricular block, first degree: Secondary | ICD-10-CM

## 2015-08-19 DIAGNOSIS — E669 Obesity, unspecified: Secondary | ICD-10-CM

## 2015-08-19 MED ORDER — METOPROLOL SUCCINATE ER 25 MG PO TB24: 25.0000 mg | ORAL_TABLET | Freq: Every day | ORAL | Status: DC

## 2015-08-19 NOTE — Patient Instructions (Signed)
Your physician has recommended you make the following change in your medication: the metoprolol has been decreased to 25 mg daily. A new prescription has been sent to your pharmacy.  Your physician wants you to follow-up in: 6 months or sooner if needed. You will receive a reminder letter in the mail two months in advance. If you don't receive a letter, please call our office to schedule the follow-up appointment.   If you need a refill on your cardiac medications before your next appointment, please call your pharmacy.

## 2015-08-21 ENCOUNTER — Encounter: Payer: Self-pay | Admitting: Cardiovascular Disease

## 2015-08-21 NOTE — Progress Notes (Signed)
Patient ID: Peter Mullen, male   DOB: 1931-12-03, 82 y.o.   MRN: [id number]    Primary M.D.: Dr. Delfino Lovett Tisoivic  HPI: Peter Mullen is a 82 y.o. male who presents to the office today for a 6 month follow-up cardiology evaluation.   [name] has established CAD and in March 2001 underwent CABG revascularization surgery with a LIMA to the LAD, SVG to diagonal, SVG to the PLA, and SVG to the PDA vessel. Additional medical problems include hypertension, type 2 diabetes mellitus, mild obstructive sleep apnea not on CPAP therapy and obesity.   In October 2012 an nuclear perfusion study showed normal perfusion with a post stress ejection fraction of 62%. He has documented grade 1 diastolic dysfunction EA ratio 0.71.   A 2-D echo irevealed mild aortic sclerosis without stenosis, trace MR and trace TR. He has a history of mixed hyperlipidemia. He sees Dr. [name] for his primary medical care. Laboratory showed a normal chemistry profile, cholesterol 93, triglycerides, 110 HDL 31, and LDL 40 on his current medical regimen.  A two-year followup nuclear perfusion study on 06/30/2013 was low risk and demonstrated apical thinning with increased attenuation on resting images without significant ischemia. He did have mild LV dysfunction with an ejection fraction of 47% and focal apical inferior hypocontractility.    On 09/20/2013 a follow-up echo Doppler study showed an ejection fraction of 55% without diagnostic regional wall motion abnormalities although septal bounce was noted. He had grade 1 diastolic dysfunction. He had moderate calcification of his aortic valve without stenosis and there was moderate calcification of the mitral annulus with mildly calcified leaflets without significant regurgitation. His left atrium was mildly dilated. He had normal pulmonary pressures.  Last year  his ECG demonstrated probable second-degree AV block type I.  At that time, he had been on Toprol 100 mg daily and I  reduced this to 50 mg per day.  He has continued to take losartan 100 mg, HCTZ in addition to terazocin for hypertension.   He is status post removal of a a basal cell cancer removed from his left cheek by Dr. Sarajane Jews.   He has had issues with back and leg discomfort and was told by Dr. [name] that he has spinal stenosis, which has limited his walking.   Past medical history: CAD, status post CABG, hypertension, type 2 diabetes mellitus, mixed hyperlipidemia, obstructive sleep apnea, mild  Past surgical history notable for CABG surgery, cataract surgery 2005 prostate surgery 1989.   he will be undergoing a colonoscopyin February and was advised to hold his Plavix.  He presents for evaluation.  Current Outpatient Prescriptions  Medication Sig Dispense Refill  . Ascorbic Acid (VITAMIN C) 1000 MG tablet Take 1,000 mg by mouth as needed.     [name] aspirin 81 MG tablet Take 81 mg by mouth daily.    [name] BIOTIN 5000 PO Take by mouth.    . cetirizine (ZYRTEC) 10 MG tablet Take 10 mg by mouth daily.    . Choline Fenofibrate (TRILIPIX) 135 MG capsule Take 135 mg by mouth daily.    . clopidogrel (PLAVIX) 75 MG tablet Take 75 mg by mouth daily.    . Cyanocobalamin (VITAMIN B 12 PO) Take 2,500 mcg by mouth.    . diphenoxylate-atropine (LOMOTIL) 2.5-0.025 MG per tablet Take 1 tablet by mouth 4 (four) times daily as needed for diarrhea or loose stools. 30 tablet 0  . folic acid (FOLVITE) 465 MCG tablet Take 400 mcg by mouth  daily.    . hydrochlorothiazide (HYDRODIURIL) 25 MG tablet Take 25 mg by mouth daily.    [name] LIPITOR 40 MG tablet TAKE 1/2 TAB (20MG) BY MOUTH DAILY 90 tablet 1  . losartan (COZAAR) 100 MG tablet TAKE ONE TABLET BY MOUTH ONCE DAILY 90 tablet 1  . metFORMIN (GLUCOPHAGE) 1000 MG tablet Take 1,000 mg by mouth 2 (two) times daily with a meal.    . montelukast (SINGULAIR) 10 MG tablet Take 1 tablet by mouth at bedtime.    . Omega-3 Fat Ac-Cholecalciferol (MINICAPS VITAMIN-D/OMEGA-3) [phone]  MG-UNIT CAPS Take 2,000 Int'l Units by mouth.    [name] omeprazole (PRILOSEC) 20 MG capsule Take 20 mg by mouth 2 (two) times daily before a meal.    . terazosin (HYTRIN) 5 MG capsule Take 1 capsule by mouth at bedtime.    [name] testosterone (ANDROGEL) 50 MG/5GM GEL Place 5 g onto the skin daily.    . vitamin E 400 UNIT capsule Take 400 Units by mouth daily.    . metoprolol succinate (TOPROL XL) 25 MG 24 hr tablet Take 1 tablet (25 mg total) by mouth daily. 30 tablet 11   No current facility-administered medications for this visit.    He is widowed per history children 4 grandchildren. There is no tobacco use. He does drink occasional alcohol.  ROS General: Negative; No fevers, chills, or night sweats;  HEENT: Negative; No changes in vision or hearing, sinus congestion, difficulty swallowing Pulmonary: Negative; No cough, wheezing, shortness of breath, hemoptysis Cardiovascular: Negative; No chest pain, presyncope, syncope, palpitations GI: Negative; No nausea, vomiting, diarrhea, or abdominal pain GU: Negative; No dysuria, hematuria, or difficulty voiding Musculoskeletal: Positive for back pain with leg discomfort secondary to spinal stenosis. Hematologic/Oncology: Negative; no easy bruising, bleeding Endocrine: Negative; no heat/cold intolerance; no diabetes Neuro: Negative; no changes in balance, headaches Skin: history of removal of prior basal cell carcinoma with clear margins; No rashes or skin lesions Psychiatric: Negative; No behavioral problems, depression Sleep: mild sleep apnea, not on CPAP therapy; No  daytime sleepiness, hypersomnolence, bruxism, restless legs, hypnogognic hallucinations, no cataplexy Other comprehensive 14 point system review is negative.   PE BP 138/70 mmHg  Pulse 62  Ht '5\' 6"'  (1.676 m)  Wt 206 lb (93.441 kg)  BMI 33.27 kg/m2  Wt Readings from Last 3 Encounters:  08/19/15 206 lb (93.441 kg)  02/04/15 202 lb 1.6 oz (91.672 kg)  11/26/14 206 lb 11.2 oz  (93.759 kg)   General: Alert, oriented, no distress.  HEENT: Normocephalic, atraumatic. Pupils round and reactive; sclera anicteric;no lid lag,  Nose without nasal septal hypertrophy Mouth/Parynx benign; Mallinpatti scale 3 Neck: No JVD, no carotid bruits; normal carotid upstroke Lungs: clear to ausculatation and percussion; no wheezing or rales Chest: No chest wall discomfort Heart: regular rhythm with mild arrhythmia, s1 s2 normal  6-8/0 systolic murmur; no diastolic murmur. No rubs thrills or heaves. Abdomen: moderate central adiposity;   soft, nontender; no hepatosplenomehaly, BS+; abdominal aorta nontender and not dilated by palpation. Back: No CVA tenderness Pulses 2+ Extremities: no clubbing cyanosis or edema, Homan's sign negative  Neurologic: grossly nonfocal Psychologic: Normal cognitive function, normal affect and mood  ECG (independently read by me):  Sinus rhythm marked first-degree block.there also was a blocked APC.   He was a suggestion of possible 2nd degree Wenkebach block.  July 2016 ECG (independently read by me): Sinus rhythm with first-degree AV block with a PR interval at 248 ms.  May 2016 ECG (independently read  by me): Probable 2nd degree Wenkebach block  ECG (independently read by me): normal sinus rhythm with first-degree AV block with a PR interval at 230 milliseconds.  Prior March 2015ECG: (independently read by me) sinus rhythm at 73 beats per minute. First degree block with PR interval 248 ms. Isolated unifocal PVCs.   LABS:  BMP Latest Ref Rng 10/28/2013 07/24/2013  Glucose 70 - 99 mg/dL 135(H) 175(H)  BUN 6 - 23 mg/dL 20 20  Creatinine 0.50 - 1.35 mg/dL 1.43(H) 1.37(H)  Sodium 137 - 147 mEq/L 135(L) 138  Potassium 3.7 - 5.3 mEq/L 3.9 4.2  Chloride 96 - 112 mEq/L 101 103  CO2 19 - 32 mEq/L 20 27  Calcium 8.4 - 10.5 mg/dL 8.8 9.9   Hepatic Function Latest Ref Rng 10/28/2013  Total Protein 6.0 - 8.3 g/dL 7.5  Albumin 3.5 - 5.2 g/dL 3.9  AST 0 - 37  U/L 35  ALT 0 - 53 U/L 26  Alk Phosphatase 39 - 117 U/L 30(L)  Total Bilirubin 0.3 - 1.2 mg/dL 0.5   CBC Latest Ref Rng 10/28/2013  WBC 4.0 - 10.5 K/uL 5.8  Hemoglobin 13.0 - 17.0 g/dL 15.2  Hematocrit 39.0 - 52.0 % 44.6  Platelets 150 - 400 K/uL 230   No results found for: TSH  No results found for: HGBA1C  Lipid Panel  No results found for: CHOL, TRIG, HDL, CHOLHDL, VLDL, LDLCALC, LDLDIRECT   RADIOLOGY: No results found.    ASSESSMENT AND PLAN:  [name] is an 82 year old gentleman who is 16 years status post CABG revascularization surgery in 2001. At that time, a preoperative nuclear perfusion study was done prior to him undergoing potential bone marrow transplantation for his brother was abnormal and led to his catheterization and ultimate detection of significant multivessel CAD. He was asymptomatic at that time without chest pain.  He has a history of hypertension and  has been demonstrated to have transient had second-degree AV block, type I.  He now is on a reduced dose of Toprol-XL at 50 mg in addition to his losartan 100 mg daily, HCTZ 25 mg, and Hytrin 5 mg.  His blood pressure today is well controlled.with his significant first-degree heart block and possible transient Wenkebach, I am further reducing his Toprol to 25 mg daily.  He continues to have intermittent left leg discomfort due to spinal stenosis  He also is beig evaluated for possible lactose intolerance. He will be having a colonoscopy in February. I have recommended that he was Plavix for at least 5 days prior to this procedure.he also will additional blood work by Dr. Rosana Hoes.   As long as he remains stable I will see him in 6 months for reevaluation.  Time spent: 25 minutes  Troy Sine, MD, [facility]  08/21/2015 7:36 PM

## 2015-09-12 ENCOUNTER — Telehealth: Payer: Self-pay | Admitting: *Deleted

## 2015-09-12 NOTE — Telephone Encounter (Signed)
Requesting surgical clearance: [facility]  1. Type of surgery: Colonoscopy  2. Surgeon: Clarene Essex  3. Surgical date: September 25, 2015  4. Medications that need to be held: Clopidogrel                        Special instructions:

## 2015-09-13 NOTE — Telephone Encounter (Signed)
[place] for [facility]; hold plavix for 5 days

## 2015-09-13 NOTE — Telephone Encounter (Signed)
Faxed to Dr Watt Climes [facility]

## 2015-10-31 ENCOUNTER — Ambulatory Visit (INDEPENDENT_AMBULATORY_CARE_PROVIDER_SITE_OTHER): Payer: Medicare Other | Admitting: Cardiovascular Disease

## 2015-10-31 ENCOUNTER — Encounter: Payer: Self-pay | Admitting: Cardiovascular Disease

## 2015-10-31 VITALS — BP 160/90 | HR 64 | Ht 66.0 in | Wt 209.0 lb

## 2015-10-31 DIAGNOSIS — I2510 Atherosclerotic heart disease of native coronary artery without angina pectoris: Secondary | ICD-10-CM

## 2015-10-31 DIAGNOSIS — E119 Type 2 diabetes mellitus without complications: Secondary | ICD-10-CM | POA: Diagnosis not present

## 2015-10-31 DIAGNOSIS — Z79899 Other long term (current) drug therapy: Secondary | ICD-10-CM

## 2015-10-31 DIAGNOSIS — I25810 Atherosclerosis of coronary artery bypass graft(s) without angina pectoris: Secondary | ICD-10-CM

## 2015-10-31 DIAGNOSIS — E785 Hyperlipidemia, unspecified: Secondary | ICD-10-CM | POA: Diagnosis not present

## 2015-10-31 DIAGNOSIS — Z794 Long term (current) use of insulin: Secondary | ICD-10-CM

## 2015-10-31 DIAGNOSIS — I10 Essential (primary) hypertension: Secondary | ICD-10-CM | POA: Diagnosis not present

## 2015-10-31 DIAGNOSIS — I119 Hypertensive heart disease without heart failure: Secondary | ICD-10-CM

## 2015-10-31 MED ORDER — AMLODIPINE BESYLATE 5 MG PO TABS: 5.0000 mg | ORAL_TABLET | Freq: Every day | ORAL | Status: DC

## 2015-10-31 NOTE — Patient Instructions (Signed)
Your physician recommends that you return for lab work fasting.  Your physician has recommended you make the following change in your medication: start new prescription for amlodipine 5 mg. This has been sent to your pharmacy.  Your physician recommends that you schedule a follow-up appointment in: 3 months.

## 2015-11-02 ENCOUNTER — Encounter: Payer: Self-pay | Admitting: Cardiovascular Disease

## 2015-11-02 NOTE — Progress Notes (Signed)
Patient ID: Peter Mullen, male   DOB: 1931-09-28, 82 y.o.   MRN: [id number]    Primary M.D.: Dr. Delfino Lovett Tisoivic  HPI: Peter Mullen is a 82 y.o. male who presents to the office today for a 3 month follow-up cardiology evaluation.  Chief complaint is that of increasing blood pressure.  [name] has established CAD and in March 2001 underwent CABG revascularization surgery with a LIMA to the LAD, SVG to diagonal, SVG to the PLA, and SVG to the PDA vessel. Additional medical problems include hypertension, type 2 diabetes mellitus, mild obstructive sleep apnea not on CPAP therapy and obesity.   In October 2012 an nuclear perfusion study showed normal perfusion with a post stress ejection fraction of 62%. He has documented grade 1 diastolic dysfunction EA ratio 0.71.   A 2-D echo irevealed mild aortic sclerosis without stenosis, trace MR and trace TR. He has a history of mixed hyperlipidemia. He sees Dr. Vianne Mullen for his primary medical care. Laboratory showed a normal chemistry profile, cholesterol 93, triglycerides, 110 HDL 31, and LDL 40 on his current medical regimen.  A two-year followup nuclear perfusion study on 06/30/2013 was low risk and demonstrated apical thinning with increased attenuation on resting images without significant ischemia. He did have mild LV dysfunction with an ejection fraction of 47% and focal apical inferior hypocontractility.    On 09/20/2013 a follow-up echo Doppler study showed an ejection fraction of 55% without diagnostic regional wall motion abnormalities although septal bounce was noted. He had grade 1 diastolic dysfunction. He had moderate calcification of his aortic valve without stenosis and there was moderate calcification of the mitral annulus with mildly calcified leaflets without significant regurgitation. His left atrium was mildly dilated. He had normal pulmonary pressures.  Last year  his ECG demonstrated probable second-degree AV block type I.  At  that time, he had been on Toprol 100 mg daily and I reduced this to 50 mg per day.  He has continued to take losartan 100 mg, HCTZ in addition to terazocin for hypertension.   Since I last saw him, he has broken 2 of his molars and will need to undergo dental surgery.  He has been evaluated at the dental clinic at [facility].  He has been on chronic Plavix.  Recently, he has noticed that his blood pressure has been consistently elevated despite taking his vacations which include HCTZ 25 mg, losartan 100 mg, Toprol-XL 25 mg, and Hytrin 5 mg daily.  He denies any recent chest pain.  He has not had recent laboratory.  He presents for evaluation.  He is status post removal of a a basal cell cancer removed from his left cheek by Dr. Sarajane Mullen.   He has had issues with back and leg discomfort and was told by Dr. Gladstone Mullen that he has spinal stenosis, which has limited his walking.   Past medical history: CAD, status post CABG, hypertension, type 2 diabetes mellitus, mixed hyperlipidemia, obstructive sleep apnea, mild  Past surgical history notable for CABG surgery, cataract surgery 2005 prostate surgery 1989.    Current Outpatient Prescriptions  Medication Sig Dispense Refill  . Ascorbic Acid (VITAMIN C) 1000 MG tablet Take 1,000 mg by mouth as needed.     [name] aspirin 81 MG tablet Take 81 mg by mouth daily.    [name] BIOTIN 5000 PO Take by mouth.    . cetirizine (ZYRTEC) 10 MG tablet Take 10 mg by mouth daily.    . Choline Fenofibrate (TRILIPIX) 135  MG capsule Take 135 mg by mouth daily.    . clopidogrel (PLAVIX) 75 MG tablet Take 75 mg by mouth daily.    . Cyanocobalamin (VITAMIN B 12 PO) Take 2,500 mcg by mouth.    . diphenoxylate-atropine (LOMOTIL) 2.5-0.025 MG per tablet Take 1 tablet by mouth 4 (four) times daily as needed for diarrhea or loose stools. 30 tablet 0  . folic acid (FOLVITE) 166 MCG tablet Take 400 mcg by mouth daily.    . hydrochlorothiazide (HYDRODIURIL) 25 MG tablet Take 25 mg by mouth daily.     [name] LIPITOR 40 MG tablet TAKE 1/2 TAB (20MG) BY MOUTH DAILY 90 tablet 1  . losartan (COZAAR) 100 MG tablet TAKE ONE TABLET BY MOUTH ONCE DAILY 90 tablet 1  . metFORMIN (GLUCOPHAGE) 1000 MG tablet Take 1,000 mg by mouth 2 (two) times daily with a meal.    . metoprolol succinate (TOPROL XL) 25 MG 24 hr tablet Take 1 tablet (25 mg total) by mouth daily. 30 tablet 11  . montelukast (SINGULAIR) 10 MG tablet Take 1 tablet by mouth at bedtime.    . Omega-3 Fat Ac-Cholecalciferol (MINICAPS VITAMIN-D/OMEGA-3) [phone] MG-UNIT CAPS Take 2,000 Int'l Units by mouth.    [name] omeprazole (PRILOSEC) 20 MG capsule Take 20 mg by mouth 2 (two) times daily before a meal.    . terazosin (HYTRIN) 5 MG capsule Take 1 capsule by mouth at bedtime.    [name] testosterone (ANDROGEL) 50 MG/5GM GEL Place 5 g onto the skin daily.    . vitamin E 400 UNIT capsule Take 400 Units by mouth daily.    [name] amLODipine (NORVASC) 5 MG tablet Take 1 tablet (5 mg total) by mouth daily. 90 tablet 3   No current facility-administered medications for this visit.    He is widowed per history children 4 grandchildren. There is no tobacco use. He does drink occasional alcohol.  ROS General: Negative; No fevers, chills, or night sweats;  HEENT: Positive for cracks in his molars; No changes in vision or hearing, sinus congestion, difficulty swallowing Pulmonary: Negative; No cough, wheezing, shortness of breath, hemoptysis Cardiovascular: Negative; No chest pain, presyncope, syncope, palpitations GI: Negative; No nausea, vomiting, diarrhea, or abdominal pain GU: Negative; No dysuria, hematuria, or difficulty voiding Musculoskeletal: Positive for back pain with leg discomfort secondary to spinal stenosis. Hematologic/Oncology: Negative; no easy bruising, bleeding Endocrine: Negative; no heat/cold intolerance; no diabetes Neuro: Negative; no changes in balance, headaches Skin: history of removal of prior basal cell carcinoma with clear margins; No  rashes or skin lesions Psychiatric: Negative; No behavioral problems, depression Sleep: mild sleep apnea, not on CPAP therapy; No  daytime sleepiness, hypersomnolence, bruxism, restless legs, hypnogognic hallucinations, no cataplexy Other comprehensive 14 point system review is negative.   PE BP 160/90 mmHg  Pulse 64  Ht _0  (1.676 m)  Wt 209 lb (94.802 kg)  BMI 33.75 kg/m2   Repeat blood pressure by me 164/90.  Wt Readings from Last 3 Encounters:  10/31/15 209 lb (94.802 kg)  08/19/15 206 lb (93.441 kg)  02/04/15 202 lb 1.6 oz (91.672 kg)   General: Alert, oriented, no distress.  HEENT: Normocephalic, atraumatic. Pupils round and reactive; sclera anicteric;no lid lag,  Nose without nasal septal hypertrophy Mouth/Parynx benign; Mallinpatti scale 3 Neck: No JVD, no carotid bruits; normal carotid upstroke Lungs: clear to ausculatation and percussion; no wheezing or rales Chest: No chest wall discomfort Heart: regular rhythm with mild arrhythmia, s1 s2 normal  0-6/3 systolic murmur; no  diastolic murmur. No rubs thrills or heaves. Abdomen: moderate central adiposity;   soft, nontender; no hepatosplenomehaly, BS+; abdominal aorta nontender and not dilated by palpation. Back: No CVA tenderness Pulses 2+ Extremities: no clubbing cyanosis or edema, Homan's sign negative  Neurologic: grossly nonfocal Psychologic: Normal cognitive function, normal affect and mood  ECG (independently read by me): Normal sinus rhythm at 64 bpm with occasional PVC and sinus arrhythmia.  First-degree AV block with a PR interval at 278 ms.  Blocked APC.  January 2017 ECG (independently read by me):  Sinus rhythm marked first-degree block.there also was a blocked APC.   He was a suggestion of possible 2nd degree Wenkebach block.  July 2016 ECG (independently read by me): Sinus rhythm with first-degree AV block with a PR interval at 248 ms.  May 2016 ECG (independently read by me): Probable 2nd degree  Wenkebach block  ECG (independently read by me): normal sinus rhythm with first-degree AV block with a PR interval at 230 milliseconds.  Prior March 2015ECG: (independently read by me) sinus rhythm at 73 beats per minute. First degree block with PR interval 248 ms. Isolated unifocal PVCs.   LABS:  BMP Latest Ref Rng 10/28/2013 07/24/2013  Glucose 70 - 99 mg/dL 135(H) 175(H)  BUN 6 - 23 mg/dL 20 20  Creatinine 0.50 - 1.35 mg/dL 1.43(H) 1.37(H)  Sodium 137 - 147 mEq/L 135(L) 138  Potassium 3.7 - 5.3 mEq/L 3.9 4.2  Chloride 96 - 112 mEq/L 101 103  CO2 19 - 32 mEq/L 20 27  Calcium 8.4 - 10.5 mg/dL 8.8 9.9   Hepatic Function Latest Ref Rng 10/28/2013  Total Protein 6.0 - 8.3 g/dL 7.5  Albumin 3.5 - 5.2 g/dL 3.9  AST 0 - 37 U/L 35  ALT 0 - 53 U/L 26  Alk Phosphatase 39 - 117 U/L 30(L)  Total Bilirubin 0.3 - 1.2 mg/dL 0.5   CBC Latest Ref Rng 10/28/2013  WBC 4.0 - 10.5 K/uL 5.8  Hemoglobin 13.0 - 17.0 g/dL 15.2  Hematocrit 39.0 - 52.0 % 44.6  Platelets 150 - 400 K/uL 230   No results found for: TSH  No results found for: HGBA1C  Lipid Panel  No results found for: CHOL, TRIG, HDL, CHOLHDL, VLDL, LDLCALC, LDLDIRECT   RADIOLOGY: No results found.    ASSESSMENT AND PLAN:  [name] is an 82 year old gentleman who is status post CABG revascularization surgery in 2001. At that time, a preoperative nuclear perfusion study was done prior to him undergoing potential bone marrow transplantation for his brother was abnormal and led to his catheterization and ultimate detection of significant multivessel CAD. He was asymptomatic at that time without chest pain.  He has a history of hypertension and  has been demonstrated to have transient had second-degree AV block, type I.  He now is on a reduced dose of Toprol-XL at 50 mg in addition to his losartan 100 mg daily, HCTZ 25 mg, and Hytrin 5 mg. over the last several weeks he has noticed that his blood pressure has been consistently elevated.  He  denies any episodes of chest pain or shortness of breath.  I am electing to add amlodipine 5 mg to his medical regimen for blood pressure as noted above.  I will not further increase his beta blocker since his rhythm is stable, although he does have an occasional PVC.  Follow-up fasting laboratory will be obtained.  He will need to hold his Plavix for 5 days prior to his dental work  on his molars.  I reviewed an abdominal ultrasound that he had done in December 2016 in this revealed fatty infiltration of the liver.  He has been on fish oil 2 capsules daily.  I'll contact him regarding his lab work and adjustments to his medications will be made as needed.  I will see him in 3 months for reevaluation.  Time spent: 25 minutes  Troy Sine, MD, [facility]  11/02/2015 10:59 AM

## 2015-11-11 ENCOUNTER — Telehealth: Payer: Self-pay | Admitting: Cardiovascular Disease

## 2015-11-11 MED ORDER — OLMESARTAN MEDOXOMIL 40 MG PO TABS: 40.0000 mg | ORAL_TABLET | Freq: Every day | ORAL | Status: DC

## 2015-11-11 NOTE — Telephone Encounter (Signed)
Returned call w/ advice. Pt voiced understanding. Aware to hold but keep amlodipine and to switch losartan for olmesartan at same frequency - aware of mg difference and rationale for dose recommendation. Will have scheduler reach out to make appt w [name].

## 2015-11-11 NOTE — Telephone Encounter (Signed)
Pt says he is having some swelling in his ankles.This started on Friday and got worse Saturday and Sunday. He is taking Lasix 25 mg,please call to advise.

## 2015-11-11 NOTE — Telephone Encounter (Signed)
Stop amlodipine for now.  Have him switch losartan 100 mg to olmesartan 40 mg qd.  If it's not covered on his plan then let me know.  I think he'll get better BP control with it than losartan, especially if we won't be able to use the amlodipine.  Tell him to keep the amlodipine for now, we may try lower dose later.  Have him follow up with Pharmacy [facility] in 2-3 weeks

## 2015-11-11 NOTE — Telephone Encounter (Signed)
Pt of Dr. Claiborne Billings - Seen in [place] on 4/6  I returned patient call. He noticed swelling in ankles over the weekend. Notes this is better today but that he has had his feet up more and less active today.  We discussed recent med changes - he started amlodipine 5mg  daily for BP control at his appt w Dr. Claiborne Billings. I made pt aware of potential swelling as SE of this med. He voiced understanding and was reassured.  He denies SOB, fatigue. Notes BPs for past week ~135-140/80-85, HR 60s. Advised since amlodipine prescribed to address BP, I will get recommendations. Note he is taking HCTZ 25mg  daily, not furosemide. I clarified w/ patient.  Routed to [place] for advice.

## 2015-11-12 ENCOUNTER — Telehealth: Payer: Self-pay | Admitting: Cardiovascular Disease

## 2015-11-12 NOTE — Telephone Encounter (Signed)
Closed encounter °

## 2015-11-26 LAB — CBC
HCT: 44.6 % (ref 38.5–50.0)
Hemoglobin: 14.6 g/dL (ref 13.2–17.1)
MCH: 29.9 pg (ref 27.0–33.0)
MCHC: 32.7 g/dL (ref 32.0–36.0)
MCV: 91.4 fL (ref 80.0–100.0)
MPV: 10.6 fL (ref 7.5–12.5)
PLATELETS: 227 10*3/uL (ref 140–400)
RBC: 4.88 MIL/uL (ref 4.20–5.80)
RDW: 14 % (ref 11.0–15.0)
WBC: 7.5 10*3/uL (ref 3.8–10.8)

## 2015-11-26 LAB — COMPREHENSIVE METABOLIC PANEL
ALK PHOS: 59 U/L (ref 40–115)
ALT: 26 U/L (ref 9–46)
AST: 29 U/L (ref 10–35)
Albumin: 4.2 g/dL (ref 3.6–5.1)
BILIRUBIN TOTAL: 0.6 mg/dL (ref 0.2–1.2)
BUN: 20 mg/dL (ref 7–25)
CO2: 27 mmol/L (ref 20–31)
Calcium: 9.7 mg/dL (ref 8.6–10.3)
Chloride: 99 mmol/L (ref 98–110)
Creat: 1.11 mg/dL (ref 0.70–1.11)
GLUCOSE: 117 mg/dL — AB (ref 65–99)
Potassium: 4.5 mmol/L (ref 3.5–5.3)
Sodium: 139 mmol/L (ref 135–146)
Total Protein: 6.6 g/dL (ref 6.1–8.1)

## 2015-11-26 LAB — HEMOGLOBIN A1C
HEMOGLOBIN A1C: 6.8 % — AB (ref <5.7)
Mean Plasma Glucose: 148 mg/dL

## 2015-11-26 LAB — LIPID PANEL
Cholesterol: 101 mg/dL — ABNORMAL LOW (ref 125–200)
HDL: 42 mg/dL (ref >=40)
LDL CALC: 25 mg/dL (ref <130)
Total CHOL/HDL Ratio: 2.4 Ratio (ref <=5.0)
Triglycerides: 170 mg/dL — ABNORMAL HIGH (ref <150)
VLDL: 34 mg/dL — ABNORMAL HIGH (ref <30)

## 2015-11-26 LAB — TSH: TSH: 3.25 m[IU]/L (ref 0.40–4.50)

## 2015-11-28 ENCOUNTER — Ambulatory Visit (INDEPENDENT_AMBULATORY_CARE_PROVIDER_SITE_OTHER): Payer: Medicare Other | Admitting: Pharmacist

## 2015-11-28 VITALS — BP 132/88 | Wt 201.8 lb

## 2015-11-28 DIAGNOSIS — I10 Essential (primary) hypertension: Secondary | ICD-10-CM

## 2015-11-28 MED ORDER — METOPROLOL SUCCINATE ER 25 MG PO TB24
25.0000 mg | ORAL_TABLET | Freq: Every day | ORAL | Status: DC
Start: 2015-11-28 — End: 2015-12-04

## 2015-11-28 MED ORDER — OLMESARTAN MEDOXOMIL 40 MG PO TABS: 40.0000 mg | ORAL_TABLET | Freq: Every day | ORAL | Status: DC

## 2015-11-28 NOTE — Patient Instructions (Addendum)
Thank you for coming in today.  Continue taking your medications as prescribed. Continue to monitor your blood pressure at home.  Follow-up with Dr. Claiborne Billings

## 2015-11-28 NOTE — Progress Notes (Signed)
Patient ID: Peter Mullen                 DOB: 11-Mar-1932                      MRN: [id number]     HPI: Peter Mullen is a 82 y.o. male referred by Dr. Claiborne Billings to HTN clinic after a recent change in blood pressure medications. He discontinued amlodipine 5mg  daily after having lower leg edema. Erasmo Mullen had him change his losartan 100mg  to olmesartan 40mg  daily to see if this would add better control. He states he has been doing much better since the medication changes. His legs are no longer swollen and he is pleased with his blood pressure results.   He also asks about vitamin supplements including dose of Vitamins E, D, and C.  Current HTN meds:  Hydrochlorothiazide 25mg  daily Toprol XL 25mg  olmesartan 40mg  daily Previously tried:  Amlodipine 5mg  daily - swelling  BP goal: <150/90  Family History: History of coronary disease on mother's side.   Diet: He admits that he does add salt to his food occasionally and generally does not watch what he eats.   Exercise: He is very active with walking and work in the yard.   Home BP readings: 120-130s/80s pulse low 70s - per patient report (no records with him)  Wt Readings from Last 3 Encounters:  11/28/15 201 lb 12.8 oz (91.536 kg)  10/31/15 209 lb (94.802 kg)  08/19/15 206 lb (93.441 kg)   BP Readings from Last 3 Encounters:  11/28/15 132/88  10/31/15 160/90  08/19/15 138/70   Pulse Readings from Last 3 Encounters:  10/31/15 64  08/19/15 62  02/04/15 75    Renal function: Estimated Creatinine Clearance: 54.4 mL/min (by C-G formula based on Cr of 1.11).  Past Medical History  Diagnosis Date  . Diabetes mellitus without complication ([place])   . Skin cancer   . Hypertension   . Stomach problems   . Hyperlipidemia   . Coronary artery disease     Current Outpatient Prescriptions on File Prior to Visit  Medication Sig Dispense Refill  . Ascorbic Acid (VITAMIN C) 1000 MG tablet Take 1,000 mg by mouth as needed.     [name]  aspirin 81 MG tablet Take 81 mg by mouth daily.    [name] BIOTIN 5000 PO Take by mouth.    . cetirizine (ZYRTEC) 10 MG tablet Take 10 mg by mouth daily.    . Choline Fenofibrate (TRILIPIX) 135 MG capsule Take 135 mg by mouth daily.    . clopidogrel (PLAVIX) 75 MG tablet Take 75 mg by mouth daily.    . Cyanocobalamin (VITAMIN B 12 PO) Take 2,500 mcg by mouth.    . folic acid (FOLVITE) [id number] MCG tablet Take 400 mcg by mouth daily.    . hydrochlorothiazide (HYDRODIURIL) 25 MG tablet Take 25 mg by mouth daily.    [name] LIPITOR 40 MG tablet TAKE 1/2 TAB (20MG ) BY MOUTH DAILY 90 tablet 1  . metFORMIN (GLUCOPHAGE) 1000 MG tablet Take 1,000 mg by mouth 2 (two) times daily with a meal.    . metoprolol succinate (TOPROL XL) 25 MG 24 hr tablet Take 1 tablet (25 mg total) by mouth daily. 30 tablet 11  . montelukast (SINGULAIR) 10 MG tablet Take 1 tablet by mouth at bedtime.    [name] olmesartan (BENICAR) 40 MG tablet Take 1 tablet (40 mg total) by mouth daily. 30 tablet 1  .  Omega-3 Fat Ac-Cholecalciferol (MINICAPS VITAMIN-D/OMEGA-3) [id number] MG-UNIT CAPS Take 2,000 Int'l Units by mouth.    [name] omeprazole (PRILOSEC) 20 MG capsule Take 20 mg by mouth 2 (two) times daily before a meal.    . terazosin (HYTRIN) 5 MG capsule Take 1 capsule by mouth at bedtime.    [name] testosterone (ANDROGEL) 50 MG/5GM GEL Place 5 g onto the skin daily.    . vitamin E 400 UNIT capsule Take 400 Units by mouth daily.    . diphenoxylate-atropine (LOMOTIL) 2.5-0.025 MG per tablet Take 1 tablet by mouth 4 (four) times daily as needed for diarrhea or loose stools. (Patient not taking: Reported on 11/28/2015) 30 tablet 0   No current facility-administered medications on file prior to visit.    Allergies  Allergen Reactions  . Codeine Other (See Comments)    Unspecified   . Nsaids Other (See Comments)    Unspecified      Assessment/Plan: Hypertension:  BP is well controlled on his current regimen. No medication changes today. Encouraged him to  continue to monitor his blood pressure and bring those results to his visit with Dr. Claiborne Billings.  He is requesting printed 90 day prescriptions for olmesartan and metoprolol to take to the [place] since these medications are cheaper there. Told him we would have to wait for Dr. Claiborne Billings to sign them and he said he had plenty of medication for now, just to call him when they were ready to be picked up.   Thank you, Lelan Pons. Patterson Hammersmith, [facility]  11/28/2015 4:18 PM

## 2015-12-04 MED ORDER — OLMESARTAN MEDOXOMIL 40 MG PO TABS: 40.0000 mg | ORAL_TABLET | Freq: Every day | ORAL | Status: DC

## 2015-12-04 MED ORDER — METOPROLOL SUCCINATE ER 25 MG PO TB24: 25.0000 mg | ORAL_TABLET | Freq: Every day | ORAL | Status: DC

## 2015-12-09 ENCOUNTER — Telehealth: Payer: Self-pay | Admitting: Cardiovascular Disease

## 2015-12-09 NOTE — Telephone Encounter (Signed)
Pt wants you to know that the [facility] will calling to get prior authorization for his Olmesartan.

## 2015-12-09 NOTE — Telephone Encounter (Signed)
Message routed to Wanda, CMA 

## 2015-12-10 ENCOUNTER — Encounter: Payer: Self-pay | Admitting: Pharmacist

## 2015-12-25 ENCOUNTER — Telehealth: Payer: Self-pay | Admitting: Cardiovascular Disease

## 2015-12-25 NOTE — Telephone Encounter (Signed)
TK please advise

## 2015-12-25 NOTE — Telephone Encounter (Signed)
Dr. Flint Melter is calling in stating that the patient will be havig 2 different appts with her that will require bone grafting and tooth extractions. She wanted to know if Dr. Claiborne Billings would like to modify the patients dosages of his Clopidogrel. Please f/u with her . The appointment dates are 6/6 and 7/12.   Thanks

## 2015-12-26 NOTE — Telephone Encounter (Signed)
Received phone call from Dr Flint Melter concerning pt clearance. She said pt will have deep cleaning and one tooth extracted on June 8 and one tooth extracted on July 12.  She said if he wanted both teeth extracted at the same time so not to interrupt therapy two times but one time, then they could do it all at one time.  Let her know we were waiting for Dr Claiborne Billings to be in the office tomorrow, then we will get the information. It will need to be faxed to Attn: Dr Flint Melter at [phone].

## 2015-12-26 NOTE — Telephone Encounter (Signed)
She is still waiting to hear from somebody.

## 2015-12-26 NOTE — Telephone Encounter (Signed)
Spoke with Dr Evette Georges nurse, he has the pt information for clearance and will be back in the office tomorrow.  This will then be addressed tomorrow.  Returned call to dentist. It went to a voicemail, left message to call back.

## 2015-12-26 NOTE — Telephone Encounter (Signed)
Peter Mullen here is only note that I have in my box.

## 2015-12-27 NOTE — Telephone Encounter (Signed)
Follow up      Dr Maryjean Morn is calling to see if Dr Claiborne Billings ok'd clearance for this pt.  Please call and let her know something

## 2015-12-27 NOTE — Telephone Encounter (Signed)
RN SPOKE TO Dr Claiborne Billings , concerning patient holding aspirin and clopidogrel prior to January 02 2016- deep cleaning  and July 12-  tooth extraction   Per verbal order from Dr Claiborne Billings, patient may hold aspirin and clopidogrel 5 days prior to both procedures and restart next day if stable  Information was given to Dr Flint Melter, and message routed to the fax  1 [phone]

## 2016-01-08 ENCOUNTER — Encounter: Payer: Self-pay | Admitting: *Deleted

## 2016-02-07 ENCOUNTER — Encounter: Payer: Self-pay | Admitting: Cardiovascular Disease

## 2016-02-07 ENCOUNTER — Ambulatory Visit (INDEPENDENT_AMBULATORY_CARE_PROVIDER_SITE_OTHER): Payer: Medicare Other | Admitting: Cardiovascular Disease

## 2016-02-07 VITALS — BP 156/84 | HR 64 | Ht 66.0 in | Wt 198.8 lb

## 2016-02-07 DIAGNOSIS — E785 Hyperlipidemia, unspecified: Secondary | ICD-10-CM | POA: Diagnosis not present

## 2016-02-07 DIAGNOSIS — I2510 Atherosclerotic heart disease of native coronary artery without angina pectoris: Secondary | ICD-10-CM | POA: Diagnosis not present

## 2016-02-07 DIAGNOSIS — I10 Essential (primary) hypertension: Secondary | ICD-10-CM | POA: Diagnosis not present

## 2016-02-07 DIAGNOSIS — I440 Atrioventricular block, first degree: Secondary | ICD-10-CM

## 2016-02-07 DIAGNOSIS — I119 Hypertensive heart disease without heart failure: Secondary | ICD-10-CM | POA: Diagnosis not present

## 2016-02-07 DIAGNOSIS — E118 Type 2 diabetes mellitus with unspecified complications: Secondary | ICD-10-CM

## 2016-02-07 MED ORDER — SPIRONOLACTONE 25 MG PO TABS: 25.0000 mg | ORAL_TABLET | Freq: Every day | ORAL | Status: DC

## 2016-02-07 NOTE — Patient Instructions (Signed)
Your physician has recommended you make the following change in your medication:   1.) start new prescription for spironolactone.  Your physician recommends that you return for lab work in 1-2 weeks.   Your physician recommends that you schedule a follow-up appointment in: 3 months.

## 2016-02-09 ENCOUNTER — Encounter: Payer: Self-pay | Admitting: Cardiovascular Disease

## 2016-02-09 NOTE — Progress Notes (Signed)
Patient ID: Peter Mullen, male   DOB: 1931/12/11, 82 y.o.   MRN: [id number]    Primary M.D.: Dr. Delfino Lovett Tisoivic  HPI: Peter Mullen is a 82 y.o. male who presents to the office today for a 3 month follow-up cardiology evaluation.  Chief complaint is that of increasing blood pressure.  Peter Mullen has established CAD and in March 2001 underwent CABG revascularization surgery with a LIMA to the LAD, SVG to diagonal, SVG to the PLA, and SVG to the PDA vessel. Additional medical problems include hypertension, type 2 diabetes mellitus, mild obstructive sleep apnea not on CPAP therapy and obesity.   In October 2012 an nuclear perfusion study showed normal perfusion with a post stress ejection fraction of 62%. He has documented grade 1 diastolic dysfunction EA ratio 0.71.   A 2-D echo irevealed mild aortic sclerosis without stenosis, trace MR and trace TR. He has a history of mixed hyperlipidemia. He sees Dr. [name] for his primary medical care. Laboratory showed a normal chemistry profile, cholesterol 93, triglycerides, 110 HDL 31, and LDL 40 on his current medical regimen.  A two-year followup nuclear perfusion study on 06/30/2013 was low risk and demonstrated apical thinning with increased attenuation on resting images without significant ischemia. He did have mild LV dysfunction with an ejection fraction of 47% and focal apical inferior hypocontractility.    On 09/20/2013 a follow-up echo Doppler study showed an ejection fraction of 55% without diagnostic regional wall motion abnormalities although septal bounce was noted. He had grade 1 diastolic dysfunction. He had moderate calcification of his aortic valve without stenosis and there was moderate calcification of the mitral annulus with mildly calcified leaflets without significant regurgitation. His left atrium was mildly dilated. He had normal pulmonary pressures.  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  At that  time, he had been on Toprol 100 mg daily and I reduced this to 50 mg per day.  He has continued to take losartan 100 mg, HCTZ in addition to terazocin for hypertension.   Earlier this year he had broken 2 of his molars and will need to undergo dental surgery.  He has been evaluated at the dental clinic at [facility].  He has been on chronic Plavix.  Recently, he had noticed that his blood pressure has been consistently elevated despite taking his vacations which include HCTZ 25 mg, losartan 100 mg, Toprol-XL 25 mg, and Hytrin 5 mg daily.  He denies any recent chest pain.  When I last saw him, I added amlodipine 5 mg to his medical regimen for improved blood pressure control.  He had held his Plavix for dental work on his molars.  He denies recent chest pain or palpitations.  He presents for reevaluation.  He is status post removal of a a basal cell cancer removed from his left cheek by Dr. Sarajane Jews.   He has had issues with back and leg discomfort and was told by Dr. [name] that he has spinal stenosis, which has limited his walking.   Past medical history: CAD, status post CABG, hypertension, type 2 diabetes mellitus, mixed hyperlipidemia, obstructive sleep apnea, mild  Past surgical history notable for CABG surgery, cataract surgery 2005 prostate surgery 1989.    Current Outpatient Prescriptions  Medication Sig Dispense Refill  . Ascorbic Acid (VITAMIN C) 1000 MG tablet Take 1,000 mg by mouth as needed.     [name] aspirin 81 MG tablet Take 81 mg by mouth daily.    [name]  BIOTIN 5000 PO Take by mouth.    . cetirizine (ZYRTEC) 10 MG tablet Take 10 mg by mouth daily.    . Choline Fenofibrate (TRILIPIX) 135 MG capsule Take 135 mg by mouth daily.    . clopidogrel (PLAVIX) 75 MG tablet Take 75 mg by mouth daily.    . Cyanocobalamin (VITAMIN B 12 PO) Take 2,500 mcg by mouth.    . diphenhydrAMINE (BENADRYL) 25 mg capsule Take 25 mg by mouth as needed.    . diphenhydramine-acetaminophen (TYLENOL PM) 25-500 MG TABS  tablet Take 1 tablet by mouth at bedtime as needed.    . diphenoxylate-atropine (LOMOTIL) 2.5-0.025 MG per tablet Take 1 tablet by mouth 4 (four) times daily as needed for diarrhea or loose stools. 30 tablet 0  . folic acid (FOLVITE) 412 MCG tablet Take 400 mcg by mouth daily.    . hydrochlorothiazide (HYDRODIURIL) 25 MG tablet Take 25 mg by mouth daily.    [name] LIPITOR 40 MG tablet TAKE 1/2 TAB (20MG) BY MOUTH DAILY 90 tablet 1  . metFORMIN (GLUCOPHAGE) 1000 MG tablet Take 1,000 mg by mouth 2 (two) times daily with a meal.    . metoprolol succinate (TOPROL XL) 25 MG 24 hr tablet Take 1 tablet (25 mg total) by mouth daily. 90 tablet 3  . montelukast (SINGULAIR) 10 MG tablet Take 1 tablet by mouth at bedtime.    [name] olmesartan (BENICAR) 40 MG tablet Take 1 tablet (40 mg total) by mouth daily. 90 tablet 3  . Omega-3 Fat Ac-Cholecalciferol (MINICAPS VITAMIN-D/OMEGA-3) [phone] MG-UNIT CAPS Take 2,000 Int'l Units by mouth.    [name] omeprazole (PRILOSEC) 20 MG capsule Take 20 mg by mouth 2 (two) times daily before a meal.    . terazosin (HYTRIN) 5 MG capsule Take 1 capsule by mouth at bedtime.    [name] testosterone (ANDROGEL) 50 MG/5GM GEL Place 5 g onto the skin daily.    . vitamin E 400 UNIT capsule Take 400 Units by mouth daily.    [name] spironolactone (ALDACTONE) 25 MG tablet Take 1 tablet (25 mg total) by mouth daily after breakfast. 30 tablet 6   No current facility-administered medications for this visit.    He is widowed per history children 4 grandchildren. There is no tobacco use. He does drink occasional alcohol.  ROS General: Negative; No fevers, chills, or night sweats;  HEENT: Positive for cracks in his molars; No changes in vision or hearing, sinus congestion, difficulty swallowing Pulmonary: Negative; No cough, wheezing, shortness of breath, hemoptysis Cardiovascular: Negative; No chest pain, presyncope, syncope, palpitations GI: Negative; No nausea, vomiting, diarrhea, or abdominal pain GU:  Negative; No dysuria, hematuria, or difficulty voiding Musculoskeletal: Positive for back pain with leg discomfort secondary to spinal stenosis. Hematologic/Oncology: Negative; no easy bruising, bleeding Endocrine: Negative; no heat/cold intolerance; no diabetes Neuro: Negative; no changes in balance, headaches Skin: history of removal of prior basal cell carcinoma with clear margins; No rashes or skin lesions Psychiatric: Negative; No behavioral problems, depression Sleep: mild sleep apnea, not on CPAP therapy; No  daytime sleepiness, hypersomnolence, bruxism, restless legs, hypnogognic hallucinations, no cataplexy Other comprehensive 14 point system review is negative.   PE BP 156/84 mmHg  Pulse 64  Ht _0  (1.676 m)  Wt 198 lb 12.8 oz (90.175 kg)  BMI 32.10 kg/m2   Wt Readings from Last 3 Encounters:  02/07/16 198 lb 12.8 oz (90.175 kg)  11/28/15 201 lb 12.8 oz (91.536 kg)  10/31/15 209 lb (94.802 kg)  General: Alert, oriented, no distress.  HEENT: Normocephalic, atraumatic. Pupils round and reactive; sclera anicteric;no lid lag,  Nose without nasal septal hypertrophy Mouth/Parynx benign; Mallinpatti scale 3 Neck: No JVD, no carotid bruits; normal carotid upstroke Lungs: clear to ausculatation and percussion; no wheezing or rales Chest: No chest wall discomfort Heart: regular rhythm with mild arrhythmia, s1 s2 normal  3-2/1 systolic murmur; no diastolic murmur. No rubs thrills or heaves. Abdomen: moderate central adiposity; soft, nontender; no hepatosplenomehaly, BS+; abdominal aorta nontender and not dilated by palpation. Back: No CVA tenderness Pulses 2+ Extremities: no clubbing cyanosis or edema, Homan's sign negative  Neurologic: grossly nonfocal Psychologic: Normal cognitive function, normal affect and mood  ECG (independently read by me): Sinus rhythm at 64 bpm.  PACs, nonspecific interventricular block.  First-degree AV block.  April 2017 ECG (independently read by  me): Normal sinus rhythm at 64 bpm with occasional PVC and sinus arrhythmia.  First-degree AV block with a PR interval at 278 ms.  Blocked APC.  January 2017 ECG (independently read by me):  Sinus rhythm marked first-degree block.there also was a blocked APC.   He was a suggestion of possible 2nd degree Wenkebach block.  July 2016 ECG (independently read by me): Sinus rhythm with first-degree AV block with a PR interval at 248 ms.  May 2016 ECG (independently read by me): Probable 2nd degree Wenkebach block  ECG (independently read by me): normal sinus rhythm with first-degree AV block with a PR interval at 230 milliseconds.  Prior March 2015ECG: (independently read by me) sinus rhythm at 73 beats per minute. First degree block with PR interval 248 ms. Isolated unifocal PVCs.   LABS:  BMP Latest Ref Rng 11/25/2015 10/28/2013 07/24/2013  Glucose 65 - 99 mg/dL 117(H) 135(H) 175(H)  BUN 7 - 25 mg/dL _0 Creatinine 0.70 - 1.11 mg/dL 1.11 1.43(H) 1.37(H)  Sodium 135 - 146 mmol/L 139 135(L) 138  Potassium 3.5 - 5.3 mmol/L 4.5 3.9 4.2  Chloride 98 - 110 mmol/L 99 101 103  CO2 20 - 31 mmol/L _1 Calcium 8.6 - 10.3 mg/dL 9.7 8.8 9.9   Hepatic Function Latest Ref Rng 11/25/2015 10/28/2013  Total Protein 6.1 - 8.1 g/dL 6.6 7.5  Albumin 3.6 - 5.1 g/dL 4.2 3.9  AST 10 - 35 U/L 29 35  ALT 9 - 46 U/L 26 26  Alk Phosphatase 40 - 115 U/L 59 30(L)  Total Bilirubin 0.2 - 1.2 mg/dL 0.6 0.5   CBC Latest Ref Rng 11/25/2015 10/28/2013  WBC 3.8 - 10.8 K/uL 7.5 5.8  Hemoglobin 13.2 - 17.1 g/dL 14.6 15.2  Hematocrit 38.5 - 50.0 % 44.6 44.6  Platelets 140 - 400 K/uL 227 230   Lab Results  Component Value Date   TSH 3.25 11/25/2015    Lab Results  Component Value Date   HGBA1C 6.8* 11/25/2015    Lipid Panel     Component Value Date/Time   CHOL 101* 11/25/2015 1343   TRIG 170* 11/25/2015 1343   HDL 42 11/25/2015 1343   CHOLHDL 2.4 11/25/2015 1343   VLDL 34* 11/25/2015 1343   LDLCALC 25  11/25/2015 1343     RADIOLOGY: No results found.    ASSESSMENT AND PLAN:  Peter Mullen is an 82 year old gentleman who is status post CABG revascularization surgery in 2001. At that time, a preoperative nuclear perfusion study was done prior to him undergoing potential bone marrow transplantation for his brother was abnormal and led to his catheterization  and ultimate detection of significant multivessel CAD. He was asymptomatic at that time without chest pain.  He has a history of hypertension and  has been demonstrated to have transient had second-degree AV block, type I.  He now is on a reduced dose of Toprol-XL at 50 mg in addition to Benicar 40 mg, HCTZ 25 mg, and Hytrin 5 mg. When I last saw him, I added amlodipine 5 mg to his medical regimen.  His blood pressure today continues to be elevated.  His renal function remains normal.  I'm electing to add low-dose aldosterone blockade with spironolactone 12.5 mg, which may be helpful for more optimal blood pressure control.  Will undergo repeat Bmet in 1-2 weeks.  He continues to take aspirin and Plavix.  There is no bleeding.  His lipids are very aggressively treated with his last LDL cholesterol at 25, although triglycerides and VLDL remain elevated at 170 and 34 on atorvastatin 20 mg and fenofibrate.  He is diabetic on metformin.  He will continue to monitor his blood pressure.  BMI is 32, compatible with mild obesity and weight reduction and increased exercise was recommended.  I will see him in 3 months for reevaluation.  Time spent: 25 minutes  Peter Sine, MD, [facility]  02/09/2016 5:25 PM

## 2016-02-11 ENCOUNTER — Telehealth: Payer: Self-pay | Admitting: Cardiovascular Disease

## 2016-02-11 MED ORDER — SPIRONOLACTONE 25 MG PO TABS: ORAL_TABLET | ORAL | Status: DC

## 2016-02-11 NOTE — Telephone Encounter (Signed)
Return pt call-pt states he was told to take 12.5 mg of Spironolactone daily by MD but his prescription states take 1 tab (25mg ) daily.   Clarified with pt per MD Claiborne Billings note he is suppose to take 12.5mg  (1/2 tablet) daily.    Per MD note: "I'm electing to add low-dose aldosterone blockade with spironolactone 12.5 mg, which may be helpful for more optimal blood pressure control".  Pt verbalized understanding.  He reports he has been taking 1/2 tablet daily as instructed by MD.     Prescription resent to pharmacy with correct instructions.

## 2016-02-11 NOTE — Telephone Encounter (Signed)
New message     Pt c/o medication issue:  1. Name of Medication: spironolactone  2. How are you currently taking this medication (dosage and times per day)? 25mg --1/2 tab daily 3. Are you having a reaction (difficulty breathing--STAT)? no 4. What is your medication issue?  Pt was seen recently.  Pt said the doctor told him to take 12.5 mg daily during the ov.  The presc was called in for 25mg  ----1 tab daily. He has been taking 1/2 tablet but should he be taking 1 25mg  tab daily?

## 2016-02-14 ENCOUNTER — Other Ambulatory Visit: Payer: Self-pay | Admitting: Cardiovascular Disease

## 2016-02-17 ENCOUNTER — Telehealth: Payer: Self-pay | Admitting: Cardiology

## 2016-02-17 NOTE — Telephone Encounter (Signed)
Pt called with HR to 47, BP was good at 113/46 and 139/78.  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  He is asymptomatic.  He will hold his toprol tomorrow until he can come in for EKG.  Pt prefers [facility] office.  If he becomes symptomatic he will come to ER.

## 2016-02-18 ENCOUNTER — Ambulatory Visit (INDEPENDENT_AMBULATORY_CARE_PROVIDER_SITE_OTHER): Payer: Medicare Other | Admitting: *Deleted

## 2016-02-18 DIAGNOSIS — R001 Bradycardia, unspecified: Secondary | ICD-10-CM

## 2016-02-18 NOTE — Progress Notes (Signed)
1.) Reason for visit: ekg  2.) Name of MD requesting visit: laura ingold np and dr Claiborne Billings  3.) H&P: pt has been checking his bp and pulse. He reports his pulse has been running in the 40's. He has a hx of 2nd degree AV block. ekg to confirm rhythm.  4.) ROS related to problem: pt denies any dizziness. occ lightheadedness that last only seconds, but rare.  5.) Assessment and plan per MD: ekg reviewed with chris berge np, pt with freq PVC's and occ 2nd degree block. No high grade block seen. Pt will continue to monitor. When he gets a pulse reading of 40 by his bp machine he is going to check his pulse radially. He will contact us with any palpated pulse in the 40's and for any dizziness or syncope. Pt agreed with this plan.

## 2016-02-18 NOTE — Telephone Encounter (Signed)
Spoke with pt, appointment scheduled for ECG today

## 2016-02-18 NOTE — Telephone Encounter (Signed)
ok 

## 2016-02-25 LAB — BASIC METABOLIC PANEL
BUN: 14 mg/dL (ref 7–25)
CO2: 23 mmol/L (ref 20–31)
Calcium: 9 mg/dL (ref 8.6–10.3)
Chloride: 104 mmol/L (ref 98–110)
Creat: 1.14 mg/dL — ABNORMAL HIGH (ref 0.70–1.11)
GLUCOSE: 139 mg/dL — AB (ref 65–99)
POTASSIUM: 3.9 mmol/L (ref 3.5–5.3)
SODIUM: 141 mmol/L (ref 135–146)

## 2016-05-14 ENCOUNTER — Encounter: Payer: Self-pay | Admitting: Cardiovascular Disease

## 2016-05-14 ENCOUNTER — Ambulatory Visit (INDEPENDENT_AMBULATORY_CARE_PROVIDER_SITE_OTHER): Payer: Medicare Other | Admitting: Cardiovascular Disease

## 2016-05-14 VITALS — BP 126/86 | HR 87 | Ht 66.0 in | Wt 204.2 lb

## 2016-05-14 DIAGNOSIS — I119 Hypertensive heart disease without heart failure: Secondary | ICD-10-CM

## 2016-05-14 DIAGNOSIS — I10 Essential (primary) hypertension: Secondary | ICD-10-CM | POA: Diagnosis not present

## 2016-05-14 DIAGNOSIS — I2510 Atherosclerotic heart disease of native coronary artery without angina pectoris: Secondary | ICD-10-CM | POA: Diagnosis not present

## 2016-05-14 DIAGNOSIS — E118 Type 2 diabetes mellitus with unspecified complications: Secondary | ICD-10-CM

## 2016-05-14 DIAGNOSIS — E785 Hyperlipidemia, unspecified: Secondary | ICD-10-CM

## 2016-05-14 DIAGNOSIS — I440 Atrioventricular block, first degree: Secondary | ICD-10-CM

## 2016-05-14 NOTE — Patient Instructions (Signed)
Your physician has requested that you have a lexiscan myoview and office visit in 6 months or sooner if needed.  If you need a refill on your cardiac medications before your next appointment, please call your pharmacy.

## 2016-05-14 NOTE — Progress Notes (Signed)
Patient ID: Peter Mullen, male   DOB: 09/19/1931, 82 y.o.   MRN: [id number]    Primary M.D.: Dr. Delfino Lovett Tisoivic  HPI: Peter Mullen is a 82 y.o. male who presents to the office today for a 3 month follow-up cardiology evaluation.  Chief complaint is that of increasing blood pressure.  [name] has established CAD and in March 2001 underwent CABG revascularization surgery with a LIMA to the LAD, SVG to diagonal, SVG to the PLA, and SVG to the PDA vessel. Additional medical problems include hypertension, type 2 diabetes mellitus, mild obstructive sleep apnea not on CPAP therapy and obesity.   In October 2012 an nuclear perfusion study showed normal perfusion with a post stress ejection fraction of 62%. He has documented grade 1 diastolic dysfunction EA ratio 0.71.   A 2-D echo irevealed mild aortic sclerosis without stenosis, trace MR and trace TR. He has a history of mixed hyperlipidemia. He sees Dr. [name] for his primary medical care. Laboratory showed a normal chemistry profile, cholesterol 93, triglycerides, 110 HDL 31, and LDL 40 on his current medical regimen.  A two-year followup nuclear perfusion study on 06/30/2013 was low risk and demonstrated apical thinning with increased attenuation on resting images without significant ischemia. He did have mild LV dysfunction with an ejection fraction of 47% and focal apical inferior hypocontractility.    On 09/20/2013 a follow-up echo Doppler study showed an ejection fraction of 55% without diagnostic regional wall motion abnormalities although septal bounce was noted. He had grade 1 diastolic dysfunction. He had moderate calcification of his aortic valve without stenosis and there was moderate calcification of the mitral annulus with mildly calcified leaflets without significant regurgitation. His left atrium was mildly dilated. He had normal pulmonary pressures.  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  At that  time, he had been on Toprol 100 mg daily and I reduced this to 50 mg per day.  He has continued to take losartan 100 mg, HCTZ in addition to terazocin for hypertension.   Earlier this year he had broken 2 of his molars and has been evaluated at the dental clinic at [facility].  He has been on chronic Plavix.  Recently, he had noticed that his blood pressure has been consistently elevated despite taking his vacations which include HCTZ 25 mg, losartan 100 mg, Toprol-XL 25 mg, and Hytrin 5 mg daily.  He denies any recent chest pain.  When I last saw him, I added amlodipine 5 mg to his medical regimen for improved blood pressure control.  He had held his Plavix for dental work on his molars.   Since I last saw him, he continues to feel well.  He specifically denies recurrent chest pain.  He has had some issues with allergies.  He denies recent palpitations or change in exercise tolerance.  He had blood work in June and July.  He presents for reevaluation.  He is status post removal of a a basal cell cancer removed from his left cheek by Dr. Sarajane Jews.   He has had issues with back and leg discomfort and was told by Dr. [name] that he has spinal stenosis, which has limited his walking.   Past medical history: CAD, status post CABG, hypertension, type 2 diabetes mellitus, mixed hyperlipidemia, obstructive sleep apnea, mild  Past surgical history notable for CABG surgery, cataract surgery 2005 prostate surgery 1989.    Current Outpatient Prescriptions  Medication Sig Dispense Refill  . Ascorbic Acid (  VITAMIN C) 1000 MG tablet Take 1,000 mg by mouth as needed.     [name] aspirin 81 MG tablet Take 81 mg by mouth daily.    [name] BIOTIN 5000 PO Take by mouth.    . cetirizine (ZYRTEC) 10 MG tablet Take 10 mg by mouth daily.    . Choline Fenofibrate (TRILIPIX) 135 MG capsule Take 135 mg by mouth daily.    . clopidogrel (PLAVIX) 75 MG tablet Take 75 mg by mouth daily.    . Cyanocobalamin (VITAMIN B 12 PO) Take 2,500 mcg  by mouth.    . diphenhydrAMINE (BENADRYL) 25 mg capsule Take 25 mg by mouth as needed.    . diphenhydramine-acetaminophen (TYLENOL PM) 25-500 MG TABS tablet Take 1 tablet by mouth at bedtime as needed.    . diphenoxylate-atropine (LOMOTIL) 2.5-0.025 MG per tablet Take 1 tablet by mouth 4 (four) times daily as needed for diarrhea or loose stools. 30 tablet 0  . folic acid (FOLVITE) 222 MCG tablet Take 400 mcg by mouth daily.    . hydrochlorothiazide (HYDRODIURIL) 25 MG tablet Take 25 mg by mouth daily.    [name] LIPITOR 40 MG tablet TAKE ONE-HALF TABLET BY MOUTH ONCE DAILY 90 tablet 0  . metFORMIN (GLUCOPHAGE) 1000 MG tablet Take 1,000 mg by mouth 2 (two) times daily with a meal.    . metoprolol succinate (TOPROL XL) 25 MG 24 hr tablet Take 1 tablet (25 mg total) by mouth daily. 90 tablet 3  . montelukast (SINGULAIR) 10 MG tablet Take 1 tablet by mouth at bedtime.    [name] olmesartan (BENICAR) 40 MG tablet Take 1 tablet (40 mg total) by mouth daily. 90 tablet 3  . Omega-3 Fat Ac-Cholecalciferol (MINICAPS VITAMIN-D/OMEGA-3) [phone] MG-UNIT CAPS Take 2,000 Int'l Units by mouth.    [name] omeprazole (PRILOSEC) 20 MG capsule Take 20 mg by mouth 2 (two) times daily before a meal.    . spironolactone (ALDACTONE) 25 MG tablet Take 12.5 mg (1/2 tablet) daily. 90 tablet 3  . terazosin (HYTRIN) 5 MG capsule Take 1 capsule by mouth at bedtime.    [name] testosterone (ANDROGEL) 50 MG/5GM GEL Place 5 g onto the skin daily.    . vitamin E 400 UNIT capsule Take 400 Units by mouth daily.     No current facility-administered medications for this visit.     He is widowed per history children 4 grandchildren. There is no tobacco use. He does drink occasional alcohol.  ROS General: Negative; No fevers, chills, or night sweats;  HEENT: Positive for cracks in his molars; No changes in vision or hearing, sinus congestion, difficulty swallowing Pulmonary: Negative; No cough, wheezing, shortness of breath, hemoptysis Cardiovascular:  Negative; No chest pain, presyncope, syncope, palpitations GI: Negative; No nausea, vomiting, diarrhea, or abdominal pain GU: Negative; No dysuria, hematuria, or difficulty voiding Musculoskeletal: Positive for back pain with leg discomfort secondary to spinal stenosis. Hematologic/Oncology: Negative; no easy bruising, bleeding Endocrine: Negative; no heat/cold intolerance; no diabetes Neuro: Negative; no changes in balance, headaches Skin: history of removal of prior basal cell carcinoma with clear margins; No rashes or skin lesions Psychiatric: Negative; No behavioral problems, depression Sleep: mild sleep apnea, not on CPAP therapy; No  daytime sleepiness, hypersomnolence, bruxism, restless legs, hypnogognic hallucinations, no cataplexy Other comprehensive 14 point system review is negative.   PE BP 126/86   Pulse 87   Ht '5\' 6"'  (1.676 m)   Wt 204 lb 3.2 oz (92.6 kg)   BMI 32.96 kg/m  Repeat blood pressure by me was 122/82.  Wt Readings from Last 3 Encounters:  05/14/16 204 lb 3.2 oz (92.6 kg)  02/07/16 198 lb 12.8 oz (90.2 kg)  11/28/15 201 lb 12.8 oz (91.5 kg)   General: Alert, oriented, no distress.  HEENT: Normocephalic, atraumatic. Pupils round and reactive; sclera anicteric;no lid lag,  Nose without nasal septal hypertrophy Mouth/Parynx benign; Mallinpatti scale 3 Neck: No JVD, no carotid bruits; normal carotid upstroke Lungs: clear to ausculatation and percussion; no wheezing or rales Chest: No chest wall discomfort Heart: regular rhythm with mild arrhythmia, s1 s2 normal  1-0/3 systolic murmur; no diastolic murmur. No rubs thrills or heaves. Abdomen: moderate central adiposity; soft, nontender; no hepatosplenomehaly, BS+; abdominal aorta nontender and not dilated by palpation. Back: No CVA tenderness Pulses 2+ Extremities: no clubbing cyanosis or edema, Homan's sign negative  Neurologic: grossly nonfocal Psychologic: Normal cognitive function, normal affect and  mood  ECG (independently read by me): Normal sinus rhythm with first-degree AV block.  PAC.  Left bundle branch block.  July 2017 ECG (independently read by me): Sinus rhythm at 64 bpm.  PACs, nonspecific interventricular block.  First-degree AV block.  April 2017 ECG (independently read by me): Normal sinus rhythm at 64 bpm with occasional PVC and sinus arrhythmia.  First-degree AV block with a PR interval at 278 ms.  Blocked APC.  January 2017 ECG (independently read by me):  Sinus rhythm marked first-degree block.there also was a blocked APC.   He was a suggestion of possible 2nd degree Wenkebach block.  July 2016 ECG (independently read by me): Sinus rhythm with first-degree AV block with a PR interval at 248 ms.  May 2016 ECG (independently read by me): Probable 2nd degree Wenkebach block  ECG (independently read by me): normal sinus rhythm with first-degree AV block with a PR interval at 230 milliseconds.  Prior March 2015ECG: (independently read by me) sinus rhythm at 73 beats per minute. First degree block with PR interval 248 ms. Isolated unifocal PVCs.   LABS:  BMP Latest Ref Rng & Units 02/24/2016 11/25/2015 10/28/2013  Glucose 65 - 99 mg/dL 139(H) 117(H) 135(H)  BUN 7 - 25 mg/dL '14 20 20  ' Creatinine 0.70 - 1.11 mg/dL 1.14(H) 1.11 1.43(H)  Sodium 135 - 146 mmol/L 141 139 135(L)  Potassium 3.5 - 5.3 mmol/L 3.9 4.5 3.9  Chloride 98 - 110 mmol/L 104 99 101  CO2 20 - 31 mmol/L '23 27 20  ' Calcium 8.6 - 10.3 mg/dL 9.0 9.7 8.8   Hepatic Function Latest Ref Rng & Units 11/25/2015 10/28/2013  Total Protein 6.1 - 8.1 g/dL 6.6 7.5  Albumin 3.6 - 5.1 g/dL 4.2 3.9  AST 10 - 35 U/L 29 35  ALT 9 - 46 U/L 26 26  Alk Phosphatase 40 - 115 U/L 59 30(L)  Total Bilirubin 0.2 - 1.2 mg/dL 0.6 0.5   CBC Latest Ref Rng & Units 11/25/2015 10/28/2013  WBC 3.8 - 10.8 K/uL 7.5 5.8  Hemoglobin 13.2 - 17.1 g/dL 14.6 15.2  Hematocrit 38.5 - 50.0 % 44.6 44.6  Platelets 140 - 400 K/uL 227 230   Lab Results    Component Value Date   TSH 3.25 11/25/2015    Lab Results  Component Value Date   HGBA1C 6.8 (H) 11/25/2015    Lipid Panel     Component Value Date/Time   CHOL 101 (L) 11/25/2015 1343   TRIG 170 (H) 11/25/2015 1343   HDL 42 11/25/2015 1343   CHOLHDL 2.4  11/25/2015 1343   VLDL 34 (H) 11/25/2015 1343   LDLCALC 25 11/25/2015 1343     RADIOLOGY: No results found.    ASSESSMENT AND PLAN:  [name] is an 82 year old gentleman who is status post CABG revascularization surgery in 2001. At that time, a preoperative nuclear perfusion study was done prior to him undergoing potential bone marrow transplantation for his brother was abnormal and led to his catheterization and ultimate detection of significant multivessel CAD. He was asymptomatic at that time without chest pain.  He has a history of hypertension and  has been demonstrated to have transient had second-degree AV block, type I.  He now is on a reduced dose of Toprol-XL at 50 mg in addition to Benicar 40 mg, HCTZ 25 mg, Hytrin 5 mg and amlodipine 5 mg.  when I last saw him, his blood pressure was still elevated and I added  low-dose aldosterone blockade with spironolactone 12.5 mg.  This addition has made a significant impact in his blood pressure is now normal.  I reviewed his lab work from June and July.  His creatinine following spironolactone remained fairly normal at 1.14.  He continues to be on atorvastatin and try lipid 135 mg for hyperlipidemia.  Average, oriented, in May revealed excellent total cholesterol and LDL cholesterol, but triglycerides and VLDL remains slightly elevated.  He is not having any bleeding with continuation of dual antiplatelet therapy.  His GERD is controlled with omeprazole.  He is on metformin for his diabetes mellitus.  His last nuclear stress test was in 2014.  Prior to his next six-month office visit, I have recommended he undergo a follow-up nuclear perfusion study, which will be 17 years status post  CABG revascularization surgery which was done in March 2001.  I will see him in follow-up.  Time spent: 25 minutes  Troy Sine, MD, [facility]  05/14/2016 7:02 PM

## 2016-05-26 ENCOUNTER — Telehealth: Payer: Self-pay | Admitting: *Deleted

## 2016-05-26 NOTE — Telephone Encounter (Signed)
Pt is needing crown, bridge work and possible periodontal surgery and they are asking if okay to hold aspirin and plavix prior to the procedure. Will forward for dr [facility] review and advise.

## 2016-05-27 NOTE — Telephone Encounter (Signed)
Ok to hold for 5 days

## 2016-05-28 NOTE — Telephone Encounter (Signed)
Will fax this note to the number provided. 

## 2016-08-04 ENCOUNTER — Other Ambulatory Visit: Payer: Self-pay | Admitting: Cardiovascular Disease

## 2016-10-29 ENCOUNTER — Telehealth (HOSPITAL_COMMUNITY): Payer: Self-pay

## 2016-10-29 NOTE — Telephone Encounter (Signed)
Encounter complete. 

## 2016-11-03 ENCOUNTER — Ambulatory Visit (HOSPITAL_COMMUNITY)
Admission: RE | Admit: 2016-11-03 | Discharge: 2016-11-03 | Disposition: A | Discharge status: Home / Self Care | Payer: Medicare Other | Source: Ambulatory Visit | Attending: Cardiovascular Disease | Admitting: Cardiovascular Disease

## 2016-11-03 DIAGNOSIS — I2510 Atherosclerotic heart disease of native coronary artery without angina pectoris: Secondary | ICD-10-CM | POA: Diagnosis present

## 2016-11-03 DIAGNOSIS — I10 Essential (primary) hypertension: Secondary | ICD-10-CM | POA: Insufficient documentation

## 2016-11-03 DIAGNOSIS — R9439 Abnormal result of other cardiovascular function study: Secondary | ICD-10-CM | POA: Diagnosis not present

## 2016-11-03 LAB — MYOCARDIAL PERFUSION IMAGING
CHL CUP RESTING HR STRESS: 68 {beats}/min
LVDIAVOL: 88 mL (ref 62–150)
LVSYSVOL: 40 mL
NUC STRESS TID: 1.07
Peak HR: 89 {beats}/min
SDS: 1
SRS: 7
SSS: 8

## 2016-11-03 MED ORDER — TECHNETIUM TC 99M TETROFOSMIN IV KIT
10.3000 | PACK | Freq: Once | INTRAVENOUS | Status: AC | PRN
  Administered 2016-11-03: 10.3 via INTRAVENOUS
  Filled 2016-11-03: qty 11

## 2016-11-03 MED ORDER — REGADENOSON 0.4 MG/5ML IV SOLN
0.4000 mg | Freq: Once | INTRAVENOUS | Status: AC
  Administered 2016-11-03: 0.4 mg via INTRAVENOUS

## 2016-11-03 MED ORDER — TECHNETIUM TC 99M TETROFOSMIN IV KIT
31.0000 | PACK | Freq: Once | INTRAVENOUS | Status: AC | PRN
  Administered 2016-11-03: 31 via INTRAVENOUS
  Filled 2016-11-03: qty 31

## 2016-11-06 ENCOUNTER — Ambulatory Visit (INDEPENDENT_AMBULATORY_CARE_PROVIDER_SITE_OTHER): Payer: Medicare Other | Admitting: Cardiovascular Disease

## 2016-11-06 ENCOUNTER — Encounter: Payer: Self-pay | Admitting: Cardiovascular Disease

## 2016-11-06 VITALS — BP 108/70 | HR 68 | Ht 66.0 in | Wt 194.6 lb

## 2016-11-06 DIAGNOSIS — I2510 Atherosclerotic heart disease of native coronary artery without angina pectoris: Secondary | ICD-10-CM | POA: Diagnosis not present

## 2016-11-06 DIAGNOSIS — I440 Atrioventricular block, first degree: Secondary | ICD-10-CM

## 2016-11-06 DIAGNOSIS — I10 Essential (primary) hypertension: Secondary | ICD-10-CM

## 2016-11-06 DIAGNOSIS — E785 Hyperlipidemia, unspecified: Secondary | ICD-10-CM | POA: Diagnosis not present

## 2016-11-06 DIAGNOSIS — E118 Type 2 diabetes mellitus with unspecified complications: Secondary | ICD-10-CM

## 2016-11-06 NOTE — Progress Notes (Signed)
Patient ID: Peter Mullen, male   DOB: Dec 07, 1931, 83 y.o.   MRN: [id number]    Primary M.D.: Dr. Delfino Lovett Tisoivic  HPI: Peter Mullen is a 83 y.o. male who presents to the office today for a 6 month follow-up cardiology evaluation.    Peter Mullen has established CAD and in March 2001 underwent CABG revascularization surgery with a LIMA to the LAD, SVG to diagonal, SVG to the PLA, and SVG to the PDA vessel. Additional medical problems include hypertension, type 2 diabetes mellitus, mild obstructive sleep apnea not on CPAP therapy and obesity.   In October 2012 an nuclear perfusion study showed normal perfusion with a post stress ejection fraction of 62%. He has documented grade 1 diastolic dysfunction EA ratio 0.71.   A 2-D echo irevealed mild aortic sclerosis without stenosis, trace MR and trace TR. He has a history of mixed hyperlipidemia. He sees Dr. [name] for his primary medical care. Laboratory showed a normal chemistry profile, cholesterol 93, triglycerides, 110 HDL 31, and LDL 40 on his current medical regimen.  A two-year followup nuclear perfusion study on 06/30/2013 was low risk and demonstrated apical thinning with increased attenuation on resting images without significant ischemia. He did have mild LV dysfunction with an ejection fraction of 47% and focal apical inferior hypocontractility.    On 09/20/2013 a follow-up echo Doppler study showed an ejection fraction of 55% without diagnostic regional wall motion abnormalities although septal bounce was noted. He had grade 1 diastolic dysfunction. He had moderate calcification of his aortic valve without stenosis and there was moderate calcification of the mitral annulus with mildly calcified leaflets without significant regurgitation. His left atrium was mildly dilated. He had normal pulmonary pressures.  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  At that time, he had been on Toprol 100 mg daily and I  reduced this to 50 mg per day.  He has continued to take losartan 100 mg, HCTZ in addition to terazocin for hypertension.   In 2017 he had broken 2 of his molars and has been evaluated at the dental clinic at [facility].  He has been on chronic Plavix.  He had noticed that his blood pressure has been consistently elevated despite taking his vacations which include HCTZ 25 mg, losartan 100 mg, Toprol-XL 25 mg, and Hytrin 5 mg daily.  I added amlodipine 5 mg to his medical regimen for improved blood pressure control.  He had held his Plavix for dental work on his molars.   Since I last saw him, he continues to feel well.  He specifically denies recurrent chest pain.  He is remaining active and typically bowls 2 times per week.  He has been having occasional pain down his left leg which may be attributable to spinal stenosis and in the past he had seen Dr. [name] for this, which had limited his walking.  He will need to have a molar extraction as well as an impacted was some tooth removed at the [place] [facility], which is scheduled for later this month.  He underwent a nuclear perfusion study on 11/03/2016 in follow-up of his remote CABG surgery and this continued to be low risk and showed only a very small prior basal/mid inferoseptal defect without associated ischemia.  Ejection fraction was 55% and he had normal wall motion.  He presents for evaluation  He is status post removal of a a basal cell cancer removed from his left cheek by Dr. Sarajane Jews.  He has had issues with back and leg discomfort and was told by Dr. [name] that he has spinal stenosis, which has limited his walking.   Past medical history: CAD, status post CABG, hypertension, type 2 diabetes mellitus, mixed hyperlipidemia, obstructive sleep apnea, mild  Past surgical history notable for CABG surgery, cataract surgery 2005 prostate surgery 1989.    Current Outpatient Prescriptions  Medication Sig Dispense Refill  . Ascorbic Acid (VITAMIN  C) 1000 MG tablet Take 1,000 mg by mouth as needed.     [name] aspirin 81 MG tablet Take 81 mg by mouth daily.    [name] BIOTIN 5000 PO Take by mouth.    . cetirizine (ZYRTEC) 10 MG tablet Take 10 mg by mouth daily.    . Choline Fenofibrate (TRILIPIX) 135 MG capsule Take 135 mg by mouth daily.    . clopidogrel (PLAVIX) 75 MG tablet Take 75 mg by mouth daily.    . Cyanocobalamin (VITAMIN B 12 PO) Take 2,500 mcg by mouth.    . diphenhydrAMINE (BENADRYL) 25 mg capsule Take 25 mg by mouth as needed.    . diphenhydramine-acetaminophen (TYLENOL PM) 25-500 MG TABS tablet Take 1 tablet by mouth at bedtime as needed.    . diphenoxylate-atropine (LOMOTIL) 2.5-0.025 MG per tablet Take 1 tablet by mouth 4 (four) times daily as needed for diarrhea or loose stools. 30 tablet 0  . fluticasone (FLONASE) 50 MCG/ACT nasal spray Place 1 spray into both nostrils daily.    . folic acid (FOLVITE) 027 MCG tablet Take 400 mcg by mouth daily.    . hydrochlorothiazide (HYDRODIURIL) 25 MG tablet Take 25 mg by mouth daily.    [name] LIPITOR 40 MG tablet TAKE ONE-HALF TABLET BY MOUTH ONCE DAILY 90 tablet 0  . metFORMIN (GLUCOPHAGE) 500 MG tablet Take 500 mg by mouth 2 (two) times daily with a meal.     . metoprolol succinate (TOPROL-XL) 25 MG 24 hr tablet TAKE ONE TABLET BY MOUTH ONCE DAILY 90 tablet 3  . montelukast (SINGULAIR) 10 MG tablet Take 1 tablet by mouth at bedtime.    [name] olmesartan (BENICAR) 40 MG tablet Take 1 tablet (40 mg total) by mouth daily. 90 tablet 3  . Omega-3 Fat Ac-Cholecalciferol (MINICAPS VITAMIN-D/OMEGA-3) [phone] MG-UNIT CAPS Take 2,000 Int'l Units by mouth.    [name] omeprazole (PRILOSEC) 20 MG capsule Take 20 mg by mouth 2 (two) times daily before a meal.    . spironolactone (ALDACTONE) 25 MG tablet Take 12.5 mg (1/2 tablet) daily. 90 tablet 3  . terazosin (HYTRIN) 5 MG capsule Take 1 capsule by mouth at bedtime.    [name] testosterone (ANDROGEL) 50 MG/5GM GEL Place 5 g onto the skin daily.    . vitamin E 400 UNIT  capsule Take 400 Units by mouth daily.     No current facility-administered medications for this visit.     He is widowed per history children 4 grandchildren. There is no tobacco use. He does drink occasional alcohol.  ROS General: Negative; No fevers, chills, or night sweats;  HEENT: Positive for cracks in his molars; No changes in vision or hearing, sinus congestion, difficulty swallowing Pulmonary: Negative; No cough, wheezing, shortness of breath, hemoptysis Cardiovascular: Negative; No chest pain, presyncope, syncope, palpitations GI: Negative; No nausea, vomiting, diarrhea, or abdominal pain GU: Negative; No dysuria, hematuria, or difficulty voiding Musculoskeletal: Positive for back pain with leg discomfort secondary to spinal stenosis. Hematologic/Oncology: Negative; no easy bruising, bleeding Endocrine: Negative; no heat/cold intolerance; no diabetes Neuro:  Negative; no changes in balance, headaches Skin: history of removal of prior basal cell carcinoma with clear margins; No rashes or skin lesions Psychiatric: Negative; No behavioral problems, depression Sleep: mild sleep apnea, not on CPAP therapy; No  daytime sleepiness, hypersomnolence, bruxism, restless legs, hypnogognic hallucinations, no cataplexy Other comprehensive 14 point system review is negative.   PE BP 108/70   Pulse 68   Ht '5\' 6"'  (1.676 m)   Wt 194 lb 9.6 oz (88.3 kg)   BMI 31.41 kg/m    Repeat blood pressure by me was 120/74.  Wt Readings from Last 3 Encounters:  11/06/16 194 lb 9.6 oz (88.3 kg)  11/03/16 204 lb (92.5 kg)  05/14/16 204 lb 3.2 oz (92.6 kg)   General: Alert, oriented, no distress.  HEENT: Normocephalic, atraumatic. Pupils round and reactive; sclera anicteric;no lid lag,  Nose without nasal septal hypertrophy Mouth/Parynx benign; He has temporary upper crowns Mallinpatti scale 3 Neck: No JVD, no carotid bruits; normal carotid upstroke Lungs: clear to ausculatation and percussion; no  wheezing or rales Chest: No chest wall discomfort Heart: regular rhythm with mild arrhythmia, s1 s2 normal  7-9/3 systolic murmur; no diastolic murmur. No rubs thrills or heaves. Abdomen: moderate central adiposity; soft, nontender; no hepatosplenomehaly, BS+; abdominal aorta nontender and not dilated by palpation. Back: No CVA tenderness Pulses 2+ Extremities: no clubbing cyanosis or edema, Homan's sign negative  Neurologic: grossly nonfocal Psychologic: Normal cognitive function, normal affect and mood  ECG not done today since patient had a stress test earlier this week.  ECG was reviewed from the stress study.  October 2017 ECG (independently read by me): Normal sinus rhythm with first-degree AV block.  PAC.  Left bundle branch block.  July 2017 ECG (independently read by me): Sinus rhythm at 64 bpm.  PACs, nonspecific interventricular block.  First-degree AV block.  April 2017 ECG (independently read by me): Normal sinus rhythm at 64 bpm with occasional PVC and sinus arrhythmia.  First-degree AV block with a PR interval at 278 ms.  Blocked APC.  January 2017 ECG (independently read by me):  Sinus rhythm marked first-degree block.there also was a blocked APC.   He was a suggestion of possible 2nd degree Wenkebach block.  July 2016 ECG (independently read by me): Sinus rhythm with first-degree AV block with a PR interval at 248 ms.  May 2016 ECG (independently read by me): Probable 2nd degree Wenkebach block  ECG (independently read by me): normal sinus rhythm with first-degree AV block with a PR interval at 230 milliseconds.  Prior March 2015ECG: (independently read by me) sinus rhythm at 73 beats per minute. First degree block with PR interval 248 ms. Isolated unifocal PVCs.   LABS:  BMP Latest Ref Rng & Units 02/24/2016 11/25/2015 10/28/2013  Glucose 65 - 99 mg/dL 139(H) 117(H) 135(H)  BUN 7 - 25 mg/dL '14 20 20  ' Creatinine 0.70 - 1.11 mg/dL 1.14(H) 1.11 1.43(H)  Sodium 135 - 146  mmol/L 141 139 135(L)  Potassium 3.5 - 5.3 mmol/L 3.9 4.5 3.9  Chloride 98 - 110 mmol/L 104 99 101  CO2 20 - 31 mmol/L '23 27 20  ' Calcium 8.6 - 10.3 mg/dL 9.0 9.7 8.8   Hepatic Function Latest Ref Rng & Units 11/25/2015 10/28/2013  Total Protein 6.1 - 8.1 g/dL 6.6 7.5  Albumin 3.6 - 5.1 g/dL 4.2 3.9  AST 10 - 35 U/L 29 35  ALT 9 - 46 U/L 26 26  Alk Phosphatase 40 - 115 U/L 59  30(L)  Total Bilirubin 0.2 - 1.2 mg/dL 0.6 0.5   CBC Latest Ref Rng & Units 11/25/2015 10/28/2013  WBC 3.8 - 10.8 K/uL 7.5 5.8  Hemoglobin 13.2 - 17.1 g/dL 14.6 15.2  Hematocrit 38.5 - 50.0 % 44.6 44.6  Platelets 140 - 400 K/uL 227 230   Lab Results  Component Value Date   TSH 3.25 11/25/2015    Lab Results  Component Value Date   HGBA1C 6.8 (H) 11/25/2015    Lipid Panel     Component Value Date/Time   CHOL 101 (L) 11/25/2015 1343   TRIG 170 (H) 11/25/2015 1343   HDL 42 11/25/2015 1343   CHOLHDL 2.4 11/25/2015 1343   VLDL 34 (H) 11/25/2015 1343   LDLCALC 25 11/25/2015 1343     RADIOLOGY: No results found.  IMPRESSION:  1. CAD in native artery   2. Essential hypertension   3. Hyperlipidemia with target LDL less than 70   4. Hyperlipidemia LDL goal <70   5. First degree heart block   6. Type 2 diabetes mellitus with complication, without long-term current use of insulin (HCC)     ASSESSMENT AND PLAN:  Peter Mullen is an 83 year old gentleman who is status post CABG revascularization surgery in 2001. At that time, a preoperative nuclear perfusion study was done prior to him undergoing potential bone marrow transplantation for his brother was abnormal and led to his catheterization and ultimate detection of significant multivessel CAD. He was asymptomatic at that time without chest pain.  He has a history of hypertension and  has been demonstrated to have transient had second-degree AV block, type I.  He is on a reduced dose of Toprol-XL at 25 mg in addition to Benicar 40 mg, HCTZ 25 mg, Hytrin 5 mg and  amlodipine 5 mg. his blood pressure lability significantly improved with the addition of spironolactone to his medical regimen.  He continues to be on atorvastatin 20 mg for hyperlipidemia with target LDL less than 70.  In May 2017.  This was excellent at 25.  He has continued to be on aspirin and Plavix for antiplatelet therapy.  He is in need for extraction of an infected molar and underneath this molar isn't impacted was some tooth, which will also need to be removed.  I recommended that he hold his aspirin and Plavix for at least 5 days prior to the procedure and have given clearance to undergo this.  He ultimately will having dental implants.  I reviewed his most recent nuclear perfusion study with him in detail.  This remains stable and does not show any ischemia.  At times he is bothered by left leg discomfort and in the past.  He was diagnosed as having spinal stenosis.  If this progresses I have suggested a follow-up appointment with Dr. Lindwood Qua.  He has lost weight.  He states on his scale at home.  He had loss at least 13 pounds.  BMI today is still increased at 31.41 and is consistent with mild obesity.  He is diabetic and had issues with long-acting metformin regarding obstruction such that he would find a whole pill in the toilet.  He is now on short acting metformin at 500 mg twice a day and is tolerating this well.  I will see him in 6 months for cardiology reevaluation.  Time spent: 25 minutes  Troy Sine, MD, [facility]  11/06/2016 12:36 PM

## 2016-11-06 NOTE — Patient Instructions (Signed)
Your physician wants you to follow-up in: 6 months or sooner if needed. You will receive a reminder letter in the mail two months in advance. If you don't receive a letter, please call our office to schedule the follow-up appointment.   If you need a refill on your cardiac medications before your next appointment, please call your pharmacy. 

## 2016-11-06 NOTE — Addendum Note (Signed)
Addended by: Ulice Brilliant T on: 11/06/2016 03:21 PM   Modules accepted: Orders

## 2016-11-06 NOTE — Addendum Note (Signed)
Addended by: Ulice Brilliant T on: 11/06/2016 03:29 PM   Modules accepted: Orders

## 2016-11-09 ENCOUNTER — Telehealth: Payer: Self-pay | Admitting: *Deleted

## 2016-11-09 NOTE — Telephone Encounter (Signed)
Patient notified of results and recommendations.

## 2016-11-09 NOTE — Telephone Encounter (Signed)
-----   Message from Troy Sine, MD sent at 11/09/2016  9:45 AM EDT ----- Low risk nuc; small defect; no ischemia;  Continue med rx

## 2016-11-30 ENCOUNTER — Other Ambulatory Visit: Payer: Self-pay | Admitting: Cardiovascular Disease

## 2017-01-18 ENCOUNTER — Other Ambulatory Visit: Payer: Self-pay | Admitting: Cardiovascular Disease

## 2017-02-22 ENCOUNTER — Other Ambulatory Visit: Payer: Self-pay | Admitting: Cardiovascular Disease

## 2017-03-18 ENCOUNTER — Telehealth: Payer: Self-pay | Admitting: Cardiovascular Disease

## 2017-03-18 NOTE — Telephone Encounter (Signed)
Discussed with dr hochrein, okay for patient to hold plavix prior to extraction and restart the day after. This note will be faxed to the number provided.

## 2017-03-18 NOTE — Telephone Encounter (Signed)
New message    1. What dental office are you calling from? [facility]  2. What is your office phone and fax number? [phone]  3. What type of procedure is the patient having performed? Tooth Extraction   4. What date is procedure scheduled? Not scheduled yet   5. What is your question (ex. Antibiotics prior to procedure, holding medication-we need to know how long dentist wants pt to hold med)? Clopidogrel-hold 5 days before.

## 2017-06-10 ENCOUNTER — Other Ambulatory Visit: Payer: Self-pay | Admitting: Cardiovascular Disease

## 2017-06-23 ENCOUNTER — Ambulatory Visit: Payer: Medicare Other | Admitting: Cardiovascular Disease

## 2017-06-23 ENCOUNTER — Encounter: Payer: Self-pay | Admitting: Cardiovascular Disease

## 2017-06-23 VITALS — BP 120/82 | HR 66 | Ht 66.0 in | Wt 209.0 lb

## 2017-06-23 DIAGNOSIS — I440 Atrioventricular block, first degree: Secondary | ICD-10-CM

## 2017-06-23 DIAGNOSIS — E118 Type 2 diabetes mellitus with unspecified complications: Secondary | ICD-10-CM

## 2017-06-23 DIAGNOSIS — I10 Essential (primary) hypertension: Secondary | ICD-10-CM

## 2017-06-23 DIAGNOSIS — I2510 Atherosclerotic heart disease of native coronary artery without angina pectoris: Secondary | ICD-10-CM

## 2017-06-23 DIAGNOSIS — E785 Hyperlipidemia, unspecified: Secondary | ICD-10-CM

## 2017-06-23 DIAGNOSIS — Z951 Presence of aortocoronary bypass graft: Secondary | ICD-10-CM

## 2017-06-23 DIAGNOSIS — I119 Hypertensive heart disease without heart failure: Secondary | ICD-10-CM | POA: Diagnosis not present

## 2017-06-23 NOTE — Progress Notes (Signed)
Patient ID: Peter Mullen, male   DOB: 02/14/1932, 83 y.o.   MRN: [id number]    Primary M.D.: Dr. Delfino Lovett Tisoivic  HPI: Peter Mullen is a 83 y.o. male who presents to the office today for a 7 month follow-up cardiology evaluation.    [name] has established CAD and in March 2001 underwent CABG revascularization surgery with a LIMA to the LAD, SVG to diagonal, SVG to the PLA, and SVG to the PDA vessel. Additional medical problems include hypertension, type 2 diabetes mellitus, mild obstructive sleep apnea not on CPAP therapy and obesity.   In October 2012 an nuclear perfusion study showed normal perfusion with a post stress ejection fraction of 62%. He has documented grade 1 diastolic dysfunction EA ratio 0.71.   A 2-D echo irevealed mild aortic sclerosis without stenosis, trace MR and trace TR. He has a history of mixed hyperlipidemia. He sees Dr. [name] for his primary medical care. Laboratory showed a normal chemistry profile, cholesterol 93, triglycerides, 110 HDL 31, and LDL 40 on his current medical regimen.  A two-year followup nuclear perfusion study on 06/30/2013 was low risk and demonstrated apical thinning with increased attenuation on resting images without significant ischemia. He did have mild LV dysfunction with an ejection fraction of 47% and focal apical inferior hypocontractility.    On 09/20/2013 a follow-up echo Doppler study showed an ejection fraction of 55% without diagnostic regional wall motion abnormalities although septal bounce was noted. He had grade 1 diastolic dysfunction. He had moderate calcification of his aortic valve without stenosis and there was moderate calcification of the mitral annulus with mildly calcified leaflets without significant regurgitation. His left atrium was mildly dilated. He had normal pulmonary pressures.  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  At that time, he had been on Toprol 100 mg daily and I  reduced this to 50 mg per day.  He has continued to take losartan 100 mg, HCTZ in addition to terazocin for hypertension.   In 2017 he had broken 2 of his molars and has been evaluated at the dental clinic at [facility].  He has been on chronic Plavix.  He had noticed that his blood pressure has been consistently elevated despite taking his vacations which include HCTZ 25 mg, losartan 100 mg, Toprol-XL 25 mg, and Hytrin 5 mg daily.  I added amlodipine 5 mg to his medical regimen for improved blood pressure control.  He had held his Plavix for dental work on his molars.   He is remaining active and typically bowls 2 times per week.  He has been having occasional pain down his left leg which may be attributable to spinal stenosis and in the past he had seen Dr. [name] for this, which had limited his walking.  He will need to have a molar extraction as well as an impacted was some tooth removed at the [facility], which is scheduled for later this month.  He underwent a nuclear perfusion study on 11/03/2016 in follow-up of his remote CABG surgery and this continued to be low risk and showed only a very small prior basal/mid inferoseptal defect without associated ischemia.  Ejection fraction was 55% and he had normal wall motion.    Since I last saw him in April 2018, he continues to have some muscle pain in his leg.  He underwent a nuclear perfusion study in April 2018, which remained low risk and showed a small defect in the basal inferoseptal  and mid inferoseptal region consistent with probable scar without associated ischemia.  EF was 55% with normal wall motion.  He denies any anginal type symptoms.  He is now bowling 3 times per week.  He had laboratory in June 2018 which showed total cholesterol 122, HDL 38, LDL 39, but triglycerides were elevated at 224.  Hemoglobin A1c was 6.2.  SH was 3.25.   He presents for reevaluation.   Past medical history: CAD, status post CABG, hypertension, type 2 diabetes  mellitus, mixed hyperlipidemia, obstructive sleep apnea, mild. He is status post removal of a a basal cell cancer removed from his left cheek by Dr. Sarajane Jews.    Past surgical history notable for CABG surgery, cataract surgery 2005 prostate surgery 1989.  Current Outpatient Medications  Medication Sig Dispense Refill  . Ascorbic Acid (VITAMIN C) 1000 MG tablet Take 1,000 mg by mouth as needed.     [name] aspirin 81 MG tablet Take 81 mg by mouth daily.    [name] BIOTIN 5000 PO Take by mouth.    . cetirizine (ZYRTEC) 10 MG tablet Take 10 mg by mouth daily.    . Choline Fenofibrate (TRILIPIX) 135 MG capsule Take 135 mg by mouth daily.    . clopidogrel (PLAVIX) 75 MG tablet Take 75 mg by mouth daily.    . Cyanocobalamin (VITAMIN B 12 PO) Take 2,500 mcg by mouth.    . diphenhydrAMINE (BENADRYL) 25 mg capsule Take 25 mg by mouth as needed.    . diphenhydramine-acetaminophen (TYLENOL PM) 25-500 MG TABS tablet Take 1 tablet by mouth at bedtime as needed.    . diphenoxylate-atropine (LOMOTIL) 2.5-0.025 MG per tablet Take 1 tablet by mouth 4 (four) times daily as needed for diarrhea or loose stools. 30 tablet 0  . fluticasone (FLONASE) 50 MCG/ACT nasal spray Place 1 spray into both nostrils daily.    . folic acid (FOLVITE) 888 MCG tablet Take 400 mcg by mouth daily.    . hydrochlorothiazide (HYDRODIURIL) 25 MG tablet Take 25 mg by mouth daily.    [name] LIPITOR 40 MG tablet TAKE ONE-HALF TABLET BY MOUTH ONCE DAILY 45 tablet 3  . metFORMIN (GLUCOPHAGE) 500 MG tablet Take 500 mg by mouth 2 (two) times daily with a meal.     . metoprolol succinate (TOPROL-XL) 25 MG 24 hr tablet TAKE ONE TABLET BY MOUTH ONCE DAILY 90 tablet 2  . montelukast (SINGULAIR) 10 MG tablet Take 1 tablet by mouth at bedtime.    [name] olmesartan (BENICAR) 40 MG tablet TAKE ONE TABLET BY MOUTH ONCE DAILY 90 tablet 3  . Omega-3 Fatty Acids (FISH OIL PO) Take by mouth.    [name] omeprazole (PRILOSEC) 20 MG capsule Take 20 mg by mouth 2 (two) times daily  before a meal.    . spironolactone (ALDACTONE) 25 MG tablet TAKE ONE TABLET BY MOUTH ONCE DAILY AFTER BREAKFAST 30 tablet 5  . terazosin (HYTRIN) 5 MG capsule Take 1 capsule by mouth at bedtime.    [name] testosterone (ANDROGEL) 50 MG/5GM GEL Place 5 g onto the skin daily.    . vitamin E 400 UNIT capsule Take 400 Units by mouth daily.     No current facility-administered medications for this visit.     He is widowed per history children 4 grandchildren. There is no tobacco use. He does drink occasional alcohol.  ROS General: Negative; No fevers, chills, or night sweats;  HEENT: Positive for cracks in his molars; No changes in vision or hearing, sinus congestion,  difficulty swallowing Pulmonary: Negative; No cough, wheezing, shortness of breath, hemoptysis Cardiovascular: Negative; No chest pain, presyncope, syncope, palpitations GI: Negative; No nausea, vomiting, diarrhea, or abdominal pain GU: Negative; No dysuria, hematuria, or difficulty voiding Musculoskeletal: Positive for back pain with leg discomfort secondary to spinal stenosis. Hematologic/Oncology: Negative; no easy bruising, bleeding Endocrine: Negative; no heat/cold intolerance; no diabetes Neuro: Negative; no changes in balance, headaches Skin: history of removal of prior basal cell carcinoma with clear margins; No rashes or skin lesions Psychiatric: Negative; No behavioral problems, depression Sleep: mild sleep apnea, not on CPAP therapy; No  daytime sleepiness, hypersomnolence, bruxism, restless legs, hypnogognic hallucinations, no cataplexy Other comprehensive 14 point system review is negative.   PE BP 120/82   Pulse 66   Ht '5\' 6"'  (1.676 m)   Wt 209 lb (94.8 kg)   BMI 33.73 kg/m    Repeat blood pressure by me was 132/80  Wt Readings from Last 3 Encounters:  06/23/17 209 lb (94.8 kg)  11/06/16 194 lb 9.6 oz (88.3 kg)  11/03/16 204 lb (92.5 kg)    General: Alert, oriented, no distress.  Skin: normal turgor, no  rashes, warm and dry HEENT: Normocephalic, atraumatic. Pupils equal round and reactive to light; sclera anicteric; extraocular muscles intact;  Nose without nasal septal hypertrophy Mouth/Parynx benign; Mallinpatti scale 3 Neck: No JVD, no carotid bruits; normal carotid upstroke Lungs: clear to ausculatation and percussion; no wheezing or rales Chest wall: without tenderness to palpitation Heart: PMI not displaced, RRR, s1 s2 normal, 3-4/0 systolic murmur, no diastolic murmur, no rubs, gallops, thrills, or heaves Abdomen: Moderate central adiposity soft, nontender; no hepatosplenomehaly, BS+; abdominal aorta nontender and not dilated by palpation. Back: no CVA tenderness Pulses 2+ Musculoskeletal: full range of motion, normal strength, no joint deformities Extremities: no clubbing cyanosis or edema, Homan's sign negative  Neurologic: grossly nonfocal; Cranial nerves grossly wnl Psychologic: Normal mood and affect   ECG (independently read by me): Difficult to discern but probable sinus rhythm with PACs  October 2017 ECG (independently read by me): Normal sinus rhythm with first-degree AV block.  PAC.  Left bundle branch block.  July 2017 ECG (independently read by me): Sinus rhythm at 64 bpm.  PACs, nonspecific interventricular block.  First-degree AV block.  April 2017 ECG (independently read by me): Normal sinus rhythm at 64 bpm with occasional PVC and sinus arrhythmia.  First-degree AV block with a PR interval at 278 ms.  Blocked APC.  January 2017 ECG (independently read by me):  Sinus rhythm marked first-degree block.there also was a blocked APC.   He was a suggestion of possible 2nd degree Wenkebach block.  July 2016 ECG (independently read by me): Sinus rhythm with first-degree AV block with a PR interval at 248 ms.  May 2016 ECG (independently read by me): Probable 2nd degree Wenkebach block  ECG (independently read by me): normal sinus rhythm with first-degree AV block with a  PR interval at 230 milliseconds.  Prior March 2015ECG: (independently read by me) sinus rhythm at 73 beats per minute. First degree block with PR interval 248 ms. Isolated unifocal PVCs.   LABS:  BMP Latest Ref Rng & Units 02/24/2016 11/25/2015 10/28/2013  Glucose 65 - 99 mg/dL 139(H) 117(H) 135(H)  BUN 7 - 25 mg/dL '14 20 20  ' Creatinine 0.70 - 1.11 mg/dL 1.14(H) 1.11 1.43(H)  Sodium 135 - 146 mmol/L 141 139 135(L)  Potassium 3.5 - 5.3 mmol/L 3.9 4.5 3.9  Chloride 98 - 110 mmol/L 104 99  101  CO2 20 - 31 mmol/L '23 27 20  ' Calcium 8.6 - 10.3 mg/dL 9.0 9.7 8.8   Hepatic Function Latest Ref Rng & Units 11/25/2015 10/28/2013  Total Protein 6.1 - 8.1 g/dL 6.6 7.5  Albumin 3.6 - 5.1 g/dL 4.2 3.9  AST 10 - 35 U/L 29 35  ALT 9 - 46 U/L 26 26  Alk Phosphatase 40 - 115 U/L 59 30(L)  Total Bilirubin 0.2 - 1.2 mg/dL 0.6 0.5   CBC Latest Ref Rng & Units 11/25/2015 10/28/2013  WBC 3.8 - 10.8 K/uL 7.5 5.8  Hemoglobin 13.2 - 17.1 g/dL 14.6 15.2  Hematocrit 38.5 - 50.0 % 44.6 44.6  Platelets 140 - 400 K/uL 227 230   Lab Results  Component Value Date   TSH 3.25 11/25/2015    Lab Results  Component Value Date   HGBA1C 6.8 (H) 11/25/2015    Lipid Panel     Component Value Date/Time   CHOL 101 (L) 11/25/2015 1343   TRIG 170 (H) 11/25/2015 1343   HDL 42 11/25/2015 1343   CHOLHDL 2.4 11/25/2015 1343   VLDL 34 (H) 11/25/2015 1343   LDLCALC 25 11/25/2015 1343     RADIOLOGY: No results found.  IMPRESSION:  1. CAD in native artery   2. Hx of CABG   3. Essential hypertension   4. Hyperlipidemia with target LDL less than 70   5. Hypertensive heart disease without heart failure   6. First degree heart block   7. Type 2 diabetes mellitus with complication, without long-term current use of insulin (HCC)     ASSESSMENT AND PLAN:  [name] is an 83 year old gentleman who is status post CABG revascularization surgery in 2001. At that time, a preoperative nuclear perfusion study was done prior to  him undergoing potential bone marrow transplantation for his brother was abnormal and led to his catheterization and ultimate detection of significant multivessel CAD. He was asymptomatic at that time without chest pain.  He has a history of hypertension and  has been demonstrated to have transient had second-degree AV block, type I.  his blood pressure today is fairly well-controlled on his current regimen consisting of Toprol-XL 25 mg daily, HCTZ 25 mg, losartan 40 mg, spironolactone 25 mg, and terazosyn 5 mg. he underwent a nuclear perfusion study in April 2018, which remained low risk and showed a very small prior basal, mid, inferoseptal region of scar without associated ischemia.  EF was 55% with normal wall motion.  I reviewed his most recent laboratory.  He has been taking an over-the-counter fish oil with only very reduced amount of EPA and DHA.  I have recommended he change his fish oil to natures bounty 1400 mg of which 980 mg his EPA and DHA.  Alternatively, we discussed the Reduce-it trial with [employer] and if his insurance will cover this would be preferable.  He is active bowling but his activity is limited by leg cramps from his spinal stenosis.  He is diabetic and on metformin.  I will see him in 6 months for reevaluation.  Time spent: 25 minutes  Troy Sine, MD, [facility]  06/24/2017 8:05 PM

## 2017-06-23 NOTE — Patient Instructions (Signed)
Medication Instructions:  INCREASE your OTC fish oil to a higher concentration of DHA  Follow-Up: Your physician wants you to follow-up in: 6 months with Dr. Claiborne Billings. You will receive a reminder letter in the mail two months in advance. If you don't receive a letter, please call our office to schedule the follow-up appointment.   Any Other Special Instructions Will Be Listed Below (If Applicable).     If you need a refill on your cardiac medications before your next appointment, please call your pharmacy.

## 2017-06-24 ENCOUNTER — Encounter: Payer: Self-pay | Admitting: Cardiovascular Disease

## 2017-09-01 ENCOUNTER — Telehealth: Payer: Self-pay | Admitting: Cardiovascular Disease

## 2017-09-01 NOTE — Telephone Encounter (Signed)
Pt says his ankles are swelling in the evening.He also said at  night his pulse rating is dropping as low as 44.Please call to advise.

## 2017-09-01 NOTE — Telephone Encounter (Signed)
Left a message to call back tomorrow

## 2017-09-02 NOTE — Telephone Encounter (Signed)
Patient called back. Patient complaining of having a low HR during the early mornings hours 4 am to 8 am, and ankle swelling. Patient stated his ankle swelling has been going on for about 3 weeks. Patient stated his ankles get worse through out the day, and the swelling goes down over night. Patient stated his low HR has been going on for about a week. Patient stated his HR has been averaging in the 50's, but is usually in the 70's. Patient stated he feels fine other than that. Patient feels that this is the early stages of heart failure, and he wants to get a head of these symptoms. Patient would like a call back on his cell phone [phone]. Will forward to Dr. Claiborne Billings for further advisement.

## 2017-09-02 NOTE — Telephone Encounter (Signed)
Lm to call back ./cy 

## 2017-09-02 NOTE — Telephone Encounter (Signed)
It is normal response for heart rate to decrease while sleeping.  As long as he is asymptomatic.  Would continue present therapy.  If he is noticing significant bradycardia during the day may need to reduce his beta blocker therapy by one half.

## 2017-09-03 NOTE — Telephone Encounter (Signed)
Left a message to call back.

## 2017-09-03 NOTE — Telephone Encounter (Signed)
Patient made aware of Dr. Evette Georges recommendations. He will call back with any further needs and if any changes concernng his heart rate and edema.

## 2017-10-25 ENCOUNTER — Other Ambulatory Visit: Payer: Self-pay | Admitting: Cardiovascular Disease

## 2017-12-10 ENCOUNTER — Other Ambulatory Visit: Payer: Self-pay | Admitting: Cardiovascular Disease

## 2017-12-10 NOTE — Telephone Encounter (Signed)
Rx request sent to pharmacy.  

## 2017-12-21 ENCOUNTER — Other Ambulatory Visit: Payer: Self-pay | Admitting: Cardiovascular Disease

## 2017-12-21 NOTE — Telephone Encounter (Signed)
Rx sent to pharmacy   

## 2018-01-24 ENCOUNTER — Ambulatory Visit: Payer: Medicare Other | Admitting: Cardiovascular Disease

## 2018-01-24 ENCOUNTER — Encounter: Payer: Self-pay | Admitting: Cardiovascular Disease

## 2018-01-24 VITALS — BP 138/72 | HR 72 | Ht 66.0 in | Wt 205.4 lb

## 2018-01-24 DIAGNOSIS — E118 Type 2 diabetes mellitus with unspecified complications: Secondary | ICD-10-CM

## 2018-01-24 DIAGNOSIS — I440 Atrioventricular block, first degree: Secondary | ICD-10-CM | POA: Diagnosis not present

## 2018-01-24 DIAGNOSIS — Z951 Presence of aortocoronary bypass graft: Secondary | ICD-10-CM | POA: Diagnosis not present

## 2018-01-24 DIAGNOSIS — I493 Ventricular premature depolarization: Secondary | ICD-10-CM

## 2018-01-24 DIAGNOSIS — E785 Hyperlipidemia, unspecified: Secondary | ICD-10-CM

## 2018-01-24 DIAGNOSIS — I2510 Atherosclerotic heart disease of native coronary artery without angina pectoris: Secondary | ICD-10-CM

## 2018-01-24 DIAGNOSIS — I10 Essential (primary) hypertension: Secondary | ICD-10-CM

## 2018-01-24 MED ORDER — METOPROLOL SUCCINATE ER 50 MG PO TB24: 50.0000 mg | ORAL_TABLET | Freq: Every day | ORAL | 3 refills | Status: DC

## 2018-01-24 NOTE — Patient Instructions (Signed)
Medication Instructions:  INCREASE metoprolol succinate (Toprol XL) to 50 mg daily ---take 37.5 mg (1.5 tablet) daily for 1-2 weeks  Labwork: PCP next month  Follow-Up: Your physician wants you to follow-up in: 6 months with Dr. Claiborne Billings. You will receive a reminder letter in the mail two months in advance. If you don't receive a letter, please call our office to schedule the follow-up appointment.   Any Other Special Instructions Will Be Listed Below (If Applicable).     If you need a refill on your cardiac medications before your next appointment, please call your pharmacy.

## 2018-01-24 NOTE — Progress Notes (Signed)
Patient ID: Peter Mullen, male   DOB: 1931-08-13, 84 y.o.   MRN: [id number]    Primary M.D.: Dr. Delfino Lovett Tisoivic  HPI: [name] is a 84 y.o. male who presents to the office today for an 8 month follow-up cardiology evaluation.    [name] has established CAD and in March 2001 underwent CABG revascularization surgery with a LIMA to the LAD, SVG to diagonal, SVG to the PLA, and SVG to the PDA vessel. Additional medical problems include hypertension, type 2 diabetes mellitus, mild obstructive sleep apnea not on CPAP therapy and obesity.   In October 2012 an nuclear perfusion study showed normal perfusion with a post stress ejection fraction of 62%. He has documented grade 1 diastolic dysfunction EA ratio 0.71.   A 2-D echo irevealed mild aortic sclerosis without stenosis, trace MR and trace TR. He has a history of mixed hyperlipidemia. He sees Dr. [name] for his primary medical care. Laboratory showed a normal chemistry profile, cholesterol 93, triglycerides, 110 HDL 31, and LDL 40 on his current medical regimen.  A two-year followup nuclear perfusion study on 06/30/2013 was low risk and demonstrated apical thinning with increased attenuation on resting images without significant ischemia. He did have mild LV dysfunction with an ejection fraction of 47% and focal apical inferior hypocontractility.    On 09/20/2013 a follow-up echo Doppler study showed an ejection fraction of 55% without diagnostic regional wall motion abnormalities although septal bounce was noted. He had grade 1 diastolic dysfunction. He had moderate calcification of his aortic valve without stenosis and there was moderate calcification of the mitral annulus with mildly calcified leaflets without significant regurgitation. His left atrium was mildly dilated. He had normal pulmonary pressures.  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  At that time, he had been on Toprol 100 mg daily and I  reduced this to 50 mg per day.  He has continued to take losartan 100 mg, HCTZ in addition to terazocin for hypertension.   In 2017 he had broken 2 of his molars and has been evaluated at the dental clinic at [facility].  He has been on chronic Plavix.  He had noticed that his blood pressure has been consistently elevated despite taking his vacations which include HCTZ 25 mg, losartan 100 mg, Toprol-XL 25 mg, and Hytrin 5 mg daily.  I added amlodipine 5 mg to his medical regimen for improved blood pressure control.  He had held his Plavix for dental work on his molars.   He is remaining active and typically bowls 2 times per week.  He has been having occasional pain down his left leg which may be attributable to spinal stenosis and in the past he had seen Dr. [name] for this, which had limited his walking.  He will need to have a molar extraction as well as an impacted was some tooth removed at the [facility], which is scheduled for later this month.  He underwent a nuclear perfusion study on 11/03/2016 in follow-up of his remote CABG surgery and this continued to be low risk and showed only a very small prior basal/mid inferoseptal defect without associated ischemia.  Ejection fraction was 55% and he had normal wall motion.    He underwent a nuclear perfusion study in April 2018, which remained low risk and showed a small defect in the basal inferoseptal and mid inferoseptal region consistent with probable scar without associated ischemia.  EF was 55% with normal wall motion.  Laboratory in June 2018 which showed total cholesterol 122, HDL 38, LDL 39, but triglycerides were elevated at 224.  Hemoglobin A1c was 6.2.  TSH was 3.25.     Since I last saw him in November 2018, he has remained stable and has been without chest pain.  He does admit to some exertional fatigue.  He was recently found to have an elevated uric acid level and was just started on allopurinol.  He has been evaluated by Dr. [name] for  left leg discomfort with some left ankle swelling.  He denies any increasing shortness of breath or significant palpitations.  He will be vacationing up in [place] next week.  He will be undergoing laboratory by his primary physician next month.  He presents for evaluation.  He presents for reevaluation.   Past medical history: CAD, status post CABG, hypertension, type 2 diabetes mellitus, mixed hyperlipidemia, obstructive sleep apnea, mild. He is status post removal of a a basal cell cancer removed from his left cheek by Dr. Sarajane Jews.    Past surgical history notable for CABG surgery, cataract surgery 2005 prostate surgery 1989.  Current Outpatient Medications  Medication Sig Dispense Refill  . Ascorbic Acid (VITAMIN C) 1000 MG tablet Take 1,000 mg by mouth as needed.     [name] aspirin 81 MG tablet Take 81 mg by mouth daily.    [name] BIOTIN 5000 PO Take by mouth.    . cetirizine (ZYRTEC) 10 MG tablet Take 10 mg by mouth daily.    . Choline Fenofibrate (TRILIPIX) 135 MG capsule Take 135 mg by mouth daily.    . clopidogrel (PLAVIX) 75 MG tablet Take 75 mg by mouth daily.    . Cyanocobalamin (VITAMIN B 12 PO) Take 2,500 mcg by mouth.    . diphenhydrAMINE (BENADRYL) 25 mg capsule Take 25 mg by mouth as needed.    . diphenhydramine-acetaminophen (TYLENOL PM) 25-500 MG TABS tablet Take 1 tablet by mouth at bedtime as needed.    . diphenoxylate-atropine (LOMOTIL) 2.5-0.025 MG per tablet Take 1 tablet by mouth 4 (four) times daily as needed for diarrhea or loose stools. 30 tablet 0  . fluticasone (FLONASE) 50 MCG/ACT nasal spray Place 1 spray into both nostrils daily.    . folic acid (FOLVITE) 962 MCG tablet Take 400 mcg by mouth daily.    . hydrochlorothiazide (HYDRODIURIL) 25 MG tablet Take 25 mg by mouth daily.    [name] LIPITOR 40 MG tablet TAKE 1/2 (ONE-HALF) TABLET BY MOUTH ONCE DAILY 45 tablet 3  . metFORMIN (GLUCOPHAGE) 500 MG tablet Take 500 mg by mouth 2 (two) times daily with a meal.     . metoprolol  succinate (TOPROL-XL) 50 MG 24 hr tablet Take 1 tablet (50 mg total) by mouth daily. 90 tablet 3  . montelukast (SINGULAIR) 10 MG tablet Take 1 tablet by mouth at bedtime.    [name] olmesartan (BENICAR) 40 MG tablet TAKE 1 TABLET BY MOUTH ONCE DAILY 90 tablet 3  . Omega-3 Fatty Acids (FISH OIL PO) Take by mouth.    [name] omeprazole (PRILOSEC) 20 MG capsule Take 20 mg by mouth 2 (two) times daily before a meal.    . spironolactone (ALDACTONE) 25 MG tablet TAKE 1 TABLET BY MOUTH ONCE DAILY AFTER BREAKFAST 30 tablet 2  . terazosin (HYTRIN) 5 MG capsule Take 1 capsule by mouth at bedtime.    [name] testosterone (ANDROGEL) 50 MG/5GM GEL Place 5 g onto the skin daily.    . vitamin E 400  UNIT capsule Take 400 Units by mouth daily.     No current facility-administered medications for this visit.     He is widowed per history children 4 grandchildren. There is no tobacco use. He does drink occasional alcohol.  ROS General: Negative; No fevers, chills, or night sweats;  HEENT: Positive for cracks in his molars; No changes in vision or hearing, sinus congestion, difficulty swallowing Pulmonary: Negative; No cough, wheezing, shortness of breath, hemoptysis Cardiovascular: Negative; No chest pain, presyncope, syncope, palpitations GI: Negative; No nausea, vomiting, diarrhea, or abdominal pain GU: Negative; No dysuria, hematuria, or difficulty voiding Musculoskeletal: Positive for back pain with leg discomfort secondary to spinal stenosis; intermittent left leg discomfort being evaluated by Dr. Lindwood Qua Hematologic/Oncology: Negative; no easy bruising, bleeding Endocrine: Negative; no heat/cold intolerance; no diabetes Neuro: Negative; no changes in balance, headaches Skin: history of removal of prior basal cell carcinoma with clear margins; No rashes or skin lesions Psychiatric: Negative; No behavioral problems, depression Sleep: mild sleep apnea, not on CPAP therapy; No  daytime sleepiness, hypersomnolence,  bruxism, restless legs, hypnogognic hallucinations, no cataplexy Other comprehensive 14 point system review is negative.   PE BP 138/72   Pulse 72   Ht '5\' 6"'  (1.676 m)   Wt 205 lb 6.4 oz (93.2 kg)   BMI 33.15 kg/m    Repeat blood pressure by me was 148/74 supine and 146/74 standing.  Wt Readings from Last 3 Encounters:  01/24/18 205 lb 6.4 oz (93.2 kg)  06/23/17 209 lb (94.8 kg)  11/06/16 194 lb 9.6 oz (88.3 kg)   General: Alert, oriented, no distress.  Skin: normal turgor, no rashes, warm and dry HEENT: Normocephalic, atraumatic. Pupils equal round and reactive to light; sclera anicteric; extraocular muscles intact;  Nose without nasal septal hypertrophy Mouth/Parynx benign; Mallinpatti scale 3 Neck: No JVD, no carotid bruits; normal carotid upstroke Lungs: clear to ausculatation and percussion; no wheezing or rales Chest wall: without tenderness to palpitation Heart: PMI not displaced, RRR, s1 s2 normal, 1/6 systolic murmur, no diastolic murmur, no rubs, gallops, thrills, or heaves Abdomen: soft, nontender; no hepatosplenomehaly, BS+; abdominal aorta nontender and not dilated by palpation. Back: no CVA tenderness Pulses 2+ Musculoskeletal: full range of motion, normal strength, no joint deformities Extremities: no clubbing cyanosis or edema, no palpable cord.  Homan's sign negative  Neurologic: grossly nonfocal; Cranial nerves grossly wnl Psychologic: Normal mood and affect   ECG (independently read by me): Sinus rhythm with first-degree AV block, frequent PVCs with transient bigeminy.  QTc interval 451 ms.  PR interval 242 ms.  November 2018 ECG (independently read by me): Difficult to discern but probable sinus rhythm with PACs  October 2017 ECG (independently read by me): Normal sinus rhythm with first-degree AV block.  PAC.  Left bundle branch block.  July 2017 ECG (independently read by me): Sinus rhythm at 64 bpm.  PACs, nonspecific interventricular block.   First-degree AV block.  April 2017 ECG (independently read by me): Normal sinus rhythm at 64 bpm with occasional PVC and sinus arrhythmia.  First-degree AV block with a PR interval at 278 ms.  Blocked APC.  January 2017 ECG (independently read by me):  Sinus rhythm marked first-degree block.there also was a blocked APC.   He was a suggestion of possible 2nd degree Wenkebach block.  July 2016 ECG (independently read by me): Sinus rhythm with first-degree AV block with a PR interval at 248 ms.  May 2016 ECG (independently read by me): Probable 2nd degree  Wenkebach block  ECG (independently read by me): normal sinus rhythm with first-degree AV block with a PR interval at 230 milliseconds.  Prior March 2015ECG: (independently read by me) sinus rhythm at 73 beats per minute. First degree block with PR interval 248 ms. Isolated unifocal PVCs.   LABS:  BMP Latest Ref Rng & Units 02/24/2016 11/25/2015 10/28/2013  Glucose 65 - 99 mg/dL 139(H) 117(H) 135(H)  BUN 7 - 25 mg/dL '14 20 20  ' Creatinine 0.70 - 1.11 mg/dL 1.14(H) 1.11 1.43(H)  Sodium 135 - 146 mmol/L 141 139 135(L)  Potassium 3.5 - 5.3 mmol/L 3.9 4.5 3.9  Chloride 98 - 110 mmol/L 104 99 101  CO2 20 - 31 mmol/L '23 27 20  ' Calcium 8.6 - 10.3 mg/dL 9.0 9.7 8.8   Hepatic Function Latest Ref Rng & Units 11/25/2015 10/28/2013  Total Protein 6.1 - 8.1 g/dL 6.6 7.5  Albumin 3.6 - 5.1 g/dL 4.2 3.9  AST 10 - 35 U/L 29 35  ALT 9 - 46 U/L 26 26  Alk Phosphatase 40 - 115 U/L 59 30(L)  Total Bilirubin 0.2 - 1.2 mg/dL 0.6 0.5   CBC Latest Ref Rng & Units 11/25/2015 10/28/2013  WBC 3.8 - 10.8 K/uL 7.5 5.8  Hemoglobin 13.2 - 17.1 g/dL 14.6 15.2  Hematocrit 38.5 - 50.0 % 44.6 44.6  Platelets 140 - 400 K/uL 227 230   Lab Results  Component Value Date   TSH 3.25 11/25/2015    Lab Results  Component Value Date   HGBA1C 6.8 (H) 11/25/2015    Lipid Panel     Component Value Date/Time   CHOL 101 (L) 11/25/2015 1343   TRIG 170 (H) 11/25/2015 1343   HDL  42 11/25/2015 1343   CHOLHDL 2.4 11/25/2015 1343   VLDL 34 (H) 11/25/2015 1343   LDLCALC 25 11/25/2015 1343     RADIOLOGY: No results found.  IMPRESSION:  1. CAD in native artery   2. Hx of CABG   3. Essential hypertension   4. Hyperlipidemia with target LDL less than 70   5. First degree heart block   6. Asymptomatic PVCs   7. Type 2 diabetes mellitus with complication, without long-term current use of insulin (HCC)     ASSESSMENT AND PLAN:  [name] is an 84 year old gentleman who is status post CABG revascularization surgery in 2001. At that time, a preoperative nuclear perfusion study was done prior to him undergoing potential bone marrow transplantation for his brother was abnormal and led to his catheterization and ultimate detection of significant multivessel CAD. He was asymptomatic without chest pain.  He has a history of hypertension and  has been demonstrated to have transient had second-degree AV block, type I.  Blood ressure today is elevated and on repeat by me was 148/74.  He has been maintained on Toprol-XL 25 mg, HCTZ 25 mg, Hytrin 5 mg, olmesartan 40 mg, and Spironolactone 25 mg.  His ECG today shows on his rhythm with frequent PVCs and transient bigeminy.  With his blood pressure elevation and ectopy, I have recommended further titration of Toprol-XL from 25 mg up to 37.5 mg daily.  This may need to be further increased to 50 mg in the future if depending on his response.  He notes intermittent legs ankle swelling and is on HCTZ and spironolactone.  He was recently diagnosed with uric acid.  It may be worthwhile if swelling persist to discontinue HCTZ and change to furosemide.  He continues to be on  dual antiplatelet therapy with aspirin and Plavix and denies bleeding.  Target LDL is less than 70 for hyperlipidemia with his CAD and he continues to be on atorvastatin 40 mg daily in addition to try lipids 135 mg.  When I last saw him I suggested the addition of the Vascepa  in light of his elevated triglycerides of 224 and discussed with him the "reduce it" trial data.  LDL in June 2018 was excellent at 39.  He is diabetic and continues to take metformin.  He will be seeing Dr. Osborne Casco next month and laboratory will be obtained.  I will see him in 6 months for reevaluation. .  Time spent: 25 minutes  Troy Sine, MD, [facility]  01/25/2018 3:04 PM

## 2018-01-25 ENCOUNTER — Encounter: Payer: Self-pay | Admitting: Cardiovascular Disease

## 2018-02-01 NOTE — Addendum Note (Signed)
Addended by: Patria Mane A on: 02/01/2018 02:44 PM   Modules accepted: Orders

## 2018-04-22 ENCOUNTER — Telehealth: Payer: Self-pay | Admitting: Cardiovascular Disease

## 2018-04-22 NOTE — Telephone Encounter (Signed)
Spoke with pt who states he went to the mall today and walked around then started experiencing bilateral leg pain with left being more severe. He states he then noticed both ankle swollen. He reports pain was relieved with rest but ankles are still swollen. He denies any SOB or other areas of swelling. Appointment scheduled for 04/26/18 at 1130 with Rosaria Ferries, PA.

## 2018-04-22 NOTE — Telephone Encounter (Signed)
New Message   Pt c/o swelling: STAT is pt has developed SOB within 24 hours  1) How much weight have you gained and in what time span? unknown 2) If swelling, where is the swelling located? ankle  3) Are you currently taking a fluid pill? yes  4) Are you currently SOB? No   5) Do you have a log of your daily weights (if so, list)? Patient does not weigh daily   6) Have you gained 3 pounds in a day or 5 pounds in a week? unknown  7) Have you traveled recently? No     Patient does not weigh himself daily but he says on 09/01 he weighed  200lbs and today  09/27 he weighed 204.8lbs. He says he has experience some leg pains as well.

## 2018-04-26 ENCOUNTER — Telehealth: Payer: Self-pay

## 2018-04-26 ENCOUNTER — Encounter: Payer: Self-pay | Admitting: Physician Assistant

## 2018-04-26 ENCOUNTER — Ambulatory Visit: Payer: Medicare Other | Admitting: Physician Assistant

## 2018-04-26 VITALS — BP 138/76 | HR 90 | Ht 66.0 in | Wt 206.8 lb

## 2018-04-26 DIAGNOSIS — M79661 Pain in right lower leg: Secondary | ICD-10-CM

## 2018-04-26 DIAGNOSIS — I2510 Atherosclerotic heart disease of native coronary artery without angina pectoris: Secondary | ICD-10-CM

## 2018-04-26 DIAGNOSIS — I10 Essential (primary) hypertension: Secondary | ICD-10-CM

## 2018-04-26 DIAGNOSIS — M79662 Pain in left lower leg: Secondary | ICD-10-CM

## 2018-04-26 DIAGNOSIS — I441 Atrioventricular block, second degree: Secondary | ICD-10-CM

## 2018-04-26 NOTE — Progress Notes (Addendum)
Cardiology Office Note   Date:  04/26/2018   ID:  [name], DOB 04-Dec-1931, MRN [id number]  PCP:  Haywood Pao, MD  Cardiologist:  Shelva Majestic, MD Peter Ferries, PA-C   Chief Complaint  Patient presents with  . Leg Pain    History of Present Illness: Peter Mullen is a 84 y.o. male with a history of CABG 2001 w/ LIMA-LAD, SVG-Diag, SVG-PLA, SVG-PDA, DM, HTN, OSA not on CPAP, obesity, Ao sclerosis, grade 1 dd on echo, nl MV 2012 w/ EF 62%   Peter Mullen presents for cardiology follow up.  He has been having pain in his legs for over a year. Dr [name] thinks it might be spinal stenosis.  [name] He also has gout.  That is most visible in his left second toe.  He has been having pain in his legs when walking, worse recently. The pain starts in the top of his foot, then goes up the front and back of his shins. It will go up the back of his leg and the backs of his thighs. It will then go into his buttocks and into his back.   This past Friday, he walked about 100 yards and got 9/10 leg pain. He rested for about 15 minutes and it got down to a 2/10. It got worse again when he started walking again.  This is the worst episode of pain he has had.  Yesterday am, he weighed 207.8 lbs on his home scales. This am, was down to 203.6. He had frequent urination all night. He has daytime edema, but does not wake with it.  It generally goes down at night.  He denies orthopnea or PND.  Yesterday, he had a molar pulled. Still has a little bleeding. Restarted Plavix yesterday, restarts ASA today.   He gets occasional chest discomfort, this is relieved by coughing a couple of times and clearing his throat.  He is not having consistent exertional chest pain.   Past Medical History:  Diagnosis Date  . Coronary artery disease   . Diabetes mellitus without complication ([place])   . Hyperlipidemia   . Hypertension   . Skin cancer   . Stomach problems     Past Surgical  History:  Procedure Laterality Date  . CORONARY ARTERY BYPASS GRAFT  2001   x4 LIMA-LAD, SVG-PDA  . [place]  . TONSILLECTOMY  1940  . TUMOR REMOVAL  1970   fatty tissue tumor  . VASECTOMY  1975    Current Outpatient Medications  Medication Sig Dispense Refill  . allopurinol (ZYLOPRIM) 300 MG tablet Take 300 mg by mouth daily.  5  . Ascorbic Acid (VITAMIN C) 1000 MG tablet Take 1,000 mg by mouth as needed.     [name] aspirin 81 MG tablet Take 81 mg by mouth daily.    [name] BIOTIN 5000 PO Take by mouth.    . cetirizine (ZYRTEC) 10 MG tablet Take 10 mg by mouth daily.    . Choline Fenofibrate (TRILIPIX) 135 MG capsule Take 135 mg by mouth daily.    . clopidogrel (PLAVIX) 75 MG tablet Take 75 mg by mouth daily.    . colchicine 0.6 MG tablet Take 1 tablet by mouth as directed.  1  . Cyanocobalamin (VITAMIN B 12 PO) Take 2,500 mcg by mouth.    . diphenhydrAMINE (BENADRYL) 25 mg capsule Take 25 mg by mouth as needed.    . diphenhydramine-acetaminophen (TYLENOL PM) 25-500 MG TABS  tablet Take 1 tablet by mouth at bedtime as needed.    . diphenoxylate-atropine (LOMOTIL) 2.5-0.025 MG per tablet Take 1 tablet by mouth 4 (four) times daily as needed for diarrhea or loose stools. 30 tablet 0  . fluticasone (FLONASE) 50 MCG/ACT nasal spray Place 1 spray into both nostrils daily.    . folic acid (FOLVITE) 619 MCG tablet Take 400 mcg by mouth daily.    . hydrochlorothiazide (HYDRODIURIL) 25 MG tablet Take 25 mg by mouth daily.    [name] LIPITOR 40 MG tablet TAKE 1/2 (ONE-HALF) TABLET BY MOUTH ONCE DAILY 45 tablet 3  . metFORMIN (GLUCOPHAGE) 500 MG tablet Take 500 mg by mouth 2 (two) times daily with a meal.     . metoprolol succinate (TOPROL-XL) 50 MG 24 hr tablet Take 1 tablet (50 mg total) by mouth daily. 90 tablet 3  . montelukast (SINGULAIR) 10 MG tablet Take 1 tablet by mouth at bedtime.    [name] olmesartan (BENICAR) 40 MG tablet TAKE 1 TABLET BY MOUTH ONCE DAILY 90 tablet 3  . Omega-3 Fatty Acids  (FISH OIL PO) Take by mouth.    [name] omeprazole (PRILOSEC) 20 MG capsule Take 20 mg by mouth 2 (two) times daily before a meal.    . spironolactone (ALDACTONE) 25 MG tablet TAKE 1 TABLET BY MOUTH ONCE DAILY AFTER BREAKFAST 30 tablet 2  . terazosin (HYTRIN) 5 MG capsule Take 1 capsule by mouth at bedtime.    [name] testosterone (ANDROGEL) 50 MG/5GM GEL Place 5 g onto the skin daily.    . vitamin E 400 UNIT capsule Take 400 Units by mouth daily.     No current facility-administered medications for this visit.     Allergies:   Codeine and Nsaids    Social History:  The patient  reports that he has never smoked. He has never used smokeless tobacco. He reports that he drinks about 2.0 standard drinks of alcohol per week.   Family History:  The patient's family history includes Diabetes in his mother; Heart disease in his maternal grandfather and mother; Leukemia in his brother; Lung cancer in his father; Stroke in his mother and paternal grandfather.    ROS:  Please see the history of present illness. All other systems are reviewed and negative.    PHYSICAL EXAM: VS:  BP 138/76   Pulse 90   Ht 5\' 6"  (1.676 m)   Wt 206 lb 12.8 oz (93.8 kg)   SpO2 94%   BMI 33.38 kg/m  , BMI Body mass index is 33.38 kg/m. GEN: Well nourished, well developed, male in no acute distress  HEENT: normal for age  Neck: no JVD, no carotid bruit, no masses Cardiac: RRR; no murmur, no rubs, or gallops Respiratory:  clear to auscultation bilaterally, normal work of breathing GI: soft, nontender, nondistended, + BS MS: no deformity or atrophy; no edema; distal pulses are 2+ in all 4 extremities, no femoral bruits appreciated, capillary refill is within normal limits Skin: warm and dry, no rash Neuro:  Strength and sensation are intact Psych: euthymic mood, full affect   EKG:  EKG is ordered today. The ECG ordered today demonstrates second-degree AV block Mobitz 1 with a heart rate of 66.  On his previous ECG,  01/24/2018, there was a great deal of artifact and frequent PVCs, making rhythm determination more problematic.  However, he did have some prolonged PR intervals   Recent Labs: No results found for requested labs within last 8760 hours.  Lipid Panel    Component Value Date/Time   CHOL 101 (L) 11/25/2015 1343   TRIG 170 (H) 11/25/2015 1343   HDL 42 11/25/2015 1343   CHOLHDL 2.4 11/25/2015 1343   VLDL 34 (H) 11/25/2015 1343   LDLCALC 25 11/25/2015 1343     Wt Readings from Last 3 Encounters:  04/26/18 206 lb 12.8 oz (93.8 kg)  01/24/18 205 lb 6.4 oz (93.2 kg)  06/23/17 209 lb (94.8 kg)     Other studies Reviewed: Additional studies/ records that were reviewed today include: Office notes, hospital records and testing.  ASSESSMENT AND PLAN:  1.  Lower extremity pain, possible claudication: He is at extremely high risk to have claudication because of his other cardiovascular issues.  However, there are no signs or symptoms concerning for severe PAD. - Check ABIs and follow-up on the results.  If they are greatly decreased, refer to PV -Otherwise, follow-up with Dr. [name].  2.  CAD: He is not having any clearly ischemic symptoms.  Continue aspirin, statin, Plavix, beta-blocker and ARB.  After he left, noticed that he did not have a prescription for sublingual nitroglycerin.  As his bypass surgery was 18 years ago, we will call and offer him one.  3.  Hypertension: Blood pressure is on the high side of normal, but he states he had his medications not long ago and they may not have kicked in yet fully.  4.  Arrhythmia: His ECG appears to be Mobitz 1.  This was confirmed by Dr. Oval Linsey.  He is asymptomatic with this and his baseline heart rate is normal, continue beta-blocker.   Current medicines are reviewed at length with the patient today.  The patient does not have concerns regarding medicines.  The following changes have been made: Offer him nitroglycerin  Labs/ tests  ordered today include:   ABI's   Disposition:   FU with Shelva Majestic, MD   Signed, Peter Ferries, PA-C  04/26/2018 3:40 PM    [facility] Phone: [phone]; Fax: [phone]  This note was written with the assistance of speech recognition software. Please excuse any transcriptional errors.

## 2018-04-26 NOTE — Telephone Encounter (Signed)
Pt was seen by Rosaria Ferries, PA today. The pt does not have Nitro-glycerin listed on his med list. Suanne Marker would like the pt to have a prescription for Nitro if he thinks she needs it.  lmtcb.

## 2018-04-26 NOTE — Patient Instructions (Signed)
Schedule ABI's both legs    Your physician recommends that you schedule a follow-up appointment with [facility]

## 2018-04-27 NOTE — Addendum Note (Signed)
Addended by: Kathyrn Lass on: 04/27/2018 03:57 PM   Modules accepted: Orders

## 2018-04-28 ENCOUNTER — Other Ambulatory Visit: Payer: Self-pay | Admitting: Physician Assistant

## 2018-04-28 DIAGNOSIS — M79662 Pain in left lower leg: Secondary | ICD-10-CM

## 2018-04-28 DIAGNOSIS — I739 Peripheral vascular disease, unspecified: Secondary | ICD-10-CM

## 2018-04-28 DIAGNOSIS — I2510 Atherosclerotic heart disease of native coronary artery without angina pectoris: Secondary | ICD-10-CM

## 2018-04-28 DIAGNOSIS — M79661 Pain in right lower leg: Secondary | ICD-10-CM

## 2018-05-02 MED ORDER — NITROGLYCERIN 0.4 MG SL SUBL: 0.4000 mg | SUBLINGUAL_TABLET | SUBLINGUAL | 11 refills | Status: DC | PRN

## 2018-05-02 NOTE — Telephone Encounter (Signed)
Contacted patient to discuss adding nitroglycerin as needed for chest pain to medication regimen per Rosaria Ferries, PA. Instructed patient how to take this medication. Patient agreeable and verbalized understanding. Prescription has been sent to [facility] as requested by patient. No further questions.

## 2018-05-02 NOTE — Telephone Encounter (Signed)
°  Patient returning call to discuss Nitro. Please call [phone]

## 2018-05-04 ENCOUNTER — Ambulatory Visit (HOSPITAL_COMMUNITY)
Admission: RE | Admit: 2018-05-04 | Discharge: 2018-05-04 | Disposition: A | Discharge status: Home / Self Care | Payer: Medicare Other | Source: Ambulatory Visit | Attending: Internal Medicine | Admitting: Internal Medicine

## 2018-05-04 DIAGNOSIS — M79661 Pain in right lower leg: Secondary | ICD-10-CM | POA: Diagnosis not present

## 2018-05-04 DIAGNOSIS — M79662 Pain in left lower leg: Secondary | ICD-10-CM | POA: Diagnosis present

## 2018-05-04 DIAGNOSIS — I739 Peripheral vascular disease, unspecified: Secondary | ICD-10-CM | POA: Diagnosis present

## 2018-05-04 DIAGNOSIS — I2510 Atherosclerotic heart disease of native coronary artery without angina pectoris: Secondary | ICD-10-CM | POA: Insufficient documentation

## 2018-05-09 ENCOUNTER — Telehealth: Payer: Self-pay

## 2018-05-09 NOTE — Telephone Encounter (Signed)
Called patient, gave results. Patient verbalized understanding, no questions at this time.

## 2018-05-09 NOTE — Telephone Encounter (Signed)
-----   Message from Lonn Georgia, PA-C sent at 05/09/2018 11:17 AM EDT ----- Please let him know that the circulation in his legs is good. His pain may be neuropathy or something else, no indication that it is related to PAD. Thanks

## 2018-05-26 ENCOUNTER — Observation Stay (HOSPITAL_COMMUNITY)
Admission: EM | Admit: 2018-05-26 | Discharge: 2018-05-27 | Disposition: A | Discharge status: Home / Self Care | Payer: Medicare Other | Attending: Family Medicine | Admitting: Family Medicine

## 2018-05-26 ENCOUNTER — Emergency Department (HOSPITAL_COMMUNITY): Payer: Medicare Other

## 2018-05-26 ENCOUNTER — Encounter (HOSPITAL_COMMUNITY): Payer: Self-pay

## 2018-05-26 DIAGNOSIS — Z79899 Other long term (current) drug therapy: Secondary | ICD-10-CM | POA: Insufficient documentation

## 2018-05-26 DIAGNOSIS — Z7902 Long term (current) use of antithrombotics/antiplatelets: Secondary | ICD-10-CM | POA: Diagnosis not present

## 2018-05-26 DIAGNOSIS — Z951 Presence of aortocoronary bypass graft: Secondary | ICD-10-CM | POA: Diagnosis not present

## 2018-05-26 DIAGNOSIS — I2510 Atherosclerotic heart disease of native coronary artery without angina pectoris: Secondary | ICD-10-CM | POA: Diagnosis not present

## 2018-05-26 DIAGNOSIS — Z886 Allergy status to analgesic agent status: Secondary | ICD-10-CM | POA: Diagnosis not present

## 2018-05-26 DIAGNOSIS — Z885 Allergy status to narcotic agent status: Secondary | ICD-10-CM | POA: Diagnosis not present

## 2018-05-26 DIAGNOSIS — Y9301 Activity, walking, marching and hiking: Secondary | ICD-10-CM | POA: Diagnosis not present

## 2018-05-26 DIAGNOSIS — Y9239 Other specified sports and athletic area as the place of occurrence of the external cause: Secondary | ICD-10-CM | POA: Diagnosis not present

## 2018-05-26 DIAGNOSIS — Z85828 Personal history of other malignant neoplasm of skin: Secondary | ICD-10-CM | POA: Diagnosis not present

## 2018-05-26 DIAGNOSIS — W010XXA Fall on same level from slipping, tripping and stumbling without subsequent striking against object, initial encounter: Secondary | ICD-10-CM | POA: Diagnosis not present

## 2018-05-26 DIAGNOSIS — Y9354 Activity, bowling: Secondary | ICD-10-CM | POA: Insufficient documentation

## 2018-05-26 DIAGNOSIS — I441 Atrioventricular block, second degree: Secondary | ICD-10-CM | POA: Diagnosis present

## 2018-05-26 DIAGNOSIS — W19XXXA Unspecified fall, initial encounter: Secondary | ICD-10-CM

## 2018-05-26 DIAGNOSIS — I7300 Raynaud's syndrome without gangrene: Secondary | ICD-10-CM | POA: Insufficient documentation

## 2018-05-26 DIAGNOSIS — M109 Gout, unspecified: Secondary | ICD-10-CM | POA: Insufficient documentation

## 2018-05-26 DIAGNOSIS — E785 Hyperlipidemia, unspecified: Secondary | ICD-10-CM | POA: Insufficient documentation

## 2018-05-26 DIAGNOSIS — I119 Hypertensive heart disease without heart failure: Secondary | ICD-10-CM | POA: Insufficient documentation

## 2018-05-26 DIAGNOSIS — Z7982 Long term (current) use of aspirin: Secondary | ICD-10-CM | POA: Diagnosis not present

## 2018-05-26 DIAGNOSIS — S065XAA Traumatic subdural hemorrhage with loss of consciousness status unknown, initial encounter: Secondary | ICD-10-CM | POA: Diagnosis present

## 2018-05-26 DIAGNOSIS — S065X9A Traumatic subdural hemorrhage with loss of consciousness of unspecified duration, initial encounter: Secondary | ICD-10-CM | POA: Diagnosis present

## 2018-05-26 DIAGNOSIS — S0003XA Contusion of scalp, initial encounter: Secondary | ICD-10-CM | POA: Diagnosis present

## 2018-05-26 DIAGNOSIS — Z7984 Long term (current) use of oral hypoglycemic drugs: Secondary | ICD-10-CM | POA: Insufficient documentation

## 2018-05-26 DIAGNOSIS — E119 Type 2 diabetes mellitus without complications: Secondary | ICD-10-CM

## 2018-05-26 DIAGNOSIS — I10 Essential (primary) hypertension: Secondary | ICD-10-CM | POA: Diagnosis present

## 2018-05-26 LAB — BASIC METABOLIC PANEL
ANION GAP: 13 (ref 5–15)
BUN: 15 mg/dL (ref 8–23)
CO2: 23 mmol/L (ref 22–32)
Calcium: 9.1 mg/dL (ref 8.9–10.3)
Chloride: 103 mmol/L (ref 98–111)
Creatinine, Ser: 1.27 mg/dL — ABNORMAL HIGH (ref 0.61–1.24)
GFR calc Af Amer: 58 mL/min — ABNORMAL LOW (ref >60)
GFR, EST NON AFRICAN AMERICAN: 50 mL/min — AB (ref >60)
Glucose, Bld: 228 mg/dL — ABNORMAL HIGH (ref 70–99)
POTASSIUM: 3.7 mmol/L (ref 3.5–5.1)
SODIUM: 139 mmol/L (ref 135–145)

## 2018-05-26 LAB — CBC WITH DIFFERENTIAL/PLATELET
Abs Immature Granulocytes: 0.04 10*3/uL (ref 0.00–0.07)
BASOS ABS: 0 10*3/uL (ref 0.0–0.1)
Basophils Relative: 1 %
EOS PCT: 3 %
Eosinophils Absolute: 0.2 10*3/uL (ref 0.0–0.5)
HEMATOCRIT: 44.3 % (ref 39.0–52.0)
Hemoglobin: 13.9 g/dL (ref 13.0–17.0)
Immature Granulocytes: 1 %
LYMPHS ABS: 2.5 10*3/uL (ref 0.7–4.0)
Lymphocytes Relative: 35 %
MCH: 29.9 pg (ref 26.0–34.0)
MCHC: 31.4 g/dL (ref 30.0–36.0)
MCV: 95.3 fL (ref 80.0–100.0)
Monocytes Absolute: 0.6 10*3/uL (ref 0.1–1.0)
Monocytes Relative: 8 %
NRBC: 0 % (ref 0.0–0.2)
Neutro Abs: 3.8 10*3/uL (ref 1.7–7.7)
Neutrophils Relative %: 52 %
Platelets: 212 10*3/uL (ref 150–400)
RBC: 4.65 MIL/uL (ref 4.22–5.81)
RDW: 14.3 % (ref 11.5–15.5)
WBC: 7.2 10*3/uL (ref 4.0–10.5)

## 2018-05-26 LAB — I-STAT TROPONIN, ED: TROPONIN I, POC: 0 ng/mL (ref 0.00–0.08)

## 2018-05-26 IMAGING — CT CT CERVICAL SPINE W/O CM
5 of 8 series · 11 of 33 positions shown, 12 images · non-contrast
Comparison: [DATE].

CLINICAL DATA: [REDACTED] reports pt was bowling, stumbled to right and
fell hitting head right posterior Pt is unable to remember the
event. [DATE] pain in head

EXAM:
CT HEAD WITHOUT CONTRAST
CT CERVICAL SPINE WITHOUT CONTRAST
TECHNIQUE: Multidetector CT imaging of the head and cervical spine was
performed following the standard protocol without intravenous
contrast. Multiplanar CT image reconstructions of the cervical spine
were also generated.

[Series 5: head 2.0 h70h · axial · 0.46mm/px · z∈[+5,+59]mm · 2 of 83 slices shown]
[im 28/83  bone]
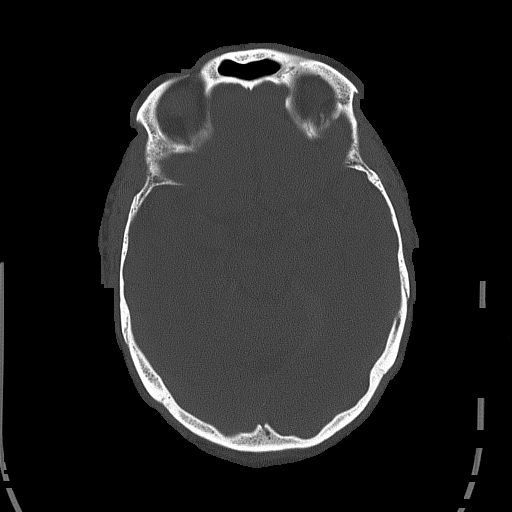
[im 55/83  bone]
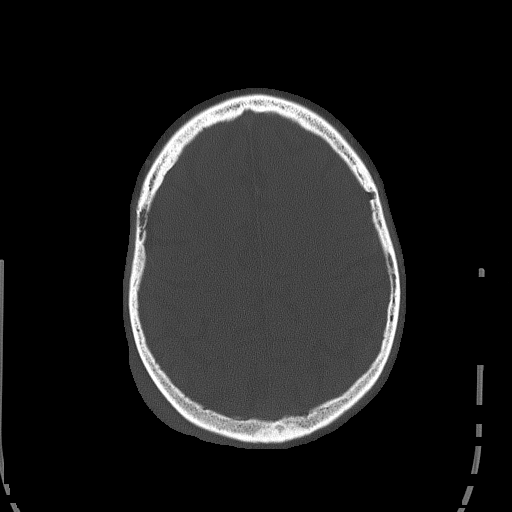

[Series 9: c_spine 2.0 st · axial · 0.30mm/px · z∈[-159,-99]mm · 2 of 91 slices shown, 3 images]
[im 31/91  soft-tissue]
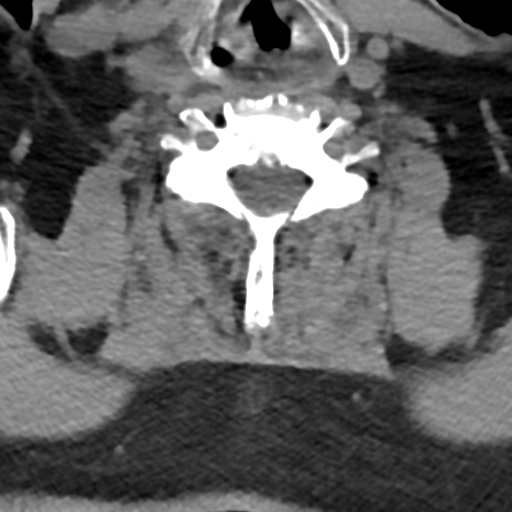
[im 31/91  bone]
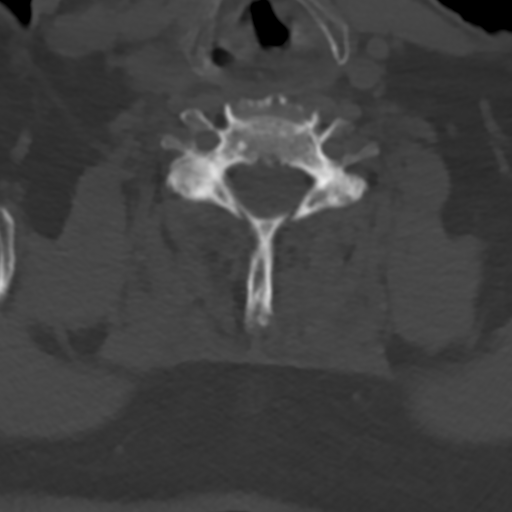
[im 61/91  bone]
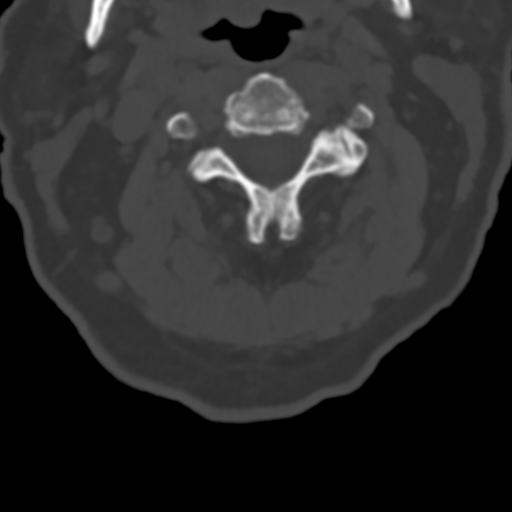

[Series 12: coronal bone · coronal · 0.23mm/px · 1 of 69 slices shown]
[im 35/69  bone]
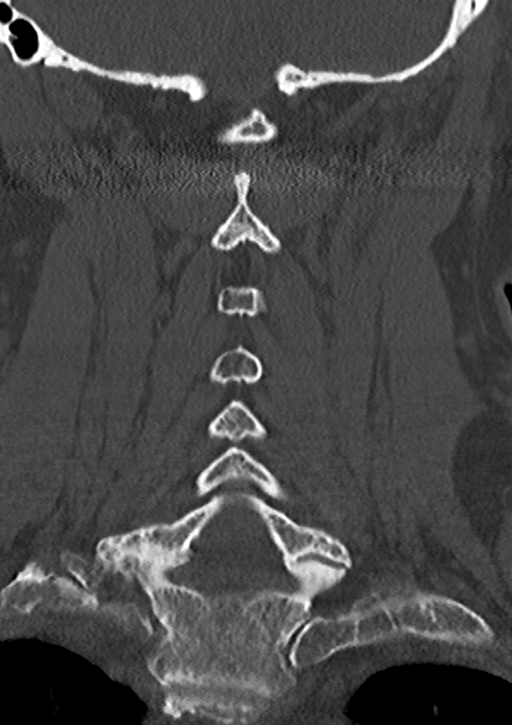

[Series 13: sagittal bone · sagittal · 0.27mm/px · 4 of 61 slices shown]
[im 13/61  bone]
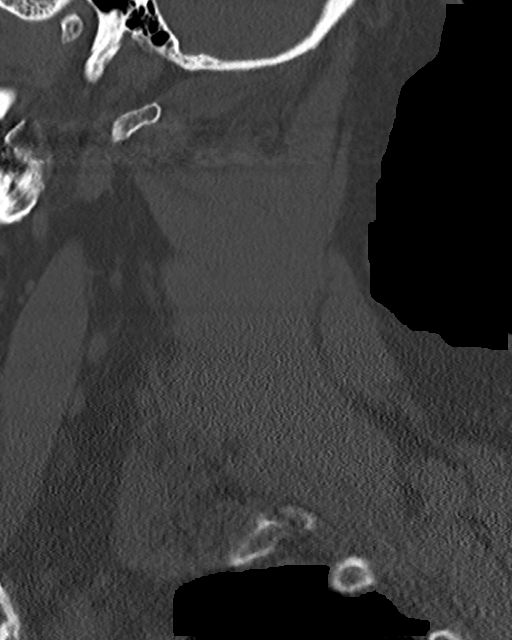
[im 25/61  bone]
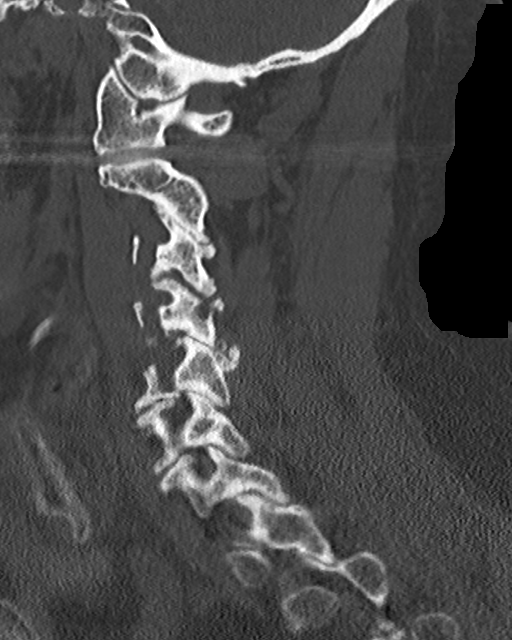
[im 37/61  bone]
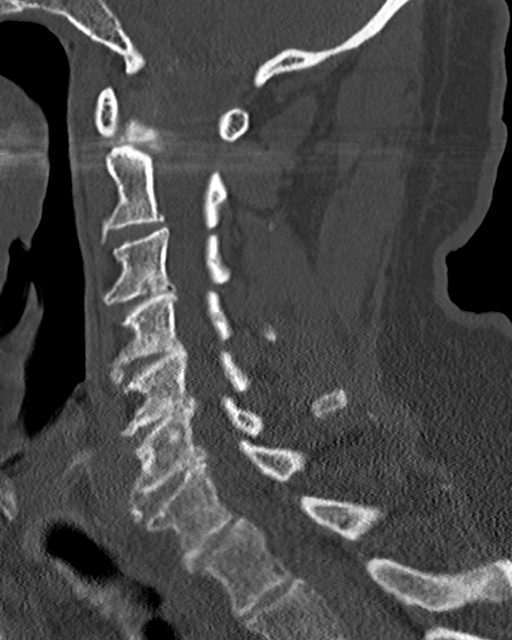
[im 49/61  bone]
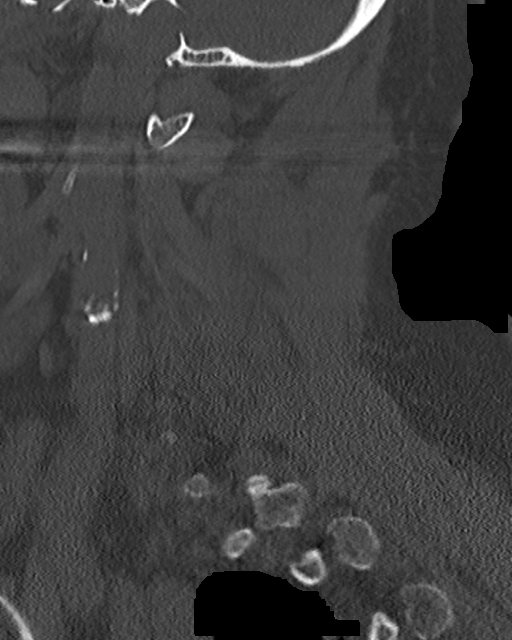

[Series 15: orthogonal axial st · axial · 0.21mm/px · z∈[-179,-119]mm · 2 of 93 slices shown]
[im 31/93  bone]
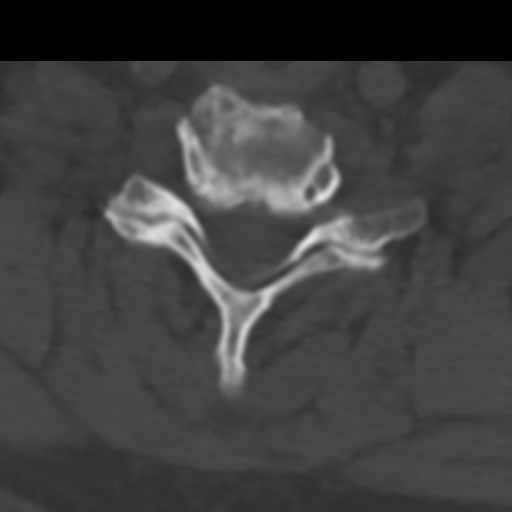
[im 62/93  bone]
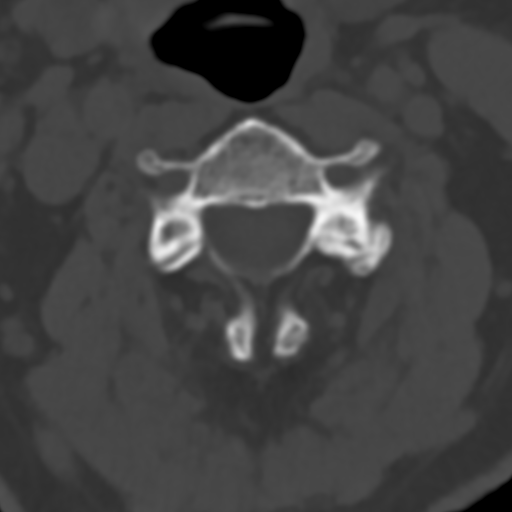

[11 of 33 positions shown; findings below may reference images not displayed]

FINDINGS: CT HEAD FINDINGS

Brain: Left tentorial subdural hematoma measuring up to 5 mm
thickness, extending to the inferior falx. No significant mass
effect or midline shift. Diffuse parenchymal atrophy. Patchy areas
of hypoattenuation in deep and periventricular white matter
bilaterally. Negative for mass lesion or acute infarct. Acute
infarct may be inapparent on noncontrast CT. Ventricles and sulci
symmetric.

Vascular: Atherosclerotic and physiologic intracranial
calcifications.

Skull: Normal. Negative for fracture or focal lesion.

Sinuses/Orbits: No acute finding.

Other: Right parietal scalp hematoma.

CT CERVICAL SPINE FINDINGS

Alignment: Normal

Skull base and vertebrae: Negative for fracture or focal bone
lesion.

Soft tissues and spinal canal: No prevertebral fluid or swelling. No
visible canal hematoma.

Disc levels: Mild narrowing C5-6 and C6-7 interspaces. Endplate
spurring at all levels C2-C7. Multilevel facet DJD.

Upper chest: Visualized  lung apices clear.

Other: Bilateral calcified carotid bifurcation plaque.
IMPRESSION: 1. 5 mm left tentorial subdural hematoma, without significant mass
effect or midline shift.
Critical Value/emergent results were called by telephone at the time
of interpretation on [DATE] at [DATE] to Dr. TIWARI ,
who verbally acknowledged these results.
2. Atrophy and nonspecific white matter changes.
3. Negative for cervical fracture or dislocation.
4. Multilevel cervical spondylitic change as above.

## 2018-05-26 MED ORDER — ACETAMINOPHEN 325 MG PO TABS: 650.0000 mg | ORAL_TABLET | Freq: Four times a day (QID) | ORAL | Status: DC | PRN

## 2018-05-26 MED ORDER — LORATADINE 10 MG PO TABS
10.0000 mg | ORAL_TABLET | Freq: Every day | ORAL | Status: DC
  Administered 2018-05-27: 10 mg via ORAL
  Filled 2018-05-26: qty 1

## 2018-05-26 MED ORDER — METFORMIN HCL 500 MG PO TABS
500.0000 mg | ORAL_TABLET | Freq: Two times a day (BID) | ORAL | Status: DC
  Administered 2018-05-27: 500 mg via ORAL
  Filled 2018-05-26: qty 1

## 2018-05-26 MED ORDER — TERAZOSIN HCL 5 MG PO CAPS
5.0000 mg | ORAL_CAPSULE | Freq: Every day | ORAL | Status: DC
  Filled 2018-05-26: qty 1

## 2018-05-26 MED ORDER — ACETAMINOPHEN 650 MG RE SUPP: 650.0000 mg | Freq: Four times a day (QID) | RECTAL | Status: DC | PRN

## 2018-05-26 MED ORDER — MONTELUKAST SODIUM 10 MG PO TABS: 10.0000 mg | ORAL_TABLET | Freq: Every day | ORAL | Status: DC

## 2018-05-26 MED ORDER — ATORVASTATIN CALCIUM 20 MG PO TABS: 20.0000 mg | ORAL_TABLET | Freq: Every day | ORAL | Status: DC

## 2018-05-26 MED ORDER — METOPROLOL SUCCINATE ER 50 MG PO TB24
50.0000 mg | ORAL_TABLET | Freq: Every day | ORAL | Status: DC
  Administered 2018-05-27: 50 mg via ORAL
  Filled 2018-05-26: qty 1

## 2018-05-26 MED ORDER — SPIRONOLACTONE 12.5 MG HALF TABLET
12.5000 mg | ORAL_TABLET | Freq: Every day | ORAL | Status: DC
  Administered 2018-05-27: 12.5 mg via ORAL
  Filled 2018-05-26: qty 1

## 2018-05-26 MED ORDER — HYDROCHLOROTHIAZIDE 25 MG PO TABS
25.0000 mg | ORAL_TABLET | Freq: Every day | ORAL | Status: DC
  Administered 2018-05-27: 25 mg via ORAL
  Filled 2018-05-26: qty 1

## 2018-05-26 MED ORDER — ONDANSETRON HCL 4 MG/2ML IJ SOLN: 4.0000 mg | Freq: Four times a day (QID) | INTRAMUSCULAR | Status: DC | PRN

## 2018-05-26 MED ORDER — TESTOSTERONE 50 MG/5GM (1%) TD GEL
5.0000 g | Freq: Every day | TRANSDERMAL | Status: DC
  Administered 2018-05-27: 5 g via TRANSDERMAL
  Filled 2018-05-26: qty 5

## 2018-05-26 MED ORDER — PANTOPRAZOLE SODIUM 40 MG PO TBEC
40.0000 mg | DELAYED_RELEASE_TABLET | Freq: Two times a day (BID) | ORAL | Status: DC
  Administered 2018-05-27: 40 mg via ORAL
  Filled 2018-05-26: qty 1

## 2018-05-26 MED ORDER — ONDANSETRON HCL 4 MG PO TABS: 4.0000 mg | ORAL_TABLET | Freq: Four times a day (QID) | ORAL | Status: DC | PRN

## 2018-05-26 MED ORDER — ALLOPURINOL 300 MG PO TABS
300.0000 mg | ORAL_TABLET | Freq: Every day | ORAL | Status: DC
  Administered 2018-05-27: 300 mg via ORAL
  Filled 2018-05-26: qty 1

## 2018-05-26 MED ORDER — IRBESARTAN 300 MG PO TABS
300.0000 mg | ORAL_TABLET | Freq: Every day | ORAL | Status: DC
  Administered 2018-05-27: 300 mg via ORAL
  Filled 2018-05-26: qty 1

## 2018-05-26 NOTE — ED Provider Notes (Signed)
[facility] DEPART[facility] Provider Note   CSN: [id number] Arrival date & time: 05/26/18  2006     History   Chief Complaint Chief Complaint  Patient presents with  . Near Syncope    HPI Peter Mullen is a 84 y.o. male.  He presents by EMS after having a fall while bowling.  He was noted by by standards to stumble to the right and then fell striking the back of his head.  Patient is amnestic to event.  He is complaining of some minor pain in the back of his head.  He denies any preceding symptoms to the fall.  He is on Plavix.  No chest pain no shortness of breath no double vision or blurry vision no numbness or weakness.  Bystanders said he did not lose consciousness.  The history is provided by the patient.  Near Syncope  This is a new problem. The current episode started less than 1 hour ago. The problem has been resolved. Associated symptoms include headaches. Pertinent negatives include no chest pain, no abdominal pain and no shortness of breath. Nothing aggravates the symptoms. Nothing relieves the symptoms. He has tried nothing for the symptoms. The treatment provided no relief.    Past Medical History:  Diagnosis Date  . Coronary artery disease   . Diabetes mellitus without complication ([place])   . Hyperlipidemia   . Hypertension   . Skin cancer   . Stomach problems     Patient Active Problem List   Diagnosis Date Noted  . First degree heart block 02/05/2015  . Second degree heart block 11/27/2014  . Obesity (BMI 30.0-34.9) 05/29/2014  . Basal cell carcinoma of cheek 10/11/2013  . HTN (hypertension) 07/05/2013  . CAD in native artery 12/28/2012  . Hypertensive heart disease 12/28/2012  . Hyperlipidemia with target LDL less than 70 12/28/2012  . Heart palpitations 12/28/2012  . DM2 (diabetes mellitus, type 2) ([place]) 12/28/2012  . Mild sleep apnea 12/28/2012    Past Surgical History:  Procedure Laterality Date  . CORONARY ARTERY  BYPASS GRAFT  2001   x4 LIMA-LAD, SVG-PDA  . [place]  . TONSILLECTOMY  1940  . TUMOR REMOVAL  1970   fatty tissue tumor  . VASECTOMY  1975        Home Medications    Prior to Admission medications   Medication Sig Start Date End Date Taking? Authorizing Provider  allopurinol (ZYLOPRIM) 300 MG tablet Take 300 mg by mouth daily. 02/10/18   [provider]  Ascorbic Acid (VITAMIN C) 1000 MG tablet Take 1,000 mg by mouth as needed.     [provider]  aspirin 81 MG tablet Take 81 mg by mouth daily.    [provider]  BIOTIN 5000 PO Take by mouth.    [provider]  cetirizine (ZYRTEC) 10 MG tablet Take 10 mg by mouth daily.    [provider]  Choline Fenofibrate (TRILIPIX) 135 MG capsule Take 135 mg by mouth daily.    [provider]  clopidogrel (PLAVIX) 75 MG tablet Take 75 mg by mouth daily.    [provider]  colchicine 0.6 MG tablet Take 1 tablet by mouth as directed. 02/16/18   [provider]  Cyanocobalamin (VITAMIN B 12 PO) Take 2,500 mcg by mouth.    [provider]  diphenhydrAMINE (BENADRYL) 25 mg capsule Take 25 mg by mouth as needed.    [provider]  diphenhydramine-acetaminophen (TYLENOL  PM) 25-500 MG TABS tablet Take 1 tablet by mouth at bedtime as needed.    [provider]  diphenoxylate-atropine (LOMOTIL) 2.5-0.025 MG per tablet Take 1 tablet by mouth 4 (four) times daily as needed for diarrhea or loose stools. 10/28/13   Kirichenko, Tatyana, PA-C  fluticasone (FLONASE) 50 MCG/ACT nasal spray Place 1 spray into both nostrils daily.    [provider]  folic acid (FOLVITE) 741 MCG tablet Take 400 mcg by mouth daily.    [provider]  hydrochlorothiazide (HYDRODIURIL) 25 MG tablet Take 25 mg by mouth daily.    [provider]  LIPITOR 40 MG tablet TAKE 1/2 (ONE-HALF) TABLET BY MOUTH ONCE DAILY 12/10/17   Troy Sine, MD    metFORMIN (GLUCOPHAGE) 500 MG tablet Take 500 mg by mouth 2 (two) times daily with a meal.     [provider]  metoprolol succinate (TOPROL-XL) 50 MG 24 hr tablet Take 1 tablet (50 mg total) by mouth daily. 01/24/18   Troy Sine, MD  montelukast (SINGULAIR) 10 MG tablet Take 1 tablet by mouth at bedtime. 05/04/14   [provider]  nitroGLYCERIN (NITROSTAT) 0.4 MG SL tablet Place 1 tablet (0.4 mg total) under the tongue every 5 (five) minutes as needed for chest pain. 05/02/18 07/31/18  Barrett, Evelene Croon, PA-C  olmesartan (BENICAR) 40 MG tablet TAKE 1 TABLET BY MOUTH ONCE DAILY 10/25/17   Troy Sine, MD  Omega-3 Fatty Acids (FISH OIL PO) Take by mouth.    [provider]  omeprazole (PRILOSEC) 20 MG capsule Take 20 mg by mouth 2 (two) times daily before a meal.    [provider]  spironolactone (ALDACTONE) 25 MG tablet TAKE 1 TABLET BY MOUTH ONCE DAILY AFTER BREAKFAST 12/21/17   Troy Sine, MD  terazosin (HYTRIN) 5 MG capsule Take 1 capsule by mouth at bedtime. 05/04/14   [provider]  testosterone (ANDROGEL) 50 MG/5GM GEL Place 5 g onto the skin daily.    [provider]  vitamin E 400 UNIT capsule Take 400 Units by mouth daily.    [provider]    Family History Family History  Problem Relation Age of Onset  . Diabetes Mother   . Heart disease Mother   . Stroke Mother   . Lung cancer Father   . Leukemia Brother   . Heart disease Maternal Grandfather   . Stroke Paternal Grandfather     Social History Social History   Tobacco Use  . Smoking status: Never Smoker  . Smokeless tobacco: Never Used  Substance Use Topics  . Alcohol use: Yes    Alcohol/week: 2.0 standard drinks    Types: 2 Standard drinks or equivalent per week  . Drug use: Not on file     Allergies   Codeine and Nsaids   Review of Systems Review of Systems  Constitutional: Negative for fever.  HENT: Negative for sore throat.   Eyes:  Negative for visual disturbance.  Respiratory: Negative for shortness of breath.   Cardiovascular: Positive for near-syncope. Negative for chest pain.  Gastrointestinal: Negative for abdominal pain.  Genitourinary: Negative for dysuria.  Musculoskeletal: Negative for neck pain.  Skin: Negative for rash.  Neurological: Positive for headaches.     Physical Exam Updated Vital Signs BP (!) 152/111 (BP Location: Right Arm)   Pulse 70   Temp 98.2 F (36.8 C) (Oral)   Resp (!) 28   Ht 5\' 6"  (1.676 m)  Wt 93 kg   SpO2 92%   BMI 33.09 kg/m   Physical Exam  Constitutional: He is oriented to person, place, and time. He appears well-developed and well-nourished.  HENT:  Head: Normocephalic.  Right Ear: External ear normal.  Left Ear: External ear normal.  Nose: Nose normal.  Mouth/Throat: Oropharynx is clear and moist.  Patient has approximately 4 cm posterior scalp hematoma.  No active bleeding.  Eyes: Conjunctivae and EOM are normal.  Patient is some irregularity of the right pupil.  He said this is baseline from prior cataract surgery.  Neck: Neck supple. No tracheal deviation present.  Cardiovascular: Normal rate and normal heart sounds. Frequent extrasystoles are present.  No murmur heard. Pulmonary/Chest: Effort normal and breath sounds normal. No respiratory distress.  Abdominal: Soft. There is no tenderness.  Musculoskeletal: He exhibits no edema, tenderness or deformity.  Neurological: He is alert and oriented to person, place, and time. He has normal strength. No cranial nerve deficit or sensory deficit. GCS eye subscore is 4. GCS verbal subscore is 5. GCS motor subscore is 6.  Skin: Skin is warm and dry. Capillary refill takes less than 2 seconds.  Psychiatric: He has a normal mood and affect.  Nursing note and vitals reviewed.    ED Treatments / Results  Labs (all labs ordered are listed, but only abnormal results are displayed) Labs Reviewed  BASIC METABOLIC  PANEL - Abnormal; Notable for the following components:      Result Value   Glucose, Bld 228 (*)    Creatinine, Ser 1.27 (*)    GFR calc non Af Amer 50 (*)    GFR calc Af Amer 58 (*)    All other components within normal limits  CBC WITH DIFFERENTIAL/PLATELET  I-STAT TROPONIN, ED    EKG EKG Interpretation  Date/Time:  Thursday May 26 2018 20:11:20 EDT Ventricular Rate:  82 PR Interval:    QRS Duration: 133 QT Interval:  408 QTC Calculation: 477 R Axis:   -65 Text Interpretation:  Sinus rhythm Ventricular trigeminy Prolonged PR interval Left bundle branch block new BBB and PVCs compared with prior 12/07 Confirmed by Aletta Edouard [phone]) on 05/26/2018 8:16:15 PM   Radiology Ct Head Wo Contrast  Result Date: 05/26/2018 CLINICAL DATA:  [facility] reports pt was bowling, stumbled to right and fell hitting head right posterior Pt is unable to remember the event. 2/10 pain in head EXAM: CT HEAD WITHOUT CONTRAST CT CERVICAL SPINE WITHOUT CONTRAST TECHNIQUE: Multidetector CT imaging of the head and cervical spine was performed following the standard protocol without intravenous contrast. Multiplanar CT image reconstructions of the cervical spine were also generated. COMPARISON:  07/02/2006. FINDINGS: CT HEAD FINDINGS Brain: Left tentorial subdural hematoma measuring up to 5 mm thickness, extending to the inferior falx. No significant mass effect or midline shift. Diffuse parenchymal atrophy. Patchy areas of hypoattenuation in deep and periventricular white matter bilaterally. Negative for mass lesion or acute infarct. Acute infarct may be inapparent on noncontrast CT. Ventricles and sulci symmetric. Vascular: Atherosclerotic and physiologic intracranial calcifications. Skull: Normal. Negative for fracture or focal lesion. Sinuses/Orbits: No acute finding. Other: Right parietal scalp hematoma. CT CERVICAL SPINE FINDINGS Alignment: Normal Skull base and vertebrae: Negative for fracture or focal  bone lesion. Soft tissues and spinal canal: No prevertebral fluid or swelling. No visible canal hematoma. Disc levels: Mild narrowing C5-6 and C6-7 interspaces. Endplate spurring at all levels C2-C7. Multilevel facet DJD. Upper chest: Visualized  lung apices clear. Other: Bilateral calcified carotid  bifurcation plaque. IMPRESSION: 1. 5 mm left tentorial subdural hematoma, without significant mass effect or midline shift. Critical Value/emergent results were called by telephone at the time of interpretation on 05/26/2018 at 9:13 pm to Dr. Aletta Edouard , who verbally acknowledged these results. 2. Atrophy and nonspecific white matter changes. 3. Negative for cervical fracture or dislocation. 4. Multilevel cervical spondylitic change as above. Electronically Signed   By: Lucrezia Europe M.D.   On: 05/26/2018 21:15   Ct Cervical Spine Wo Contrast  Result Date: 05/26/2018 CLINICAL DATA:  [facility] reports pt was bowling, stumbled to right and fell hitting head right posterior Pt is unable to remember the event. 2/10 pain in head EXAM: CT HEAD WITHOUT CONTRAST CT CERVICAL SPINE WITHOUT CONTRAST TECHNIQUE: Multidetector CT imaging of the head and cervical spine was performed following the standard protocol without intravenous contrast. Multiplanar CT image reconstructions of the cervical spine were also generated. COMPARISON:  07/02/2006. FINDINGS: CT HEAD FINDINGS Brain: Left tentorial subdural hematoma measuring up to 5 mm thickness, extending to the inferior falx. No significant mass effect or midline shift. Diffuse parenchymal atrophy. Patchy areas of hypoattenuation in deep and periventricular white matter bilaterally. Negative for mass lesion or acute infarct. Acute infarct may be inapparent on noncontrast CT. Ventricles and sulci symmetric. Vascular: Atherosclerotic and physiologic intracranial calcifications. Skull: Normal. Negative for fracture or focal lesion. Sinuses/Orbits: No acute finding. Other: Right parietal  scalp hematoma. CT CERVICAL SPINE FINDINGS Alignment: Normal Skull base and vertebrae: Negative for fracture or focal bone lesion. Soft tissues and spinal canal: No prevertebral fluid or swelling. No visible canal hematoma. Disc levels: Mild narrowing C5-6 and C6-7 interspaces. Endplate spurring at all levels C2-C7. Multilevel facet DJD. Upper chest: Visualized  lung apices clear. Other: Bilateral calcified carotid bifurcation plaque. IMPRESSION: 1. 5 mm left tentorial subdural hematoma, without significant mass effect or midline shift. Critical Value/emergent results were called by telephone at the time of interpretation on 05/26/2018 at 9:13 pm to Dr. Aletta Edouard , who verbally acknowledged these results. 2. Atrophy and nonspecific white matter changes. 3. Negative for cervical fracture or dislocation. 4. Multilevel cervical spondylitic change as above. Electronically Signed   By: Lucrezia Europe M.D.   On: 05/26/2018 21:15    Procedures .Critical Care Performed by: Hayden Rasmussen, MD Authorized by: Hayden Rasmussen, MD   Critical care provider statement:    Critical care time (minutes):  30   Critical care was necessary to treat or prevent imminent or life-threatening deterioration of the following conditions:  Trauma and CNS failure or compromise   Critical care was time spent personally by me on the following activities:  Discussions with consultants, evaluation of patient's response to treatment, examination of patient, ordering and performing treatments and interventions, ordering and review of laboratory studies, ordering and review of radiographic studies, pulse oximetry, re-evaluation of patient's condition, obtaining history from patient or surrogate, review of old charts and development of treatment plan with patient or surrogate   I assumed direction of critical care for this patient from another provider in my specialty: no     (including critical care time)  Medications Ordered in  ED Medications - No data to display   Initial Impression / Assessment and Plan / ED Course  I have reviewed the triage vital signs and the nursing notes.  Pertinent labs & imaging results that were available during my care of the patient were reviewed by me and considered in my medical decision making (see chart  for details).  Clinical Course as of May 27 1136  Thu May 26, 2018  2020 Reviewed prior cardiology notes which they comment upon left bundle branch block and frequent PACs and PVCs with sometimes some Mobitz 2 pathology.   [MB]  2130 Received a call from radiology about the patient's CAT scan of his head that showed he has a subdural.  I have informed the patient and the daughter of this and placed a consult into neurosurgery.   [MB]  2141 Discussed with Dr. [name] from neurosurgery who will evaluate the patient in the ED.   [MB]  2142 Reviewed this with the daughter.  Patient lives by himself.   [MB]  2240 Patient has been evaluated by Dr. [name] and he feels patient should be admitted to the hospital service for observation.   [MB]    Clinical Course User Index [MB] Hayden Rasmussen, MD     Final Clinical Impressions(s) / ED Diagnoses   Final diagnoses:  Subdural hematoma, acute ([place])  Hematoma of scalp, initial encounter  Fall, initial encounter  Type 2 diabetes mellitus without complication, without long-term current use of insulin [facility])    ED Discharge Orders    None       Hayden Rasmussen, MD 05/27/18 1139

## 2018-05-26 NOTE — Consult Note (Signed)
Reason for Consult:traumatic subdural hematoma Referring Physician: tisovec, richard  Peter Mullen is an 84 y.o. male.  HPI: whom while bowling fell, striking his head and briefly losing consciousness. The fall was witnessed by his friends. Once he awoke he was able to name friends, the date, and other pertinent information.  Head ct showed a small subdural hematoma overlying the tentorium causing no mass effect.   Past Medical History:  Diagnosis Date  . Coronary artery disease   . Diabetes mellitus without complication ([place])   . Hyperlipidemia   . Hypertension   . Skin cancer   . Stomach problems     Past Surgical History:  Procedure Laterality Date  . CORONARY ARTERY BYPASS GRAFT  2001   x4 LIMA-LAD, SVG-PDA  . [place]  . TONSILLECTOMY  1940  . TUMOR REMOVAL  1970   fatty tissue tumor  . VASECTOMY  1975    Family History  Problem Relation Age of Onset  . Diabetes Mother   . Heart disease Mother   . Stroke Mother   . Lung cancer Father   . Leukemia Brother   . Heart disease Maternal Grandfather   . Stroke Paternal Grandfather     Social History:  reports that he has never smoked. He has never used smokeless tobacco. He reports that he drinks about 2.0 standard drinks of alcohol per week. His drug history is not on file.  Allergies:  Allergies  Allergen Reactions  . Codeine Other (See Comments)    Unspecified   . Nsaids Other (See Comments)    Unspecified     Medications: I have reviewed the patient's current medications.  Results for orders placed or performed during the hospital encounter of 05/26/18 (from the past 48 hour(s))  Basic metabolic panel     Status: Abnormal   Collection Time: 05/26/18  8:22 PM  Result Value Ref Range   Sodium 139 135 - 145 mmol/L   Potassium 3.7 3.5 - 5.1 mmol/L   Chloride 103 98 - 111 mmol/L   CO2 23 22 - 32 mmol/L   Glucose, Bld 228 (H) 70 - 99 mg/dL   BUN 15 8 - 23 mg/dL   Creatinine, Ser 1.27 (H)  0.61 - 1.24 mg/dL   Calcium 9.1 8.9 - 10.3 mg/dL   GFR calc non Af Amer 50 (L) >60 mL/min   GFR calc Af Amer 58 (L) >60 mL/min    Comment: (NOTE) The eGFR has been calculated using the CKD EPI equation. This calculation has not been validated in all clinical situations. eGFR's persistently <60 mL/min signify possible Chronic Kidney Disease.    Anion gap 13 5 - 15    Comment: Performed at [facility] [address], [place], [place] [address]  CBC with Differential     Status: None   Collection Time: 05/26/18  8:22 PM  Result Value Ref Range   WBC 7.2 4.0 - 10.5 K/uL   RBC 4.65 4.22 - 5.81 MIL/uL   Hemoglobin 13.9 13.0 - 17.0 g/dL   HCT 44.3 39.0 - 52.0 %   MCV 95.3 80.0 - 100.0 fL   MCH 29.9 26.0 - 34.0 pg   MCHC 31.4 30.0 - 36.0 g/dL   RDW 14.3 11.5 - 15.5 %   Platelets 212 150 - 400 K/uL   nRBC 0.0 0.0 - 0.2 %   Neutrophils Relative % 52 %   Neutro Abs 3.8 1.7 - 7.7 K/uL   Lymphocytes  Relative 35 %   Lymphs Abs 2.5 0.7 - 4.0 K/uL   Monocytes Relative 8 %   Monocytes Absolute 0.6 0.1 - 1.0 K/uL   Eosinophils Relative 3 %   Eosinophils Absolute 0.2 0.0 - 0.5 K/uL   Basophils Relative 1 %   Basophils Absolute 0.0 0.0 - 0.1 K/uL   Immature Granulocytes 1 %   Abs Immature Granulocytes 0.04 0.00 - 0.07 K/uL    Comment: Performed at [facility], [place] [address], [place], [place] [address]  I-stat troponin, ED     Status: None   Collection Time: 05/26/18  8:34 PM  Result Value Ref Range   Troponin i, poc 0.00 0.00 - 0.08 ng/mL   Comment 3            Comment: Due to the release kinetics of cTnI, a negative result within the first hours of the onset of symptoms does not rule out myocardial infarction with certainty. If myocardial infarction is still suspected, repeat the test at appropriate intervals.     Ct Head Wo Contrast  Result Date: 05/26/2018 CLINICAL DATA:  [facility] reports pt was bowling, stumbled to right and fell hitting head right posterior Pt is  unable to remember the event. 2/10 pain in head EXAM: CT HEAD WITHOUT CONTRAST CT CERVICAL SPINE WITHOUT CONTRAST TECHNIQUE: Multidetector CT imaging of the head and cervical spine was performed following the standard protocol without intravenous contrast. Multiplanar CT image reconstructions of the cervical spine were also generated. COMPARISON:  07/02/2006. FINDINGS: CT HEAD FINDINGS Brain: Left tentorial subdural hematoma measuring up to 5 mm thickness, extending to the inferior falx. No significant mass effect or midline shift. Diffuse parenchymal atrophy. Patchy areas of hypoattenuation in deep and periventricular white matter bilaterally. Negative for mass lesion or acute infarct. Acute infarct may be inapparent on noncontrast CT. Ventricles and sulci symmetric. Vascular: Atherosclerotic and physiologic intracranial calcifications. Skull: Normal. Negative for fracture or focal lesion. Sinuses/Orbits: No acute finding. Other: Right parietal scalp hematoma. CT CERVICAL SPINE FINDINGS Alignment: Normal Skull base and vertebrae: Negative for fracture or focal bone lesion. Soft tissues and spinal canal: No prevertebral fluid or swelling. No visible canal hematoma. Disc levels: Mild narrowing C5-6 and C6-7 interspaces. Endplate spurring at all levels C2-C7. Multilevel facet DJD. Upper chest: Visualized  lung apices clear. Other: Bilateral calcified carotid bifurcation plaque. IMPRESSION: 1. 5 mm left tentorial subdural hematoma, without significant mass effect or midline shift. Critical Value/emergent results were called by telephone at the time of interpretation on 05/26/2018 at 9:13 pm to Dr. Aletta Edouard , who verbally acknowledged these results. 2. Atrophy and nonspecific white matter changes. 3. Negative for cervical fracture or dislocation. 4. Multilevel cervical spondylitic change as above. Electronically Signed   By: Lucrezia Europe M.D.   On: 05/26/2018 21:15   Ct Cervical Spine Wo Contrast  Result Date:  05/26/2018 CLINICAL DATA:  [facility] reports pt was bowling, stumbled to right and fell hitting head right posterior Pt is unable to remember the event. 2/10 pain in head EXAM: CT HEAD WITHOUT CONTRAST CT CERVICAL SPINE WITHOUT CONTRAST TECHNIQUE: Multidetector CT imaging of the head and cervical spine was performed following the standard protocol without intravenous contrast. Multiplanar CT image reconstructions of the cervical spine were also generated. COMPARISON:  07/02/2006. FINDINGS: CT HEAD FINDINGS Brain: Left tentorial subdural hematoma measuring up to 5 mm thickness, extending to the inferior falx. No significant mass effect or midline shift. Diffuse parenchymal atrophy. Patchy areas of hypoattenuation  in deep and periventricular white matter bilaterally. Negative for mass lesion or acute infarct. Acute infarct may be inapparent on noncontrast CT. Ventricles and sulci symmetric. Vascular: Atherosclerotic and physiologic intracranial calcifications. Skull: Normal. Negative for fracture or focal lesion. Sinuses/Orbits: No acute finding. Other: Right parietal scalp hematoma. CT CERVICAL SPINE FINDINGS Alignment: Normal Skull base and vertebrae: Negative for fracture or focal bone lesion. Soft tissues and spinal canal: No prevertebral fluid or swelling. No visible canal hematoma. Disc levels: Mild narrowing C5-6 and C6-7 interspaces. Endplate spurring at all levels C2-C7. Multilevel facet DJD. Upper chest: Visualized  lung apices clear. Other: Bilateral calcified carotid bifurcation plaque. IMPRESSION: 1. 5 mm left tentorial subdural hematoma, without significant mass effect or midline shift. Critical Value/emergent results were called by telephone at the time of interpretation on 05/26/2018 at 9:13 pm to Dr. Aletta Edouard , who verbally acknowledged these results. 2. Atrophy and nonspecific white matter changes. 3. Negative for cervical fracture or dislocation. 4. Multilevel cervical spondylitic change as above.  Electronically Signed   By: Lucrezia Europe M.D.   On: 05/26/2018 21:15    Review of Systems  Constitutional: Negative.   HENT: Negative.   Eyes: Negative.   Respiratory: Negative.   Cardiovascular: Negative.   Musculoskeletal: Positive for falls.  Skin: Negative.   Neurological: Positive for headaches.  Endo/Heme/Allergies: Negative.   Psychiatric/Behavioral: Negative.    Blood pressure (!) 152/111, pulse 70, temperature 98.2 F (36.8 C), temperature source Oral, resp. rate (!) 28, height 5' 6" (1.676 m), weight 93 kg, SpO2 92 %. Physical Exam  Constitutional: He is oriented to person, place, and time. He appears well-developed and well-nourished. No distress.  HENT:  Subgaleal hemorrhage right parietal area, ecchymotic  Eyes: EOM are normal.  Pupils irregular, cataract surgery Pupils reactive Full eom  Neck: Normal range of motion. Neck supple.  Cardiovascular:  Bradycardic at times  Respiratory: Effort normal and breath sounds normal.  GI: Soft. Bowel sounds are normal.  Neurological: He is alert and oriented to person, place, and time. He has normal strength and normal reflexes. A cranial nerve deficit is present. No sensory deficit. He displays no Babinski's sign on the right side. He displays no Babinski's sign on the left side.  Decreased hearing to voice Gait not assessed Normal proprioception  Skin: He is not diaphoretic.    Assessment/Plan: RIHAAN BARRACK is a 84 y.o. male Whom fell while bowling tonight, is amnestic for the event, with a tentorial subdural on the left. Basal cisterns patent, no mass effect. No operative intervention indicated. No repeat head CT needed unless exam were to change. Ok to discharge in am   , L 05/26/2018, 10:30 PM

## 2018-05-26 NOTE — H&P (Signed)
History and Physical    Peter Mullen TKZ:[id number] DOB: 12/13/1931 DOA: 05/26/2018  PCP: Haywood Pao, MD  Patient coming from: Home  I have personally briefly reviewed patient's old medical records in [place]  Chief Complaint: Fall, hit head  HPI: Peter Mullen is a 84 y.o. male with medical history significant of CAD, DM2, HTN.  Patient suffered mechanical fall today while bowling.  No LOC prior to hitting head though did have short LOC after hitting head.  No SOB, CP, lightheadedness, etc prior to event.  Says his knee just gave out.  Rapid return to baseline mental status, is having headache.  ED Course: Found to have small SDH on CT scan.   Review of Systems: As per HPI otherwise 10 point review of systems negative.   Past Medical History:  Diagnosis Date  . Coronary artery disease   . Diabetes mellitus without complication ([place])   . Hyperlipidemia   . Hypertension   . Skin cancer   . Stomach problems     Past Surgical History:  Procedure Laterality Date  . CORONARY ARTERY BYPASS GRAFT  2001   x4 LIMA-LAD, SVG-PDA  . [place]  . TONSILLECTOMY  1940  . TUMOR REMOVAL  1970   fatty tissue tumor  . VASECTOMY  1975     reports that he has never smoked. He has never used smokeless tobacco. He reports that he drinks about 2.0 standard drinks of alcohol per week. His drug history is not on file.  Allergies  Allergen Reactions  . Codeine Other (See Comments)    Unspecified   . Nsaids Other (See Comments)    Unspecified     Family History  Problem Relation Age of Onset  . Diabetes Mother   . Heart disease Mother   . Stroke Mother   . Lung cancer Father   . Leukemia Brother   . Heart disease Maternal Grandfather   . Stroke Paternal Grandfather      Prior to Admission medications   Medication Sig Start Date End Date Taking? Authorizing Provider  allopurinol (ZYLOPRIM) 300 MG tablet Take 300 mg by mouth daily.  02/10/18   [provider]  Ascorbic Acid (VITAMIN C) 1000 MG tablet Take 1,000 mg by mouth as needed.     [provider]  aspirin 81 MG tablet Take 81 mg by mouth daily.    [provider]  BIOTIN 5000 PO Take by mouth.    [provider]  cetirizine (ZYRTEC) 10 MG tablet Take 10 mg by mouth daily.    [provider]  Choline Fenofibrate (TRILIPIX) 135 MG capsule Take 135 mg by mouth daily.    [provider]  clopidogrel (PLAVIX) 75 MG tablet Take 75 mg by mouth daily.    [provider]  colchicine 0.6 MG tablet Take 1 tablet by mouth as directed. 02/16/18   [provider]  Cyanocobalamin (VITAMIN B 12 PO) Take 2,500 mcg by mouth.    [provider]  diphenhydrAMINE (BENADRYL) 25 mg capsule Take 25 mg by mouth as needed.    [provider]  diphenhydramine-acetaminophen (TYLENOL PM) 25-500 MG TABS tablet Take 1 tablet by mouth at bedtime as needed.    [provider]  diphenoxylate-atropine (LOMOTIL) 2.5-0.025 MG per tablet Take 1 tablet by mouth 4 (four) times daily as needed for diarrhea or loose stools. 10/28/13   Kirichenko, Tatyana, PA-C  fluticasone (FLONASE) 50 MCG/ACT nasal spray  Place 1 spray into both nostrils daily.    [provider]  folic acid (FOLVITE) 902 MCG tablet Take 400 mcg by mouth daily.    [provider]  hydrochlorothiazide (HYDRODIURIL) 25 MG tablet Take 25 mg by mouth daily.    [provider]  LIPITOR 40 MG tablet TAKE 1/2 (ONE-HALF) TABLET BY MOUTH ONCE DAILY 12/10/17   Troy Sine, MD  metFORMIN (GLUCOPHAGE) 500 MG tablet Take 500 mg by mouth 2 (two) times daily with a meal.     [provider]  metoprolol succinate (TOPROL-XL) 50 MG 24 hr tablet Take 1 tablet (50 mg total) by mouth daily. 01/24/18   Troy Sine, MD  montelukast (SINGULAIR) 10 MG tablet Take 1 tablet by mouth at bedtime. 05/04/14   [provider]    nitroGLYCERIN (NITROSTAT) 0.4 MG SL tablet Place 1 tablet (0.4 mg total) under the tongue every 5 (five) minutes as needed for chest pain. 05/02/18 07/31/18  Barrett, Evelene Croon, PA-C  olmesartan (BENICAR) 40 MG tablet TAKE 1 TABLET BY MOUTH ONCE DAILY 10/25/17   Troy Sine, MD  Omega-3 Fatty Acids (FISH OIL PO) Take by mouth.    [provider]  omeprazole (PRILOSEC) 20 MG capsule Take 20 mg by mouth 2 (two) times daily before a meal.    [provider]  spironolactone (ALDACTONE) 25 MG tablet TAKE 1 TABLET BY MOUTH ONCE DAILY AFTER BREAKFAST 12/21/17   Troy Sine, MD  terazosin (HYTRIN) 5 MG capsule Take 1 capsule by mouth at bedtime. 05/04/14   [provider]  testosterone (ANDROGEL) 50 MG/5GM GEL Place 5 g onto the skin daily.    [provider]  vitamin E 400 UNIT capsule Take 400 Units by mouth daily.    [provider]    Physical Exam: Vitals:   05/26/18 2145 05/26/18 2159 05/26/18 2200 05/26/18 2215  BP: (!) 181/85  (!) 178/77 (!) 172/89  Pulse: 77  (!) 59 77  Resp:  (!) 23  20  Temp:      TempSrc:      SpO2: 94%  94% 95%  Weight:      Height:        Constitutional: NAD, calm, comfortable Eyes: Pupils irregular due to prior surgery, EOMI lids and conjunctivae normal ENMT: Mucous membranes are moist. Posterior pharynx clear of any exudate or lesions.Normal dentition.  Neck: normal, supple, no masses, no thyromegaly Respiratory: clear to auscultation bilaterally, no wheezing, no crackles. Normal respiratory effort. No accessory muscle use.  Cardiovascular: regularly irregular. Abdomen: no tenderness, no masses palpated. No hepatosplenomegaly. Bowel sounds positive.  Musculoskeletal: no clubbing / cyanosis. No joint deformity upper and lower extremities. Good ROM, no contractures. Normal muscle tone.  Skin: no rashes, lesions, ulcers. No induration Neurologic: CN 2-12 grossly intact. Sensation intact, DTR normal. Strength 5/5 in  all 4.  Psychiatric: Normal judgment and insight. Alert and oriented x 3. Normal mood.    Labs on Admission: I have personally reviewed following labs and imaging studies  CBC: Recent Labs  Lab 05/26/18 2022  WBC 7.2  NEUTROABS 3.8  HGB 13.9  HCT 44.3  MCV 95.3  PLT 409   Basic Metabolic Panel: Recent Labs  Lab 05/26/18 2022  NA 139  K 3.7  CL 103  CO2 23  GLUCOSE 228*  BUN 15  CREATININE 1.27*  CALCIUM 9.1   GFR: Estimated Creatinine Clearance: 46.2 mL/min (A) (by C-G formula based on SCr of  1.27 mg/dL (H)). Liver Function Tests: No results for input(s): AST, ALT, ALKPHOS, BILITOT, PROT, ALBUMIN in the last 168 hours. No results for input(s): LIPASE, AMYLASE in the last 168 hours. No results for input(s): AMMONIA in the last 168 hours. Coagulation Profile: No results for input(s): INR, PROTIME in the last 168 hours. Cardiac Enzymes: No results for input(s): CKTOTAL, CKMB, CKMBINDEX, TROPONINI in the last 168 hours. BNP (last 3 results) No results for input(s): PROBNP in the last 8760 hours. HbA1C: No results for input(s): HGBA1C in the last 72 hours. CBG: No results for input(s): GLUCAP in the last 168 hours. Lipid Profile: No results for input(s): CHOL, HDL, LDLCALC, TRIG, CHOLHDL, LDLDIRECT in the last 72 hours. Thyroid Function Tests: No results for input(s): TSH, T4TOTAL, FREET4, T3FREE, THYROIDAB in the last 72 hours. Anemia Panel: No results for input(s): VITAMINB12, FOLATE, FERRITIN, TIBC, IRON, RETICCTPCT in the last 72 hours. Urine analysis:    Component Value Date/Time   COLORURINE YELLOW 10/28/2013 1921   APPEARANCEUR CLEAR 10/28/2013 1921   LABSPEC 1.009 10/28/2013 1921   PHURINE 5.0 10/28/2013 1921   GLUCOSEU NEGATIVE 10/28/2013 1921   HGBUR NEGATIVE 10/28/2013 1921   BILIRUBINUR NEGATIVE 10/28/2013 1921   KETONESUR NEGATIVE 10/28/2013 1921   PROTEINUR NEGATIVE 10/28/2013 1921   UROBILINOGEN 0.2 10/28/2013 1921   NITRITE NEGATIVE  10/28/2013 1921   LEUKOCYTESUR NEGATIVE 10/28/2013 1921    Radiological Exams on Admission: Ct Head Wo Contrast  Result Date: 05/26/2018 CLINICAL DATA:  [facility] reports pt was bowling, stumbled to right and fell hitting head right posterior Pt is unable to remember the event. 2/10 pain in head EXAM: CT HEAD WITHOUT CONTRAST CT CERVICAL SPINE WITHOUT CONTRAST TECHNIQUE: Multidetector CT imaging of the head and cervical spine was performed following the standard protocol without intravenous contrast. Multiplanar CT image reconstructions of the cervical spine were also generated. COMPARISON:  07/02/2006. FINDINGS: CT HEAD FINDINGS Brain: Left tentorial subdural hematoma measuring up to 5 mm thickness, extending to the inferior falx. No significant mass effect or midline shift. Diffuse parenchymal atrophy. Patchy areas of hypoattenuation in deep and periventricular white matter bilaterally. Negative for mass lesion or acute infarct. Acute infarct may be inapparent on noncontrast CT. Ventricles and sulci symmetric. Vascular: Atherosclerotic and physiologic intracranial calcifications. Skull: Normal. Negative for fracture or focal lesion. Sinuses/Orbits: No acute finding. Other: Right parietal scalp hematoma. CT CERVICAL SPINE FINDINGS Alignment: Normal Skull base and vertebrae: Negative for fracture or focal bone lesion. Soft tissues and spinal canal: No prevertebral fluid or swelling. No visible canal hematoma. Disc levels: Mild narrowing C5-6 and C6-7 interspaces. Endplate spurring at all levels C2-C7. Multilevel facet DJD. Upper chest: Visualized  lung apices clear. Other: Bilateral calcified carotid bifurcation plaque. IMPRESSION: 1. 5 mm left tentorial subdural hematoma, without significant mass effect or midline shift. Critical Value/emergent results were called by telephone at the time of interpretation on 05/26/2018 at 9:13 pm to Dr. Aletta Edouard , who verbally acknowledged these results. 2. Atrophy and  nonspecific white matter changes. 3. Negative for cervical fracture or dislocation. 4. Multilevel cervical spondylitic change as above. Electronically Signed   By: Lucrezia Europe M.D.   On: 05/26/2018 21:15   Ct Cervical Spine Wo Contrast  Result Date: 05/26/2018 CLINICAL DATA:  [facility] reports pt was bowling, stumbled to right and fell hitting head right posterior Pt is unable to remember the event. 2/10 pain in head EXAM: CT HEAD WITHOUT CONTRAST CT CERVICAL SPINE WITHOUT CONTRAST TECHNIQUE: Multidetector CT imaging of  the head and cervical spine was performed following the standard protocol without intravenous contrast. Multiplanar CT image reconstructions of the cervical spine were also generated. COMPARISON:  07/02/2006. FINDINGS: CT HEAD FINDINGS Brain: Left tentorial subdural hematoma measuring up to 5 mm thickness, extending to the inferior falx. No significant mass effect or midline shift. Diffuse parenchymal atrophy. Patchy areas of hypoattenuation in deep and periventricular white matter bilaterally. Negative for mass lesion or acute infarct. Acute infarct may be inapparent on noncontrast CT. Ventricles and sulci symmetric. Vascular: Atherosclerotic and physiologic intracranial calcifications. Skull: Normal. Negative for fracture or focal lesion. Sinuses/Orbits: No acute finding. Other: Right parietal scalp hematoma. CT CERVICAL SPINE FINDINGS Alignment: Normal Skull base and vertebrae: Negative for fracture or focal bone lesion. Soft tissues and spinal canal: No prevertebral fluid or swelling. No visible canal hematoma. Disc levels: Mild narrowing C5-6 and C6-7 interspaces. Endplate spurring at all levels C2-C7. Multilevel facet DJD. Upper chest: Visualized  lung apices clear. Other: Bilateral calcified carotid bifurcation plaque. IMPRESSION: 1. 5 mm left tentorial subdural hematoma, without significant mass effect or midline shift. Critical Value/emergent results were called by telephone at the time of  interpretation on 05/26/2018 at 9:13 pm to Dr. Aletta Edouard , who verbally acknowledged these results. 2. Atrophy and nonspecific white matter changes. 3. Negative for cervical fracture or dislocation. 4. Multilevel cervical spondylitic change as above. Electronically Signed   By: Lucrezia Europe M.D.   On: 05/26/2018 21:15    EKG: Independently reviewed.  Assessment/Plan Principal Problem:   SDH (subdural hematoma) (HCC) Active Problems:   CAD in native artery   DM2 (diabetes mellitus, type 2) ([facility])   HTN (hypertension)   AV block, [place] 1    1. SDH - 1. Holding ASA and Plavix 2. Obs overnight 3. No need for repeat CT in AM unless neuro exam changes. 4. Can DC in AM unless exam change. 2. CAD - 1. Holding ASA and plavix due to bleed 2. Continue other meds 3. Mobitz 1 - 1. Currently Vent trigeminy 2. The BBB appears to be present on prior EKGs at cardiologists office including earlier this month. 4. DM2 - 1. Cont home meds 5. HTN - cont home meds  DVT prophylaxis: SCDs Code Status: Full Family Communication: Family at bedside Disposition Plan: Home likely in AM Consults called: NS Dr. [name] Admission status: Place in [place], [place] M. DO [facility] Pager [phone] Only works nights!  If 7AM-7PM, please contact the primary day team physician taking care of patient  [email or web address] Password [email or web address]  05/26/2018, 11:08 PM

## 2018-05-26 NOTE — ED Triage Notes (Signed)
[facility] reports pt was bowling, stumbled to right and fell hitting head right posterior Pt is unable to remember the event. 2/10 pain in head. Family is on the way.

## 2018-05-26 NOTE — ED Notes (Signed)
Attempted report x1. 

## 2018-05-26 NOTE — ED Notes (Signed)
Patient transported to CT 

## 2018-05-27 ENCOUNTER — Telehealth: Payer: Self-pay | Admitting: Cardiovascular Disease

## 2018-05-27 DIAGNOSIS — I441 Atrioventricular block, second degree: Secondary | ICD-10-CM

## 2018-05-27 DIAGNOSIS — S065X9A Traumatic subdural hemorrhage with loss of consciousness of unspecified duration, initial encounter: Secondary | ICD-10-CM | POA: Diagnosis not present

## 2018-05-27 DIAGNOSIS — I2510 Atherosclerotic heart disease of native coronary artery without angina pectoris: Secondary | ICD-10-CM | POA: Diagnosis not present

## 2018-05-27 DIAGNOSIS — I10 Essential (primary) hypertension: Secondary | ICD-10-CM

## 2018-05-27 DIAGNOSIS — E119 Type 2 diabetes mellitus without complications: Secondary | ICD-10-CM

## 2018-05-27 MED ORDER — CLOPIDOGREL BISULFATE 75 MG PO TABS: 75.0000 mg | ORAL_TABLET | Freq: Every day | ORAL | Status: DC

## 2018-05-27 NOTE — Progress Notes (Signed)
Pt ready for discharge, he is going home with one of his daughters.  DC nstructions given and reviewed.

## 2018-05-27 NOTE — Telephone Encounter (Signed)
Needs to be commented on by Dr. Claiborne Billings

## 2018-05-27 NOTE — Telephone Encounter (Signed)
New Message:      Pt c/o medication issue:  1. Name of Medication: clopidogrel (PLAVIX) 75 MG tablet  2. How are you currently taking this medication (dosage and times per day)? Take 1 tablet (75 mg total) by mouth daily.  3. Are you having a reaction (difficulty breathing--STAT)? No  4. What is your medication issue? Pt states he had a fall on yesterday and he his head pretty hard. He went to the ER and they told him he had some hemorrhaging and told the pt to stop taking his Plavix for 2 weeks. Pt wanted to inform the Dr. And stated if we have any questions to please reach out to him.

## 2018-05-27 NOTE — Telephone Encounter (Signed)
Can you please advise on this--Thanks 

## 2018-05-27 NOTE — Discharge Summary (Signed)
Physician Discharge Summary  Peter Mullen YCX:[id number] DOB: 25-Nov-1931 DOA: 05/26/2018  PCP: Haywood Pao, MD  Admit date: 05/26/2018 Discharge date: 05/27/2018  Admitted From: Home Disposition: Home   Recommendations for Outpatient Follow-up:  1. Follow up with PCP in 1-2 weeks. continue aspirin, hold plavix and limit activities for 2 weeks per neurosurgery recommendations. No need for follow up imaging unless exam changes.   Home Health: None Equipment/Devices: None Discharge Condition: Stable CODE STATUS: Full Diet recommendation: Heart healthy, carb-modified  Brief/Interim Summary: Peter Mullen is an active 84 y.o. male with a history of CAD s/p CABG, TIA, T2DM, and HTN who presented to the ED after a mechanical fall while bowling. He is amnestic for the event but bystander report states he was actively bowling and slipped, hit back of head on the right and landed on the right side with brief LOC without seizure-like activity noted. Quickly returned to baseline mental status/fully oriented. CT of the head demonstrated a small tentorial SDH without mass effect and neurologic exam was nonfocal, nonacute. Neurosurgery was consulted, recommended holding plavix and observing overnight. No neurological changes have occurred, so he is stable for discharge with plan to follow up with PCP in 2 weeks and stay with his daughter during this time for additional support.  Discharge Diagnoses:  Principal Problem:   SDH (subdural hematoma) (HCC) Active Problems:   CAD in native artery   DM2 (diabetes mellitus, type 2) (HCC)   HTN (hypertension)   AV block, Mobitz 1  SDH:  - Dr. [name] assessed patient, recommended holding plavix for 2 weeks, ok to continue aspirin. This was told to the patient and daughter. Observed overnight without change in neurologic exam, so no further work up warranted.   Right hand nonblanchable splotchy erythema: Suspected to be due to small vessel  injury in bowling hand due to landing on that hand in the fall. Exam is benign. Anticipate improvement, but return precautions were advised.   Mobitz I, BBB: Appears stable based on prior ECGs. No report of syncope.  - Follow up with cardiology as routine, Dr. Claiborne Billings.  CAD:  - Ok to restart aspirin per neurosurgery, will hold plavix for now. Continue other medications  T2DM: Continue home medications  HTN: Continue home medications  Discharge Instructions Discharge Instructions    Diet - low sodium heart healthy   Complete by:  As directed    Diet Carb Modified   Complete by:  As directed    Discharge instructions   Complete by:  As directed    Follow up with your primary doctor in about 2 weeks. Hold plavix until then. Return sooner if the redness on the hand worsens or you develop weakness or numbness anywhere. Seek medical attention right away if you pass out. Follow up with Dr. Claiborne Billings as scheduled.   Increase activity slowly   Complete by:  As directed      Allergies as of 05/27/2018      Reactions   Codeine Other (See Comments)   Unspecified   Nsaids Other (See Comments)   Unspecified      Medication List    TAKE these medications   allopurinol 300 MG tablet Commonly known as:  ZYLOPRIM Take 300 mg by mouth daily.   aspirin 81 MG tablet Take 81 mg by mouth every evening.   cetirizine 10 MG tablet Commonly known as:  ZYRTEC Take 10 mg by mouth daily.   clopidogrel 75 MG tablet Commonly known as:  PLAVIX Take 1 tablet (75 mg total) by mouth daily. Start taking on:  06/09/2018 What changed:  These instructions start on 06/09/2018. If you are unsure what to do until then, ask your doctor or other care provider.   hydrochlorothiazide 25 MG tablet Commonly known as:  HYDRODIURIL Take 25 mg by mouth daily.   LIPITOR 40 MG tablet Generic drug:  atorvastatin TAKE 1/2 (ONE-HALF) TABLET BY MOUTH ONCE DAILY What changed:  See the new instructions.   metFORMIN 500  MG tablet Commonly known as:  GLUCOPHAGE Take 500 mg by mouth 2 (two) times daily with a meal.   metoprolol succinate 50 MG 24 hr tablet Commonly known as:  TOPROL-XL Take 1 tablet (50 mg total) by mouth daily.   montelukast 10 MG tablet Commonly known as:  SINGULAIR Take 10 mg by mouth at bedtime.   nitroGLYCERIN 0.4 MG SL tablet Commonly known as:  NITROSTAT Place 1 tablet (0.4 mg total) under the tongue every 5 (five) minutes as needed for chest pain.   olmesartan 40 MG tablet Commonly known as:  BENICAR TAKE 1 TABLET BY MOUTH ONCE DAILY   omeprazole 20 MG capsule Commonly known as:  PRILOSEC Take 20 mg by mouth 2 (two) times daily before a meal.   spironolactone 25 MG tablet Commonly known as:  ALDACTONE TAKE 1 TABLET BY MOUTH ONCE DAILY AFTER BREAKFAST What changed:  See the new instructions.   terazosin 5 MG capsule Commonly known as:  HYTRIN Take 5 mg by mouth at bedtime.   testosterone 50 MG/5GM (1%) Gel Commonly known as:  ANDROGEL Place 5 g onto the skin daily.      Follow-up Information    Tisovec, Fransico Him, MD. Schedule an appointment as soon as possible for a visit in 2 week(s).   Specialty:  Internal Medicine Contact information: [place] [address] [phone]        Troy Sine, MD .   Specialty:  Cardiology Contact information: [address] [place] [address] [phone]          Allergies  Allergen Reactions  . Codeine Other (See Comments)    Unspecified   . Nsaids Other (See Comments)    Unspecified     Consultations:  Neurosurgery  Procedures/Studies: Ct Head Wo Contrast  Result Date: 05/26/2018 CLINICAL DATA:  [facility] reports pt was bowling, stumbled to right and fell hitting head right posterior Pt is unable to remember the event. 2/10 pain in head EXAM: CT HEAD WITHOUT CONTRAST CT CERVICAL SPINE WITHOUT CONTRAST TECHNIQUE: Multidetector CT imaging of the head and cervical  spine was performed following the standard protocol without intravenous contrast. Multiplanar CT image reconstructions of the cervical spine were also generated. COMPARISON:  07/02/2006. FINDINGS: CT HEAD FINDINGS Brain: Left tentorial subdural hematoma measuring up to 5 mm thickness, extending to the inferior falx. No significant mass effect or midline shift. Diffuse parenchymal atrophy. Patchy areas of hypoattenuation in deep and periventricular white matter bilaterally. Negative for mass lesion or acute infarct. Acute infarct may be inapparent on noncontrast CT. Ventricles and sulci symmetric. Vascular: Atherosclerotic and physiologic intracranial calcifications. Skull: Normal. Negative for fracture or focal lesion. Sinuses/Orbits: No acute finding. Other: Right parietal scalp hematoma. CT CERVICAL SPINE FINDINGS Alignment: Normal Skull base and vertebrae: Negative for fracture or focal bone lesion. Soft tissues and spinal canal: No prevertebral fluid or swelling. No visible canal hematoma. Disc levels: Mild narrowing C5-6 and C6-7 interspaces. Endplate spurring at all  levels C2-C7. Multilevel facet DJD. Upper chest: Visualized  lung apices clear. Other: Bilateral calcified carotid bifurcation plaque. IMPRESSION: 1. 5 mm left tentorial subdural hematoma, without significant mass effect or midline shift. Critical Value/emergent results were called by telephone at the time of interpretation on 05/26/2018 at 9:13 pm to Dr. Aletta Edouard , who verbally acknowledged these results. 2. Atrophy and nonspecific white matter changes. 3. Negative for cervical fracture or dislocation. 4. Multilevel cervical spondylitic change as above. Electronically Signed   By: Lucrezia Europe M.D.   On: 05/26/2018 21:15   Ct Cervical Spine Wo Contrast  Result Date: 05/26/2018 CLINICAL DATA:  [facility] reports pt was bowling, stumbled to right and fell hitting head right posterior Pt is unable to remember the event. 2/10 pain in head EXAM: CT  HEAD WITHOUT CONTRAST CT CERVICAL SPINE WITHOUT CONTRAST TECHNIQUE: Multidetector CT imaging of the head and cervical spine was performed following the standard protocol without intravenous contrast. Multiplanar CT image reconstructions of the cervical spine were also generated. COMPARISON:  07/02/2006. FINDINGS: CT HEAD FINDINGS Brain: Left tentorial subdural hematoma measuring up to 5 mm thickness, extending to the inferior falx. No significant mass effect or midline shift. Diffuse parenchymal atrophy. Patchy areas of hypoattenuation in deep and periventricular white matter bilaterally. Negative for mass lesion or acute infarct. Acute infarct may be inapparent on noncontrast CT. Ventricles and sulci symmetric. Vascular: Atherosclerotic and physiologic intracranial calcifications. Skull: Normal. Negative for fracture or focal lesion. Sinuses/Orbits: No acute finding. Other: Right parietal scalp hematoma. CT CERVICAL SPINE FINDINGS Alignment: Normal Skull base and vertebrae: Negative for fracture or focal bone lesion. Soft tissues and spinal canal: No prevertebral fluid or swelling. No visible canal hematoma. Disc levels: Mild narrowing C5-6 and C6-7 interspaces. Endplate spurring at all levels C2-C7. Multilevel facet DJD. Upper chest: Visualized  lung apices clear. Other: Bilateral calcified carotid bifurcation plaque. IMPRESSION: 1. 5 mm left tentorial subdural hematoma, without significant mass effect or midline shift. Critical Value/emergent results were called by telephone at the time of interpretation on 05/26/2018 at 9:13 pm to Dr. Aletta Edouard , who verbally acknowledged these results. 2. Atrophy and nonspecific white matter changes. 3. Negative for cervical fracture or dislocation. 4. Multilevel cervical spondylitic change as above. Electronically Signed   By: Lucrezia Europe M.D.   On: 05/26/2018 21:15   Vas [place] Lower Ext Art Seg Multi (segmentals & [place])  Result Date: 05/06/2018 LOWER EXTREMITY  DOPPLER STUDY Indications: Claudication. Patient reports having pain in his legs while              walking. The pain starts in the top of his foot and radiates to the              front and back of his shins, then the back of his legs and into his              buttocks. Patient also reports problems with gout and spinal              stenosis. High Risk         Hypertension, hyperlipidemia, Diabetes, coronary artery Factors:          disease.  Performing Technologist: Chesley Noon RVT  Examination Guidelines: A complete evaluation includes at minimum, Doppler waveform signals and systolic blood pressure reading at the level of bilateral brachial, anterior tibial, and posterior tibial arteries, when vessel segments are accessible. Bilateral testing is considered an integral part of a complete examination. Photoelectric  Plethysmograph (PPG) waveforms and toe systolic pressure readings are included as required and additional duplex testing as needed. Limited examinations for reoccurring indications may be performed as noted.  ABI Findings: +---------+------------------+-----+---------+--------+ Right    Rt Pressure (mmHg)IndexWaveform Comment  +---------+------------------+-----+---------+--------+ Brachial 155                                      +---------+------------------+-----+---------+--------+ CFA                             triphasic         +---------+------------------+-----+---------+--------+ Popliteal                       triphasic         +---------+------------------+-----+---------+--------+ ATA      254               1.61 triphasic         +---------+------------------+-----+---------+--------+ PTA      254               1.61 triphasic         +---------+------------------+-----+---------+--------+ PERO     254               1.61 triphasic         +---------+------------------+-----+---------+--------+ Great Toe152               0.96 Normal             +---------+------------------+-----+---------+--------+ +---------+------------------+-----+---------+-------+ Left     Lt Pressure (mmHg)IndexWaveform Comment +---------+------------------+-----+---------+-------+ Brachial 158                                     +---------+------------------+-----+---------+-------+ CFA                             triphasic        +---------+------------------+-----+---------+-------+ Popliteal                       triphasic        +---------+------------------+-----+---------+-------+ ATA      254               1.61 triphasic        +---------+------------------+-----+---------+-------+ PTA      254               1.61 triphasic        +---------+------------------+-----+---------+-------+ PERO     254               1.61 triphasic        +---------+------------------+-----+---------+-------+ Great Toe140               0.89 Normal           +---------+------------------+-----+---------+-------+ Arterial wall calcification precludes accurate ankle pressures and ABIs.  Summary: Right: Resting right ankle-brachial index indicates noncompressible right lower extremity arteries.The right toe-brachial index is normal. Waveform analysis demonstrates no evidence of significant peripheral arterial disease in the right lower extremity. Left: Resting left ankle-brachial index indicates noncompressible left lower extremity arteries.The left toe-brachial index is normal. Waveform analysis demonstrates no evidence of significant peripheral arterial disease in the left lower extremity.  *See table(s) above for measurements  and observations.  Electronically signed by Kathlyn Sacramento MD on 05/06/2018 at 12:46:21 PM.    Final    Subjective: Headache resolved, slept well, had some left leg pain that was transient and has been ongoing for weeks, getting worked up as outpatient. Right hand developed some splotchy redness that he does not feel, meaning  there is no itching, pain, irritation, etc. Has gout but no joint pains.   Discharge Exam: Vitals:   05/27/18 0028 05/27/18 0528  BP: (!) 185/100 115/71  Pulse: 80 65  Resp: 20 18  Temp: 98.2 F (36.8 C) 98.6 F (37 C)  SpO2: 93% 95%   General: Pt is alert, awake, not in acute distress HEENT: Right pupil irregular, larger than left, reactive, full EOM ROM. Right parietal ecchymosis, mildly tender, no laceration. Cardiovascular: RRR, S1/S2 +, no rubs, no gallops Respiratory: CTA bilaterally, no wheezing, no rhonchi Abdominal: Soft, NT, ND, bowel sounds + Extremities: Right dorsal hand with irregular splotchy macular erythema, nontender with indistinct borders extending up fingers to DIP and not past wrist. Radial pulse 2+ bilaterally, cap refill brisk, joint ROM unchanged from usual, bony hypertrophy noted, unchanged. Neuro: Alert, oriented, HOH (baseline), pupils as above, otherwise no focal deficits.  Labs: Basic Metabolic Panel: Recent Labs  Lab 05/26/18 2022  NA 139  K 3.7  CL 103  CO2 23  GLUCOSE 228*  BUN 15  CREATININE 1.27*  CALCIUM 9.1   CBC: Recent Labs  Lab 05/26/18 2022  WBC 7.2  NEUTROABS 3.8  HGB 13.9  HCT 44.3  MCV 95.3  PLT 212   Time coordinating discharge: Approximately 40 minutes  Patrecia Pour, MD  [facility] 05/27/2018, 9:44 AM Pager [phone]

## 2018-05-27 NOTE — Telephone Encounter (Signed)
Can you please advise of this message?  Thank you!

## 2018-05-27 NOTE — Telephone Encounter (Signed)
Okay to discontinue Plavix;no recent intervention

## 2018-05-30 NOTE — Telephone Encounter (Signed)
LEFT DETAILED MESSAGE ON BOTH VOICE AND CELL . MAY DISCONTINUE PLAVIX.  ANY QUESTION MAY CALL OFFICE BACK. REMOVED MEDICATION FROM PATIENT'S LIST

## 2018-06-09 ENCOUNTER — Other Ambulatory Visit: Payer: Self-pay | Admitting: Cardiovascular Disease

## 2018-08-08 ENCOUNTER — Ambulatory Visit: Payer: Medicare Other | Admitting: Cardiovascular Disease

## 2018-08-08 ENCOUNTER — Encounter: Payer: Self-pay | Admitting: Cardiovascular Disease

## 2018-08-08 ENCOUNTER — Encounter (INDEPENDENT_AMBULATORY_CARE_PROVIDER_SITE_OTHER): Payer: Self-pay

## 2018-08-08 VITALS — BP 142/64 | HR 79 | Ht 66.0 in | Wt 205.2 lb

## 2018-08-08 DIAGNOSIS — I10 Essential (primary) hypertension: Secondary | ICD-10-CM | POA: Diagnosis not present

## 2018-08-08 DIAGNOSIS — I2510 Atherosclerotic heart disease of native coronary artery without angina pectoris: Secondary | ICD-10-CM | POA: Diagnosis not present

## 2018-08-08 DIAGNOSIS — M109 Gout, unspecified: Secondary | ICD-10-CM

## 2018-08-08 DIAGNOSIS — E118 Type 2 diabetes mellitus with unspecified complications: Secondary | ICD-10-CM

## 2018-08-08 DIAGNOSIS — E785 Hyperlipidemia, unspecified: Secondary | ICD-10-CM | POA: Diagnosis not present

## 2018-08-08 DIAGNOSIS — Z951 Presence of aortocoronary bypass graft: Secondary | ICD-10-CM

## 2018-08-08 DIAGNOSIS — M79662 Pain in left lower leg: Secondary | ICD-10-CM

## 2018-08-08 DIAGNOSIS — M79661 Pain in right lower leg: Secondary | ICD-10-CM

## 2018-08-08 MED ORDER — SPIRONOLACTONE 25 MG PO TABS: 25.0000 mg | ORAL_TABLET | Freq: Every day | ORAL | 1 refills | Status: DC

## 2018-08-08 NOTE — Progress Notes (Signed)
Patient ID: Peter Mullen, male   DOB: 12-Nov-1931, 85 y.o.   MRN: [id number]    Primary M.D.: Dr. Delfino Lovett Tisoivic  HPI: Peter Mullen is a 85 y.o. male who presents to the office today for an 6 month follow-up cardiology evaluation.    [name] has established CAD and in March 2001 underwent CABG revascularization surgery with a LIMA to the LAD, SVG to diagonal, SVG to the PLA, and SVG to the PDA vessel. Additional medical problems include hypertension, type 2 diabetes mellitus, mild obstructive sleep apnea not on CPAP therapy and obesity.   In October 2012 an nuclear perfusion study showed normal perfusion with a post stress ejection fraction of 62%. He has documented grade 1 diastolic dysfunction EA ratio 0.71.   A 2-D echo irevealed mild aortic sclerosis without stenosis, trace MR and trace TR. He has a history of mixed hyperlipidemia. He sees Dr. [name] for his primary medical care. Laboratory showed a normal chemistry profile, cholesterol 93, triglycerides, 110 HDL 31, and LDL 40 on his current medical regimen.  A two-year followup nuclear perfusion study on 06/30/2013 was low risk and demonstrated apical thinning with increased attenuation on resting images without significant ischemia. He did have mild LV dysfunction with an ejection fraction of 47% and focal apical inferior hypocontractility.    On 09/20/2013 a follow-up echo Doppler study showed an ejection fraction of 55% without diagnostic regional wall motion abnormalities although septal bounce was noted. He had grade 1 diastolic dysfunction. He had moderate calcification of his aortic valve without stenosis and there was moderate calcification of the mitral annulus with mildly calcified leaflets without significant regurgitation. His left atrium was mildly dilated. He had normal pulmonary pressures.  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  At that time, he had been on Toprol 100 mg daily and I  reduced this to 50 mg per day.  He has continued to take losartan 100 mg, HCTZ in addition to terazocin for hypertension.   In 2017 he had broken 2 of his molars and has been evaluated at the dental clinic at [facility].  He has been on chronic Plavix.  He had noticed that his blood pressure has been consistently elevated despite taking his vacations which include HCTZ 25 mg, losartan 100 mg, Toprol-XL 25 mg, and Hytrin 5 mg daily.  I added amlodipine 5 mg to his medical regimen for improved blood pressure control.  He had held his Plavix for dental work on his molars.   He is remaining active and typically bowls 2 times per week.  He has been having occasional pain down his left leg which may be attributable to spinal stenosis and in the past he had seen Dr. [name] for this, which had limited his walking.  He will need to have a molar extraction as well as an impacted was some tooth removed at the [facility], which is scheduled for later this month.  He underwent a nuclear perfusion study on 11/03/2016 in follow-up of his remote CABG surgery and this continued to be low risk and showed only a very small prior basal/mid inferoseptal defect without associated ischemia.  Ejection fraction was 55% and he had normal wall motion.    He underwent a nuclear perfusion study in April 2018, which remained low risk and showed a small defect in the basal inferoseptal and mid inferoseptal region consistent with probable scar without associated ischemia.  EF was 55% with normal wall motion.  Laboratory in June 2018 which showed total cholesterol 122, HDL 38, LDL 39, but triglycerides were elevated at 224.  Hemoglobin A1c was 6.2.  TSH was 3.25.     When I last saw him in July 2019 he remained stable and was without chest pain.  He  admitted to some exertional fatigue.  He was  found to have an elevated uric acid level and was just started on allopurinol.  He has been evaluated by Dr. [name] for left leg discomfort with  some left ankle swelling.  He denies any increasing shortness of breath or significant palpitations.    Since I last saw him he has continued to do well.  He still experiences occasional leg pain but this has improved.  He had undergone lower extremity Doppler assessment on May 06, 2018 which showed noncompressible ABIs bilaterally but he had normal toe brachial index bilaterally.  He has not had any acute gout flares.  He had recently fallen while bowling and banged his head.  Hospitalized overnight and was found to have a small subdural hematoma.  He was assessed by Dr. [name] at that time recommended holding Plavix for at least 2 weeks and okay to continue aspirin.  He has been off Plavix since his head trauma May 26, 2018.  Presently he feels well.  He denies PND orthopnea.  He is not having anginal symptoms.  He believes he is sleeping well.  He presents for evaluation.  Past medical history: CAD, status post CABG, hypertension, type 2 diabetes mellitus, mixed hyperlipidemia, obstructive sleep apnea, mild. He is status post removal of a a basal cell cancer removed from his left cheek by Dr. Sarajane Jews.    Past surgical history notable for CABG surgery, cataract surgery 2005 prostate surgery 1989.  Current Outpatient Medications  Medication Sig Dispense Refill  . allopurinol (ZYLOPRIM) 300 MG tablet Take 300 mg by mouth daily.  5  . aspirin 81 MG tablet Take 81 mg by mouth every evening.     . cetirizine (ZYRTEC) 10 MG tablet Take 10 mg by mouth daily.    [name] glucosamine-chondroitin 500-400 MG tablet Take 1 tablet by mouth 2 (two) times daily.    [name] LIPITOR 40 MG tablet TAKE 1/2 (ONE-HALF) TABLET BY MOUTH ONCE DAILY (Patient taking differently: Take 20 mg by mouth daily at 6 PM. ) 45 tablet 3  . metFORMIN (GLUCOPHAGE) 500 MG tablet Take 500 mg by mouth 2 (two) times daily with a meal.     . metoprolol succinate (TOPROL-XL) 50 MG 24 hr tablet Take 1 tablet (50 mg total) by mouth daily. 90  tablet 3  . montelukast (SINGULAIR) 10 MG tablet Take 10 mg by mouth at bedtime.     [name] olmesartan (BENICAR) 40 MG tablet TAKE 1 TABLET BY MOUTH ONCE DAILY (Patient taking differently: Take 40 mg by mouth daily. ) 90 tablet 3  . omeprazole (PRILOSEC) 20 MG capsule Take 20 mg by mouth 2 (two) times daily before a meal.    . spironolactone (ALDACTONE) 25 MG tablet Take 1 tablet (25 mg total) by mouth daily. 90 tablet 1  . terazosin (HYTRIN) 5 MG capsule Take 5 mg by mouth at bedtime.     [name] testosterone (ANDROGEL) 50 MG/5GM GEL Place 5 g onto the skin daily.    . nitroGLYCERIN (NITROSTAT) 0.4 MG SL tablet Place 1 tablet (0.4 mg total) under the tongue every 5 (five) minutes as needed for chest pain. 25 tablet 11   No  current facility-administered medications for this visit.     He is widowed per history children 4 grandchildren. There is no tobacco use. He does drink occasional alcohol.  ROS General: Negative; No fevers, chills, or night sweats;  HEENT: Positive for cracks in his molars; No changes in vision or hearing, sinus congestion, difficulty swallowing Pulmonary: Negative; No cough, wheezing, shortness of breath, hemoptysis Cardiovascular: Negative; No chest pain, presyncope, syncope, palpitations GI: Negative; No nausea, vomiting, diarrhea, or abdominal pain GU: Negative; No dysuria, hematuria, or difficulty voiding Musculoskeletal: Positive for back pain with leg discomfort secondary to spinal stenosis; intermittent left leg discomfort being evaluated by Dr. Lindwood Qua Rheumatologic: Gout Hematologic/Oncology: Negative; no easy bruising, bleeding Endocrine: Negative; no heat/cold intolerance; no diabetes Neuro: Negative; no changes in balance, headaches Skin: history of removal of prior basal cell carcinoma with clear margins; No rashes or skin lesions Psychiatric: Negative; No behavioral problems, depression Sleep: mild sleep apnea, not on CPAP therapy; No  daytime sleepiness,  hypersomnolence, bruxism, restless legs, hypnogognic hallucinations, no cataplexy Other comprehensive 14 point system review is negative.   PE BP (!) 142/64   Pulse 79   Ht '5\' 6"'  (1.676 m)   Wt 205 lb 3.2 oz (93.1 kg)   BMI 33.12 kg/m    Repeat blood pressure by me was 130/70  Wt Readings from Last 3 Encounters:  08/08/18 205 lb 3.2 oz (93.1 kg)  05/27/18 206 lb 5.6 oz (93.6 kg)  04/26/18 206 lb 12.8 oz (93.8 kg)   General: Alert, oriented, no distress.  Skin: normal turgor, no rashes, warm and dry HEENT: Normocephalic, atraumatic. Pupils equal round and reactive to light; sclera anicteric; extraocular muscles intact;  Nose without nasal septal hypertrophy Mouth/Parynx benign; Mallinpatti scale 3 Neck: No JVD, no carotid bruits; normal carotid upstroke Lungs: clear to ausculatation and percussion; no wheezing or rales Chest wall: without tenderness to palpitation Heart: PMI not displaced, RRR, s1 s2 normal, 1/6 systolic murmur, no diastolic murmur, no rubs, gallops, thrills, or heaves Abdomen: Moderate diastases recti; soft, nontender; no hepatosplenomehaly, BS+; abdominal aorta nontender and not dilated by palpation. Back: no CVA tenderness Pulses 2+ Musculoskeletal: full range of motion, normal strength, no joint deformities Extremities: no clubbing cyanosis or edema, Homan's sign negative  Neurologic: grossly nonfocal; Cranial nerves grossly wnl Psychologic: Normal mood and affect   ECG (independently read by me): Sinus rhythm at 79 bpm with first-degree AV block with appeared normal at 248 ms.  Left bundle branch block and left axis deviation.  July 2019 ECG (independently read by me): Sinus rhythm with first-degree AV block, frequent PVCs with transient bigeminy.  QTc interval 451 ms.  PR interval 242 ms.  November 2018 ECG (independently read by me): Difficult to discern but probable sinus rhythm with PACs  October 2017 ECG (independently read by me): Normal sinus  rhythm with first-degree AV block.  PAC.  Left bundle branch block.  July 2017 ECG (independently read by me): Sinus rhythm at 64 bpm.  PACs, nonspecific interventricular block.  First-degree AV block.  April 2017 ECG (independently read by me): Normal sinus rhythm at 64 bpm with occasional PVC and sinus arrhythmia.  First-degree AV block with a PR interval at 278 ms.  Blocked APC.  January 2017 ECG (independently read by me):  Sinus rhythm marked first-degree block.there also was a blocked APC.   He was a suggestion of possible 2nd degree Wenkebach block.  July 2016 ECG (independently read by me): Sinus rhythm with first-degree AV  block with a PR interval at 248 ms.  May 2016 ECG (independently read by me): Probable 2nd degree Wenkebach block  November 2015 ECG (independently read by me): normal sinus rhythm with first-degree AV block with a PR interval at 230 milliseconds.  March 2015 ECG: (independently read by me) sinus rhythm at 73 beats per minute. First degree block with PR interval 248 ms. Isolated unifocal PVCs.   LABS:  BMP Latest Ref Rng & Units 05/26/2018 02/24/2016 11/25/2015  Glucose 70 - 99 mg/dL 228(H) 139(H) 117(H)  BUN 8 - 23 mg/dL '15 14 20  ' Creatinine 0.61 - 1.24 mg/dL 1.27(H) 1.14(H) 1.11  Sodium 135 - 145 mmol/L 139 141 139  Potassium 3.5 - 5.1 mmol/L 3.7 3.9 4.5  Chloride 98 - 111 mmol/L 103 104 99  CO2 22 - 32 mmol/L '23 23 27  ' Calcium 8.9 - 10.3 mg/dL 9.1 9.0 9.7   Hepatic Function Latest Ref Rng & Units 11/25/2015 10/28/2013  Total Protein 6.1 - 8.1 g/dL 6.6 7.5  Albumin 3.6 - 5.1 g/dL 4.2 3.9  AST 10 - 35 U/L 29 35  ALT 9 - 46 U/L 26 26  Alk Phosphatase 40 - 115 U/L 59 30(L)  Total Bilirubin 0.2 - 1.2 mg/dL 0.6 0.5   CBC Latest Ref Rng & Units 05/26/2018 11/25/2015 10/28/2013  WBC 4.0 - 10.5 K/uL 7.2 7.5 5.8  Hemoglobin 13.0 - 17.0 g/dL 13.9 14.6 15.2  Hematocrit 39.0 - 52.0 % 44.3 44.6 44.6  Platelets 150 - 400 K/uL 212 227 230   Lab Results  Component Value  Date   TSH 3.25 11/25/2015    Lab Results  Component Value Date   HGBA1C 6.8 (H) 11/25/2015    Lipid Panel     Component Value Date/Time   CHOL 101 (L) 11/25/2015 1343   TRIG 170 (H) 11/25/2015 1343   HDL 42 11/25/2015 1343   CHOLHDL 2.4 11/25/2015 1343   VLDL 34 (H) 11/25/2015 1343   LDLCALC 25 11/25/2015 1343     RADIOLOGY: No results found.  IMPRESSION:  1. Essential hypertension   2. CAD in native artery   3. Hx of CABG   4. Hyperlipidemia with target LDL less than 70   5. Pain in both lower legs   6. Type 2 diabetes mellitus with complication, without long-term current use of insulin (HCC)   7. Gout, unspecified cause, unspecified chronicity, unspecified site    ASSESSMENT AND PLAN:  [name] is an 85 year old gentleman who is status post CABG revascularization surgery in 2001. At that time, a preoperative nuclear perfusion study was done prior to him undergoing potential donod bone marrow transplantation for his brother which was abnormal and led to his catheterization and ultimate detection of significant multivessel CAD. He was asymptomatic without chest pain.  He has a history of hypertension and  has been demonstrated to have transient had second-degree AV block, type I.  His blood pressure today is stable on terazosin 5 mg, spironolactone 12.5 mg, olmesartan 40 mg, Toprol-XL 50 mg, and HCTZ 25 mg.  With his history of gout, I have suggested that he discontinue HCTZ due to potential increased uric acid risk and have suggested further slight titration of spironolactone up to 25 mg daily.  He is not having any anginal symptoms.  I reviewed his October hospitalization cessation when he presented after falling on the bowling alley, hit his head and developed a small subdural hematoma.  At that time he was assessed by Dr. Charmian Muff.  He has been off Plavix therapy since that time.  Presently I recommended that he stay off Plavix indefinitely since he has not had any recent  stenting and his CABG surgery was many years ago.  I reviewed his lower extremity Doppler study which was done in October 2019 and had been ordered by Rosaria Ferries.  This was done for pain in both legs.  Ankle brachial indices indicated noncompressible arteries.  The toe brachial indices were both normal bilaterally.  He continues to be on atorvastatin 40 mg for hyperlipidemia.  Target LDL is less than 70.  Dr. Osborne Casco has been checking his laboratory.  Remotely I had recommended consideration for of a Sepra due to elevated triglycerides and had discussed with him the "reduce it trial "data.  He will be following up with Dr. Osborne Casco.  He is diabetic on metformin.  As long as he remains stable I will see him in 6 months for reevaluation.   Time spent: 25 minutes  Troy Sine, MD, [facility]  08/09/2018 2:09 PM

## 2018-08-08 NOTE — Patient Instructions (Signed)
Medication Instructions:  Stop HCTZ Increase Spironolactone to 25 mg daily.  If you need a refill on your cardiac medications before your next appointment, please call your pharmacy.    Follow-Up: At [facility], you and your health needs are our priority.  As part of our continuing mission to provide you with exceptional heart care, we have created designated Provider Care Teams.  These Care Teams include your primary Cardiologist (physician) and Advanced Practice Providers (APPs -  Physician Assistants and Nurse Practitioners) who all work together to provide you with the care you need, when you need it. You will need a follow up appointment in 6 months.  Please call our office 2 months in advance to schedule this appointment.  You may see Shelva Majestic, MD or one of the following Advanced Practice Providers on your designated Care Team: [place], [place] . Fabian Sharp, PA-C

## 2018-08-09 ENCOUNTER — Encounter: Payer: Self-pay | Admitting: Cardiovascular Disease

## 2018-09-15 ENCOUNTER — Other Ambulatory Visit: Payer: Self-pay | Admitting: Orthopedic Surgery

## 2018-09-15 DIAGNOSIS — M545 Low back pain: Principal | ICD-10-CM

## 2018-09-15 DIAGNOSIS — G8929 Other chronic pain: Secondary | ICD-10-CM

## 2018-09-16 ENCOUNTER — Telehealth: Payer: Self-pay | Admitting: Nurse Practitioner

## 2018-09-16 NOTE — Telephone Encounter (Signed)
Phone call to patient to verify medication list and allergies for myelogram procedure. Pt aware he will not need to hold any medications for this procedure.  

## 2018-09-30 ENCOUNTER — Ambulatory Visit
Admission: RE | Admit: 2018-09-30 | Discharge: 2018-09-30 | Disposition: A | Discharge status: Home / Self Care | Payer: Medicare Other | Source: Ambulatory Visit | Attending: Orthopedic Surgery | Admitting: Orthopedic Surgery

## 2018-09-30 DIAGNOSIS — G8929 Other chronic pain: Secondary | ICD-10-CM

## 2018-09-30 DIAGNOSIS — M545 Low back pain, unspecified: Secondary | ICD-10-CM

## 2018-09-30 IMAGING — XA DG MYELOGRAPHY LUMBAR INJ LUMBOSACRAL
7 of 16 series · 7 of 16 positions shown · non-contrast
Comparison: None

CLINICAL DATA: Back pain
TECHNIQUE: Contiguous axial images were obtained through the Lumbar spine after
the intrathecal infusion of infusion. Coronal and sagittal
reconstructions were obtained of the axial image sets.

[Series 1: w lumbar spine lat · 0.15mm/px · 1 of 1 slices shown]
[im 1/1]
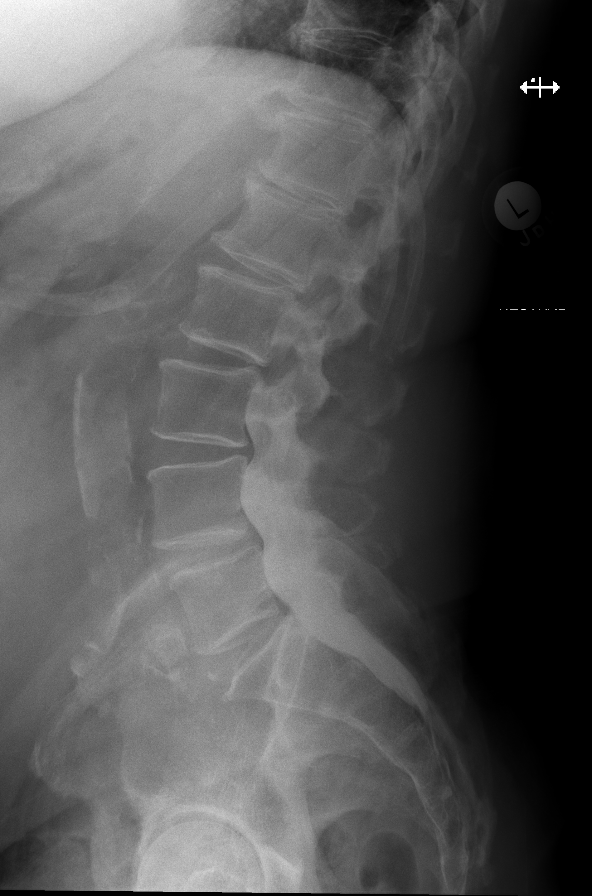

[Series 1: vasc adipose · 1 of 1 slices shown (1 of 4)]
[im 1/1]
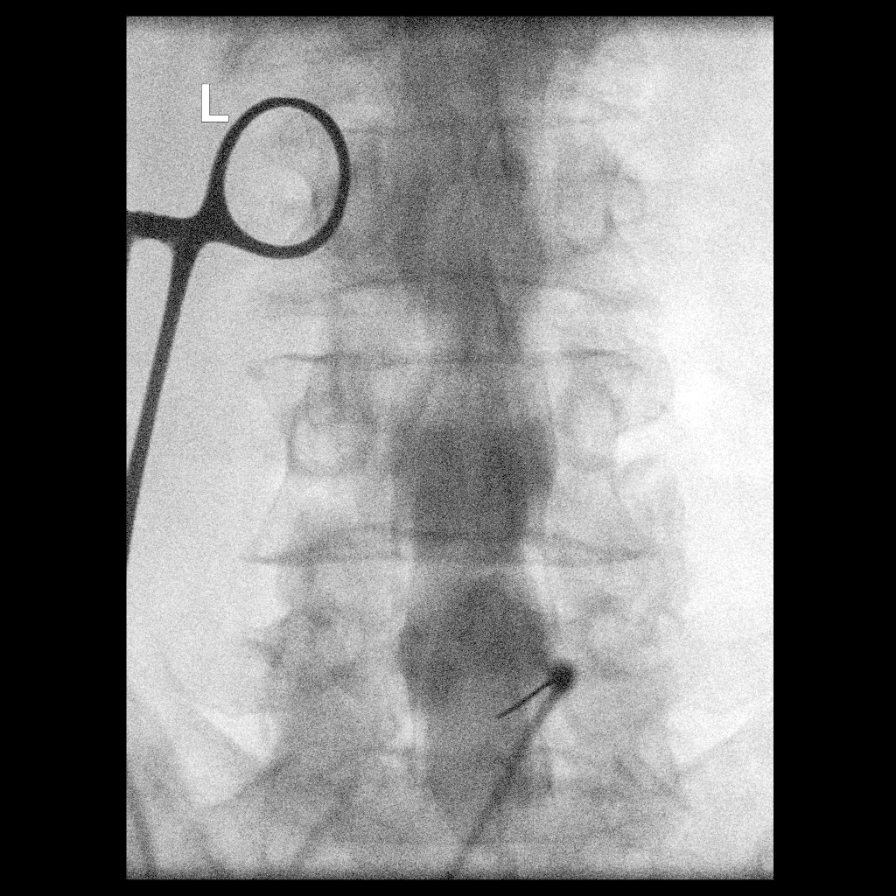

[Series 2: w lumbar spine flexion · 0.15mm/px · 1 of 1 slices shown]
[im 1/1]
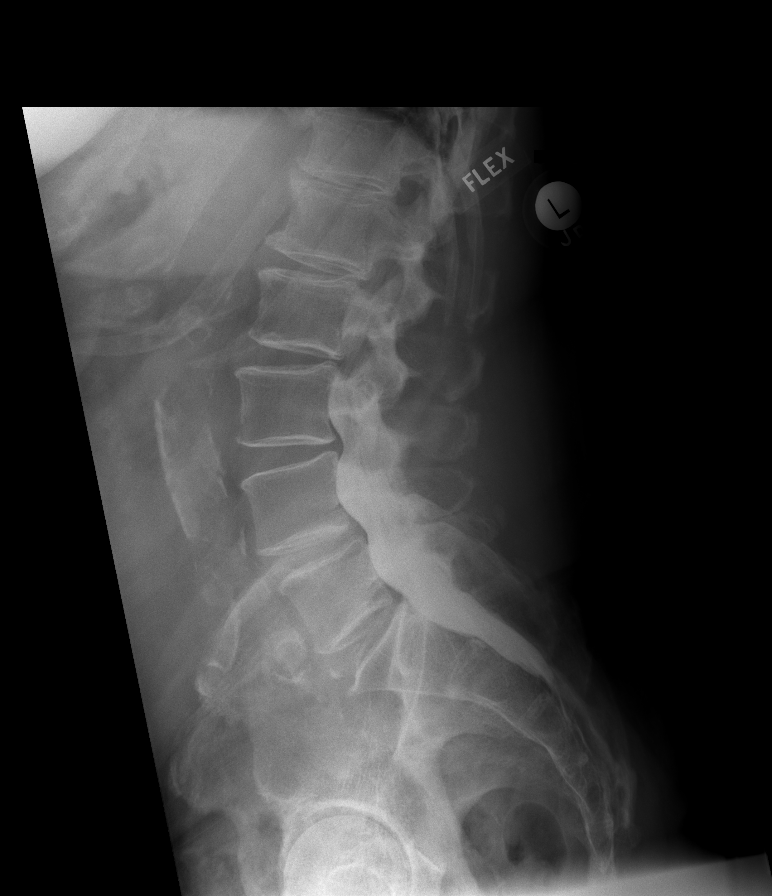

[Series 2: vasc adipose · 1 of 1 slices shown (2 of 4)]
[im 1/1]
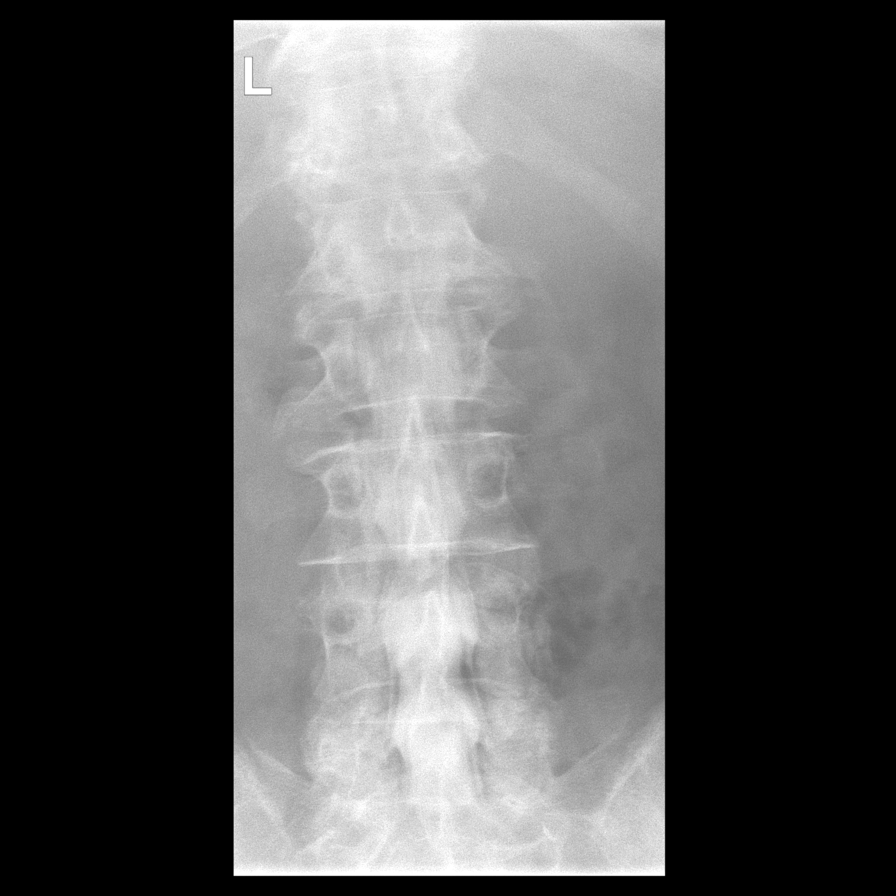

[Series 3: w lumbar spine extension · 0.15mm/px · 1 of 1 slices shown]
[im 1/1]
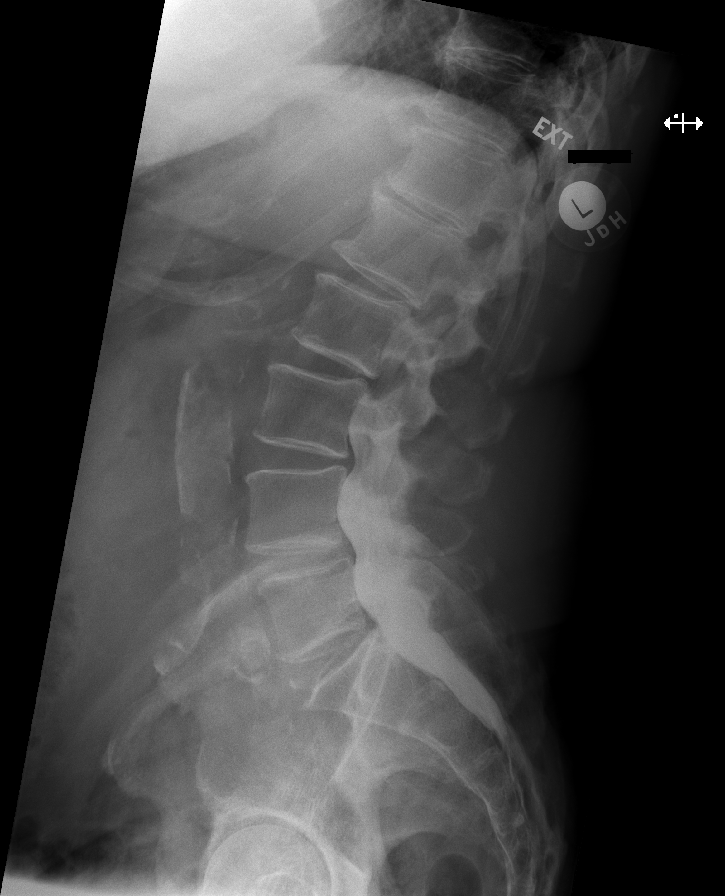

[Series 3: vasc adipose · 1 of 1 slices shown (3 of 4)]
[im 1/1]
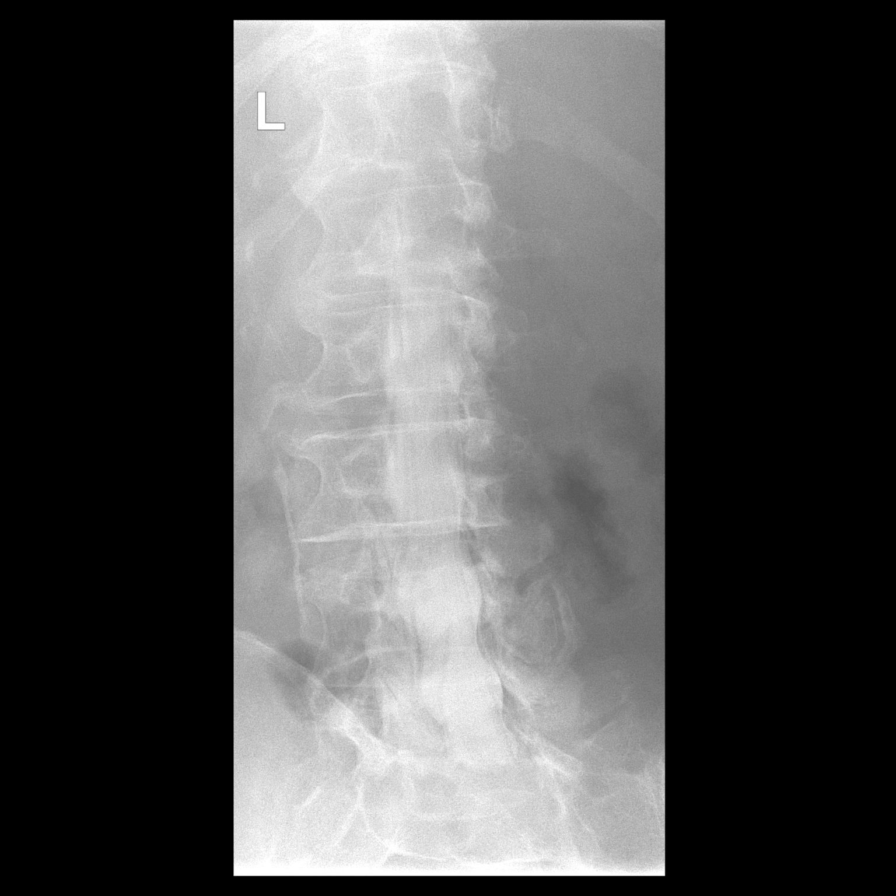

[Series 4: vasc adipose · 1 of 1 slices shown (4 of 4)]
[im 1/1]
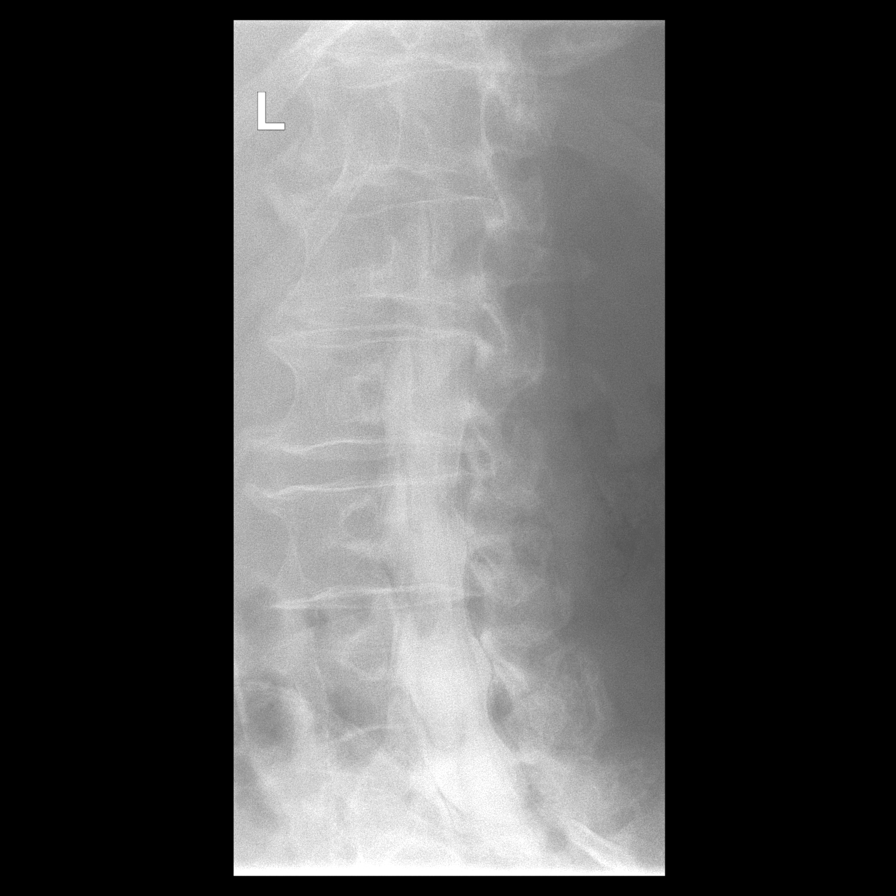

[7 of 16 positions shown; findings below may reference images not displayed]

EXAM:
LUMBAR MYELOGRAM

FLUOROSCOPY TIME:  21 seconds.  13.9 mGy.

PROCEDURE:
After thorough discussion of risks and benefits of the procedure
including bleeding, infection, injury to nerves, blood vessels,
adjacent structures as well as headache and CSF leak, written and
oral informed consent was obtained. Consent was obtained by Dr.
AUJLA. Time out form was completed.

Patient was positioned prone on the fluoroscopy table. Local
anesthesia was provided with 1% lidocaine without epinephrine after
prepped and draped in the usual sterile fashion. Puncture was
performed at right L5-S1 using a 3 1/2 inch 22-gauge spinal needle
via paramedian approach. Using a single pass through the dura, the
needle was placed within the thecal sac, with return of clear CSF.
15 mL of Isovue [IJ] was injected into the thecal sac, with normal
opacification of the nerve roots and cauda equina consistent with
free flow within the subarachnoid space.

I personally performed the lumbar puncture and administered the
intrathecal contrast. I also personally supervised acquisition of
the myelogram images.
FINDINGS: Anatomic alignment. No vertebral compression. No obvious spinal
stenosis. Mild disc bulge and lateral recess narrowing at L3-4.
Flexion and extension views demonstrate limited range of motion but
no abnormal motion to suggest instability.
IMPRESSION: Lumbar myelogram in preparation for CT.

## 2018-09-30 IMAGING — CT CT L SPINE W/ CM
1 of 6 series · 6 of 14 positions shown, 8 images · IV contrast (isovue)
Comparison: None.

CLINICAL DATA: Back pain

EXAM:
CT MYELOGRAPHY LUMBAR SPINE
TECHNIQUE: CT imaging of the lumbar spine was performed after Isovue 200M
contrast administration. Multiplanar CT image reconstructions were
also generated.

[Series 3: l spine soft · axial · 0.36mm/px · z∈[-226,-43]mm · 6 of 87 slices shown, 8 images]
[im 13/87  soft-tissue]
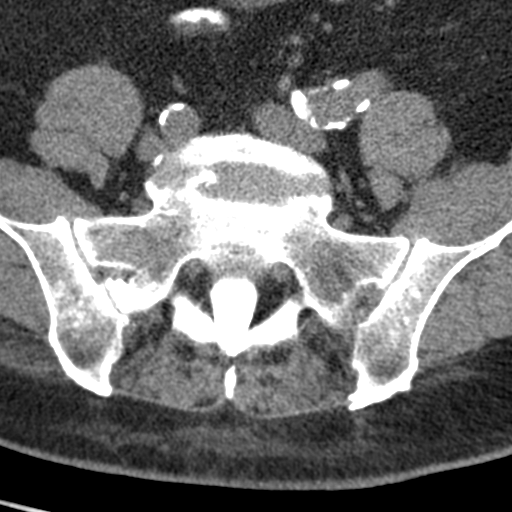
[im 13/87  bone]
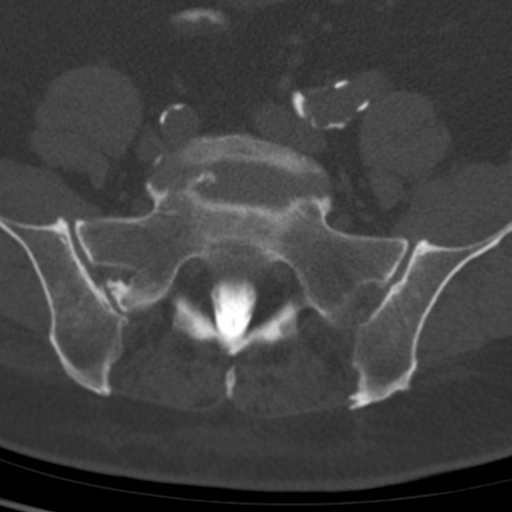
[im 25/87  bone]
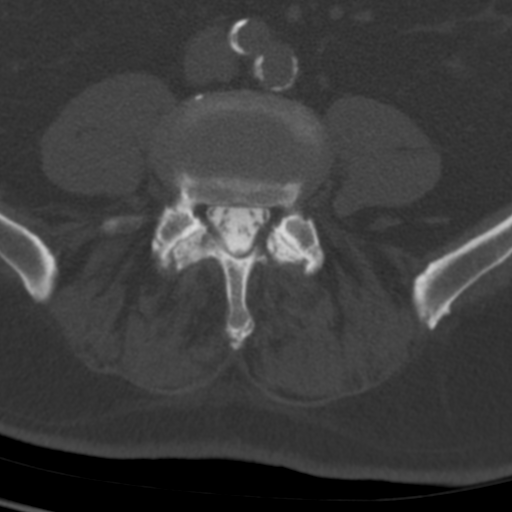
[im 37/87  bone]
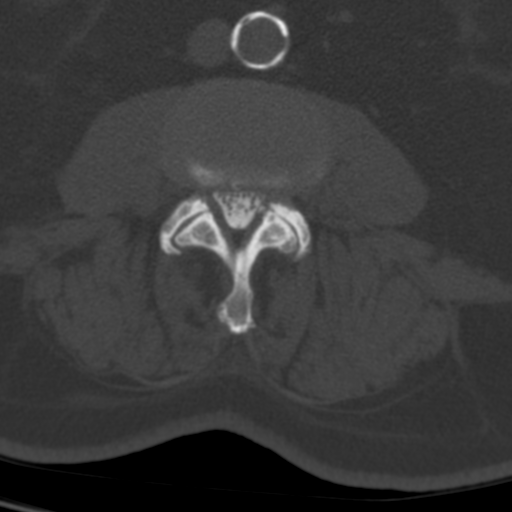
[im 50/87  bone]
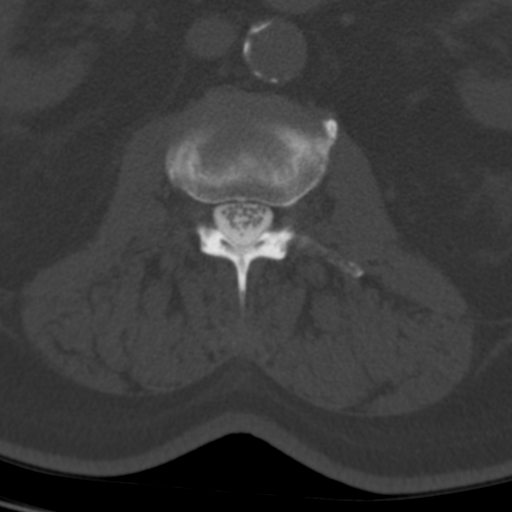
[im 62/87  soft-tissue]
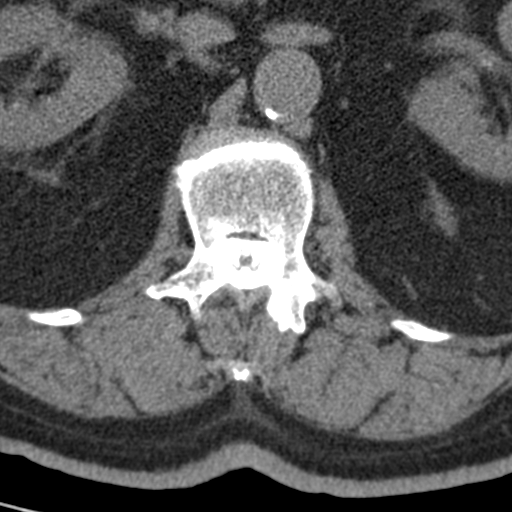
[im 62/87  bone]
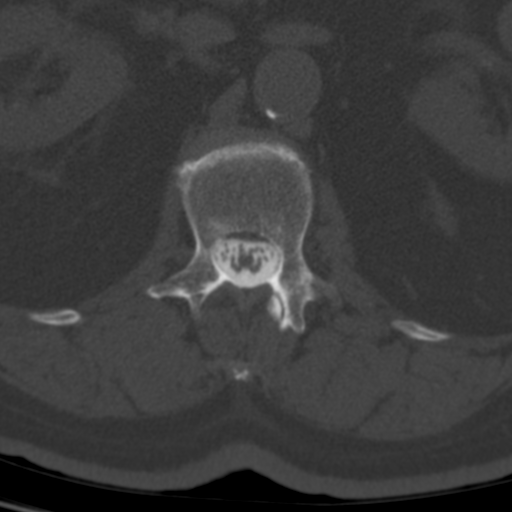
[im 74/87  bone]
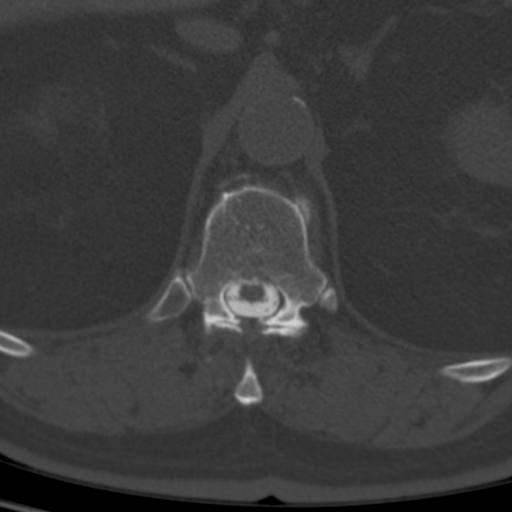

[6 of 14 positions shown; findings below may reference images not displayed]

FINDINGS: Radiologist injection

Segmentation:  5 lumbar type vertebral bodies.

Alignment: Slight anterolisthesis L4 upon L5.

Vertebrae: There is no vertebral compression deformity.

Conus medullaris: Extends to the L1-2 disc level and appears normal.

Paraspinal and other soft tissues: Aortic atherosclerotic
calcifications.

Disc levels:

T12-L1: Mild posterior disc osteophytes and mild disc space
narrowing. No evidence of impingement.

L1-2: Minimal posterior disc osteophyte formation and disc
narrowing. No evidence of impingement.

L2-3: Mild concentric disc bulge mild ligamentum flavum hypertrophy.
No spinal stenosis or foraminal stenosis.

L3-4: Mild concentric disc bulge. Mild facet arthropathy. Moderate
right foraminal narrowing. Severe left foraminal narrowing. This is
multifactorial. No central stenosis. Mild lateral recess narrowing
left worse than right.

L4-5: There is minimal uncovering of the disc. Moderate bilateral
facet arthropathy. Mild lateral recess narrowing right greater than
left. Mild foraminal narrowing. No central stenosis.

L5-S1: Disc height is preserved. Very shallow central disc
protrusion. Moderate facet arthropathy bilaterally. Central canal
and lateral recesses are patent. Foramina are widely patent.
IMPRESSION: Mild degenerative disc disease at T12-L1, L1-2, and L2-3 without
impingement

Bilateral foraminal narrowing and lateral recess narrowing at L3-4
as described. Foraminal narrowing is severe on the left at this
level.

Mild lateral recess narrowing and foraminal narrowing bilaterally at
L4-5 with uncovering of the disc

Very shallow disc protrusion at L5-S1 without consequence.

Bilateral facet arthropathy at L3-4, L4-5, and L5-S1.

## 2018-09-30 MED ORDER — ONDANSETRON HCL 4 MG/2ML IJ SOLN: 4.0000 mg | Freq: Four times a day (QID) | INTRAMUSCULAR | Status: DC | PRN

## 2018-09-30 MED ORDER — DIAZEPAM 5 MG PO TABS
5.0000 mg | ORAL_TABLET | Freq: Once | ORAL | Status: AC
  Administered 2018-09-30: 5 mg via ORAL

## 2018-09-30 MED ORDER — IOPAMIDOL (ISOVUE-M 200) INJECTION 41%
15.0000 mL | Freq: Once | INTRAMUSCULAR | Status: AC
  Administered 2018-09-30: 15 mL via INTRATHECAL

## 2018-09-30 NOTE — Discharge Instructions (Signed)

## 2018-10-26 ENCOUNTER — Other Ambulatory Visit: Payer: Self-pay | Admitting: Cardiovascular Disease

## 2018-10-26 NOTE — Telephone Encounter (Signed)
Benicar refill.

## 2018-10-26 NOTE — Telephone Encounter (Signed)
While refilling patient's Benicar I noticed he had 20 mg and 40 mg RX. The 20 mg (2 QD) RX was deactivated. 40 mg tablet QD was verbally given to pharmacist. She states it was previously changed due to the 40 mg tablets being on back order.

## 2018-11-16 ENCOUNTER — Telehealth: Payer: Self-pay | Admitting: Cardiovascular Disease

## 2018-11-16 DIAGNOSIS — I10 Essential (primary) hypertension: Secondary | ICD-10-CM

## 2018-11-16 NOTE — Telephone Encounter (Signed)
Spoke with pt, he has noticed elevated bp 150 to 175 and also low heart rates of 40's. His watch reports and episode last night of heart rate 38 bpm between 3 and 3:30 am. His watch also reports average resting heart rate of 41, walking 72 and right now it was 66. Also for the last 1-2 weeks he develops swelling in his feet about 1-2 hours after getting up in the morning. He denies SOB or dizziness. The only other thing going on is back and mid abdominal pain occ that he feels is related to spastic colon which he has had before. He does not feel faint. He has been taking musonex dm every 12 hours for the last couple days and allopurinol for gout. Will forward to dr Claiborne Billings to review and advise.

## 2018-11-16 NOTE — Telephone Encounter (Signed)
With his bradycardia, decrease Toprol from 50 mg down to 25 mg daily.  Since he is having increasing swelling can try to increase spironolactone and take 25 mg in the morning and 12.5 mg; recommend follow-up b met

## 2018-11-16 NOTE — Telephone Encounter (Signed)
° ° °  STAT if HR is under 50 or over 120 (normal HR is 60-100 beats per minute)  1) What is your heart rate? 47  2) Do you have a log of your heart rate readings (document readings)?  40-43  3) Do you have any other symptoms? BP 165/85, swelling in ankles   1) How much weight have you gained and in what time span? N/A  2) If swelling, where is the swelling located? ANKLES  3) Are you currently taking a fluid pill? NO  4) Are you currently SOB? NO  5) Do you have a log of your daily weights (if so, list)? 205 TODAY  6) Have you gained 3 pounds in a day or 5 pounds in a week? N/A  7) Have you traveled recently? NO

## 2018-11-17 MED ORDER — METOPROLOL SUCCINATE ER 25 MG PO TB24: 25.0000 mg | ORAL_TABLET | Freq: Every day | ORAL | 1 refills | Status: DC

## 2018-11-17 MED ORDER — SPIRONOLACTONE 25 MG PO TABS: ORAL_TABLET | ORAL | 1 refills | Status: DC

## 2018-11-17 NOTE — Telephone Encounter (Signed)
Called patient, he is aware of the instructions from MD for medication changes.  Meds sent to pharmacy, as well as lab ordered.  Patient is to call back if any issues or concerns.

## 2018-11-17 NOTE — Telephone Encounter (Signed)
Lab ordered.

## 2018-11-17 NOTE — Telephone Encounter (Signed)
Called patient to advise of message from MD, patient did not answer- LVM to call back to discuss med changes.

## 2018-11-17 NOTE — Telephone Encounter (Signed)
Follow up    Pt is returning call, please call back

## 2018-11-19 LAB — BASIC METABOLIC PANEL
BUN/Creatinine Ratio: 9 — ABNORMAL LOW (ref 10–24)
BUN: 10 mg/dL (ref 8–27)
CO2: 23 mmol/L (ref 20–29)
Calcium: 9.5 mg/dL (ref 8.6–10.2)
Chloride: 107 mmol/L — ABNORMAL HIGH (ref 96–106)
Creatinine, Ser: 1.07 mg/dL (ref 0.76–1.27)
GFR calc Af Amer: 73 mL/min/{1.73_m2} (ref >59)
GFR calc non Af Amer: 63 mL/min/{1.73_m2} (ref >59)
Glucose: 184 mg/dL — ABNORMAL HIGH (ref 65–99)
Potassium: 4.6 mmol/L (ref 3.5–5.2)
Sodium: 146 mmol/L — ABNORMAL HIGH (ref 134–144)

## 2018-11-24 ENCOUNTER — Telehealth: Payer: Self-pay | Admitting: Cardiovascular Disease

## 2018-11-24 MED ORDER — SPIRONOLACTONE 25 MG PO TABS: 25.0000 mg | ORAL_TABLET | Freq: Two times a day (BID) | ORAL | 3 refills | Status: DC

## 2018-11-24 NOTE — Telephone Encounter (Signed)
Called patient back about his message. Spironolactone changes helped a little with swelling, but still has BLE swelling in calves and ankles. Swelling goes down some at night. Patient takes colchicine 3 times a week and allopurinol daily. Metoprolol was decreased to 25 mg,  BP 128/84 this morning, and 159/83 this afternoon, HR 55-65 awake, and HR 42 sleeping. Patient has not gained 5 lbs in one week or 3 lbs in one day. Patient has lost 2 lbs in one day per his wt log.  Will forward to Dr. Claiborne Billings for further advise about BLE edema.

## 2018-11-24 NOTE — Telephone Encounter (Signed)
Called patient with Dr. Evette Georges recommendations. Patient verbalized understanding. Updated medication list.

## 2018-11-24 NOTE — Telephone Encounter (Signed)
New Message   Pt c/o swelling: STAT is pt has developed SOB within 24 hours  1) How much weight have you gained and in what time span? Steady, between 202-207  Today-203 Monday 04/27- 209   2) If swelling, where is the swelling located? Both Legs   3) Are you currently taking a fluid pill? No   4) Are you currently SOB? No   Do you have a log of your daily weights (if so, list)? Yes  Have you gained 3 pounds in a day or 5 pounds in a week? yes  5) Have you traveled recently? No   Pts BP around 124/80 - 159/83   Pt has not had his results from his lab work   Please call

## 2018-11-24 NOTE — Telephone Encounter (Signed)
Renal function is stable can increase spironolactone to 25 mg twice a day

## 2018-12-12 ENCOUNTER — Telehealth: Payer: Self-pay | Admitting: Cardiovascular Disease

## 2018-12-12 NOTE — Telephone Encounter (Signed)
Pt c/o swelling: STAT is pt has developed SOB within 24 hours  1) How much weight have you gained and in what time span? No weight ganed  2) If swelling, where is the swelling located? Feet, legs and ankles  3) Are you currently taking a fluid pill? no  4) Are you currently SOB? o  5) Do you have a log of your daily weights (if so, list)?yes  6) Have you gained 3 pounds in a day or 5 pounds in a week?  no  7) Have you traveled recently? no

## 2018-12-12 NOTE — Telephone Encounter (Signed)
ok 

## 2018-12-12 NOTE — Telephone Encounter (Signed)
I think he needs a virtual visit with the in-office [facility] tomorrow.   I will also message Dr. Claiborne Billings to advise. Sounds like he may need a repeat echo and labs.

## 2018-12-12 NOTE — Telephone Encounter (Signed)
Spoke with patient. He has appointment with MD on 5/22. He has no SOB, so OK to leave this appointment per PA. Explained virtual visit process to patient. He has an iphone and may have granddaughter come over to assist him day of appointment as well. He is to call back if he has any changes in his status between now and Friday.

## 2018-12-12 NOTE — Telephone Encounter (Signed)
Returned call to patient of Dr. Claiborne Billings. He reports bilateral LE edema. He reports swelling for several months but worse in the last week. Patient reports swelling in ankles/top of feet 2-3 hours after waking. R swelling > L swelling. He denies SOB and denies weight gain. He is compliant with medications. Patient monitors salt/sodium intake. He has compression stockings. Advised to wear those when able and rest with legs elevated. Notified him that I will send message to provider for recommendations and he will be notified accordingly. He voiced understanding.

## 2018-12-14 ENCOUNTER — Telehealth: Payer: Self-pay | Admitting: Cardiovascular Disease

## 2018-12-14 NOTE — Telephone Encounter (Signed)
Iphone/consent/ my chart via email/ pre reg completed

## 2018-12-15 ENCOUNTER — Telehealth: Payer: Self-pay | Admitting: Cardiovascular Disease

## 2018-12-15 NOTE — Telephone Encounter (Signed)
Patient called because when he takes a deep breath and holds it he gets some discomfort on the left side of his chest.   He does have a virtual visit tomorrow with Dr. Claiborne Billings.

## 2018-12-15 NOTE — Telephone Encounter (Signed)
Spoke with pt and notes chest discomfort but only occurs if takes deep breath and holds for second when is breathing normal is fine Pt also notes swelling and is wearing compression stockings Pt checked B/P while on phone and is 199/93 and HR 62 Also pt notes HR was 125 yesterday and while sleeping dropped to 37 according to apple watch.Per pt has been coughing productive cough yellow sputum no temp 96.8 this am Per pt has been taking mucinex for cough with relief Pt feels this is allergies Will forward to Dr Claiborne Billings for review Pt has appt tom with Dr Claiborne Billings .Adonis Housekeeper

## 2018-12-16 ENCOUNTER — Telehealth (INDEPENDENT_AMBULATORY_CARE_PROVIDER_SITE_OTHER): Payer: Medicare Other | Admitting: Cardiovascular Disease

## 2018-12-16 ENCOUNTER — Other Ambulatory Visit: Payer: Self-pay

## 2018-12-16 ENCOUNTER — Encounter: Payer: Self-pay | Admitting: Cardiovascular Disease

## 2018-12-16 VITALS — BP 167/91 | HR 52 | Temp 97.7°F | Ht 66.0 in | Wt 201.1 lb

## 2018-12-16 DIAGNOSIS — M109 Gout, unspecified: Secondary | ICD-10-CM

## 2018-12-16 DIAGNOSIS — I2510 Atherosclerotic heart disease of native coronary artery without angina pectoris: Secondary | ICD-10-CM | POA: Diagnosis not present

## 2018-12-16 DIAGNOSIS — E785 Hyperlipidemia, unspecified: Secondary | ICD-10-CM

## 2018-12-16 DIAGNOSIS — Z951 Presence of aortocoronary bypass graft: Secondary | ICD-10-CM

## 2018-12-16 DIAGNOSIS — I10 Essential (primary) hypertension: Secondary | ICD-10-CM

## 2018-12-16 DIAGNOSIS — E118 Type 2 diabetes mellitus with unspecified complications: Secondary | ICD-10-CM

## 2018-12-16 MED ORDER — FUROSEMIDE 20 MG PO TABS: 20.0000 mg | ORAL_TABLET | Freq: Every day | ORAL | 3 refills | Status: DC

## 2018-12-16 MED ORDER — METOPROLOL SUCCINATE ER 25 MG PO TB24: 12.5000 mg | ORAL_TABLET | Freq: Every day | ORAL | 1 refills | Status: DC

## 2018-12-16 NOTE — Telephone Encounter (Signed)
Appointment this morning with Dr.Kelly- can discuss at appointment.

## 2018-12-16 NOTE — Patient Instructions (Signed)
Medication Instructions:  Decrease Metoprolol XL to 0.5 tablet (12.5 mg) daily. Start Lasix 20 mg daily  If you need a refill on your cardiac medications before your next appointment, please call your pharmacy.   Lab work: BMET in 2-3 weeks. If you have labs (blood work) drawn today and your tests are completely normal, you will receive your results only by: [name] MyChart Message (if you have MyChart) OR . A paper copy in the mail If you have any lab test that is abnormal or we need to change your treatment, we will call you to review the results.  Follow-Up: At [facility], you and your health needs are our priority.  As part of our continuing mission to provide you with exceptional heart care, we have created designated Provider Care Teams.  These Care Teams include your primary Cardiologist (physician) and Advanced Practice Providers (APPs -  Physician Assistants and Nurse Practitioners) who all work together to provide you with the care you need, when you need it. You will need a follow up appointment in 2-3 months. You may see Shelva Majestic, MD or one of the following Advanced Practice Providers on your designated Care Team: [place], [place] . Fabian Sharp, PA-C

## 2018-12-16 NOTE — Progress Notes (Signed)
Virtual Visit via Video Note   This visit type was conducted due to national recommendations for restrictions regarding the COVID-19 Pandemic (e.g. social distancing) in an effort to limit this patient's exposure and mitigate transmission in our community.  Due to his co-morbid illnesses, this patient is at least at moderate risk for complications without adequate follow up.  This format is felt to be most appropriate for this patient at this time.  All issues noted in this document were discussed and addressed.  A limited physical exam was performed with this format.  Please refer to the patient's chart for his consent to telehealth for [facility].   Date:  12/16/2018   ID:  Peter Mullen, DOB 1932/07/18, MRN [id number]  Patient Location: Home Provider Location: Home  PCP:  Tisovec, Fransico Him, MD  Cardiologist:  Shelva Majestic, MD  Electrophysiologist:  None   Evaluation Performed:  Follow-Up Visit  Chief Complaint:  4 month F/U with chief complaint of lower extremity edema  History of Present Illness:    Peter Mullen is a 85 y.o. male who has established CAD and in March 2001 underwent CABG revascularization surgery with a LIMA to the LAD, SVG to diagonal, SVG to the PLA, and SVG to the PDA vessel. Additional medical problems include hypertension, type 2 diabetes mellitus, mild obstructive sleep apnea not on CPAP therapy and obesity.   In October 2012 an nuclear perfusion study showed normal perfusion with a post stress ejection fraction of 62%. He has documented grade 1 diastolic dysfunction EA ratio 0.71.   A 2-D echo irevealed mild aortic sclerosis without stenosis, trace MR and trace TR. He has a history of mixed hyperlipidemia. He sees Dr. [name] for his primary medical care. Laboratory showed a normal chemistry profile, cholesterol 93, triglycerides, 110 HDL 31, and LDL 40 on his current medical regimen.  A two-year followup nuclear perfusion study on 06/30/2013 was low  risk and demonstrated apical thinning with increased attenuation on resting images without significant ischemia. He did have mild LV dysfunction with an ejection fraction of 47% and focal apical inferior hypocontractility.    On 09/20/2013 a follow-up echo Doppler study showed an ejection fraction of 55% without diagnostic regional wall motion abnormalities although septal bounce was noted. He had grade 1 diastolic dysfunction. He had moderate calcification of his aortic valve without stenosis and there was moderate calcification of the mitral annulus with mildly calcified leaflets without significant regurgitation. His left atrium was mildly dilated. He had normal pulmonary pressures.  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  At that time, he had been on Toprol 100 mg daily and I reduced this to 50 mg per day.  He has continued to take losartan 100 mg, HCTZ in addition to terazocin for hypertension.   In 2017 he had broken 2 of his molars and has been evaluated at the dental clinic at [facility].  He has been on chronic Plavix.  He had noticed that his blood pressure has been consistently elevated despite taking his vacations which include HCTZ 25 mg, losartan 100 mg, Toprol-XL 25 mg, and Hytrin 5 mg daily.  I added amlodipine 5 mg to his medical regimen for improved blood pressure control.  He had held his Plavix for dental work on his molars.   He is remaining active and typically bowls 2 times per week.  He has been having occasional pain down his left leg which may be attributable to spinal stenosis  and in the past he had seen Dr. [name] for this, which had limited his walking.  He will need to have a molar extraction as well as an impacted was some tooth removed at the [facility], which is scheduled for later this month.  He underwent a nuclear perfusion study on 11/03/2016 in follow-up of his remote CABG surgery and this continued to be low risk and showed only a  very small prior basal/mid inferoseptal defect without associated ischemia.  Ejection fraction was 55% and he had normal wall motion.    He underwent a nuclear perfusion study in April 2018, which remained low risk and showed a small defect in the basal inferoseptal and mid inferoseptal region consistent with probable scar without associated ischemia.  EF was 55% with normal wall motion. Laboratory in June 2018 which showed total cholesterol 122, HDL 38, LDL 39, but triglycerides were elevated at 224.  Hemoglobin A1c was 6.2.  TSH was 3.25.     When I last saw him in July 2019 he remained stable and was without chest pain.  He  admitted to some exertional fatigue.  He was  found to have an elevated uric acid level and was just started on allopurinol.  He has been evaluated by Dr. [name] for left leg discomfort with some left ankle swelling.  He denies any increasing shortness of breath or significant palpitations.    Since I last saw him he has continued to do well.  He still experiences occasional leg pain but this has improved.  He had undergone lower extremity Doppler assessment on May 06, 2018 which showed noncompressible ABIs bilaterally but he had normal toe brachial index bilaterally.  He has not had any acute gout flares.  He had recently fallen while bowling and banged his head.  Hospitalized overnight and was found to have a small subdural hematoma.  He was assessed by Dr. [name] at that time recommended holding Plavix for at least 2 weeks and okay to continue aspirin.  He has been off Plavix since his head trauma May 26, 2018.    When I saw him in January 2020, he was feeling well.  He was without chest pain and denied PND orthopnea.  He was sleeping well.  With his history of gout, I recommended he discontinue HCTZ due to potential increased uric acid risk and suggested slight titration of Spironolactone up to 25 mg daily.  Over the past several months he has felt relatively well  but he has continued to have issues with leg swelling.  He did notice some improvement in the leg swelling with the increase spironolactone dose.  However, everyday by midday swelling again recurs.  It does improve with use of compression stockings.  Most recent laboratory had shown improvement in renal function with a creatinine of 1.07 on November 18, 2018.   The patient does not have symptoms concerning for COVID-19 infection (fever, chills, cough, or new shortness of breath).    Past Medical History:  Diagnosis Date   Coronary artery disease    Diabetes mellitus without complication ([place])    Hyperlipidemia    Hypertension    Skin cancer    Stomach problems    Past Surgical History:  Procedure Laterality Date   CORONARY ARTERY BYPASS GRAFT  2001   x4 LIMA-LAD, SVG-PDA   PROSTATE SURGERY  1992   TONSILLECTOMY  1940   TUMOR REMOVAL  1970   fatty tissue tumor   VASECTOMY  1975  Current Meds  Medication Sig   allopurinol (ZYLOPRIM) 300 MG tablet Take 300 mg by mouth daily.   aspirin 81 MG tablet Take 81 mg by mouth every evening.    cetirizine (ZYRTEC) 10 MG tablet Take 10 mg by mouth daily.   Cholecalciferol (VITAMIN D3) 50 MCG (2000 UT) capsule Take 2,000 Units by mouth daily.   colchicine 0.6 MG tablet Take 0.6 mg by mouth every Monday, Wednesday, and Friday.   Cyanocobalamin (VITAMIN B12 PO) Take by mouth.   folic acid (FOLVITE) 341 MCG tablet Take 400 mcg by mouth daily.   glucosamine-chondroitin 500-400 MG tablet Take 1 tablet by mouth 2 (two) times daily.   LIPITOR 40 MG tablet TAKE 1/2 (ONE-HALF) TABLET BY MOUTH ONCE DAILY (Patient taking differently: Take 20 mg by mouth daily at 6 PM. )   metFORMIN (GLUCOPHAGE) 500 MG tablet Take 500 mg by mouth 2 (two) times daily with a meal.    metoprolol succinate (TOPROL-XL) 25 MG 24 hr tablet Take 1 tablet (25 mg total) by mouth daily.   montelukast (SINGULAIR) 10 MG tablet Take 10 mg by mouth at bedtime.     olmesartan (BENICAR) 40 MG tablet TAKE 1 TABLET BY MOUTH ONCE DAILY (Patient taking differently: Take 40 mg by mouth daily. )   omeprazole (PRILOSEC) 20 MG capsule Take 20 mg by mouth 2 (two) times daily before a meal.   spironolactone (ALDACTONE) 25 MG tablet Take 1 tablet (25 mg total) by mouth 2 (two) times daily.   terazosin (HYTRIN) 5 MG capsule Take 5 mg by mouth at bedtime.    testosterone (ANDROGEL) 50 MG/5GM GEL Place 5 g onto the skin daily.   vitamin E 400 UNIT capsule Take 400 Units by mouth daily.     Allergies:   Codeine and Nsaids   Social History   Tobacco Use   Smoking status: Never Smoker   Smokeless tobacco: Never Used  Substance Use Topics   Alcohol use: Yes    Alcohol/week: 2.0 standard drinks    Types: 2 Standard drinks or equivalent per week   Drug use: Not on file    Social history is notable that he is widowed; 4 grandchildren.  No tobacco.  Family Hx: The patient's family history includes Diabetes in his mother; Heart disease in his maternal grandfather and mother; Leukemia in his brother; Lung cancer in his father; Stroke in his mother and paternal grandfather.  ROS:   Please see the history of present illness.    He denies fevers chills night sweats, change in taste or olfaction He continues to experience daily leg swelling Mild shortness of breath with activity No anginal symptoms No recent gout flares Occasional GERD, controlled with omeprazole Positive for diabetes Positive for hyperlipidemia was advised to take a prescription fish oil by Dr. Osborne Casco but he did not do this due to cost. Denies any awareness of sleep apnea All other systems reviewed and are negative.   Prior CV studies:   The following studies were reviewed today:  I have again reviewed his myocardial perfusion study of April 2018 as well as his lower extremity Doppler studies of October 2019.  Labs/Other Tests and Data Reviewed:    EKG:  An ECG dated 08/08/2018  was personally reviewed today and demonstrated:  Sinus rhythm at 79 bpm with first-degree AV block with appeared normal at 248 ms.  Left bundle branch block and left axis deviatio  Recent Labs: 05/26/2018: Hemoglobin 13.9; Platelets 212 11/18/2018: BUN 10;  Creatinine, Ser 1.07; Potassium 4.6; Sodium 146   Recent Lipid Panel Lab Results  Component Value Date/Time   CHOL 101 (L) 11/25/2015 01:43 PM   TRIG 170 (H) 11/25/2015 01:43 PM   HDL 42 11/25/2015 01:43 PM   CHOLHDL 2.4 11/25/2015 01:43 PM   LDLCALC 25 11/25/2015 01:43 PM    Wt Readings from Last 3 Encounters:  12/16/18 201 lb 1.6 oz (91.2 kg)  08/08/18 205 lb 3.2 oz (93.1 kg)  05/27/18 206 lb 5.6 oz (93.6 kg)     Objective:    Vital Signs:  BP (!) 167/91    Pulse (!) 52    Temp 97.7 F (36.5 C)    Ht _0  (1.676 m)    Wt 201 lb 1.6 oz (91.2 kg)    BMI 32.46 kg/m    He states his resting heart rate typically is in the low to mid 50s.  But at night according to his apple watch his pulse may drop to around 40.  He is well-developed and well-nourished in no acute distress. HEENT is unchanged. There is no jugular venous distention Breathing is normal I did not hear any audible wheezing He took his pulse and his heart rate was regular but bradycardic at 52 He denied any chest wall tenderness to palpation There was no abdominal discomfort to palpation He woke up this morning and did not have any edema but had worn compression stockings yesterday from 2:30 PM until 10:30 PM.   He denies any recent gout flares He denies any new neurologic symptoms He has normal cognition and affect  ASSESSMENT & PLAN:    1. CAD/status post CABG revascularization: In 2001 the patient had undergone a preoperative nuclear perfusion study prior to potentially being a potential donor of his bone marrow to his brother.  He was found to have asymptomatic significant abnormality and catheterization revealed significant multivessel CAD.  He was  asymptomatic and underwent successful CABG revascularization surgery. He is without anginal symptoms.  His last nuclear perfusion study was in April 2018. 2. Essential hypertension: Blood pressure today is elevated despite taking olmesartan 40 mg, spironolactone 25 mg, Toprol-XL 25 mg and terazosin 5 mg.  He has continued to have leg swelling and also is consistently bradycardic.  For this reason I will decrease Toprol-XL to 12.5 mg daily.  I will initiate a loop diuretic with furosemide 20 mg daily.  He will continue with spironolactone currently at 25 mg daily as well as his olmesartan and terazosin.  He will monitor his blood pressure closely.  I will recheck a be met in 2 to 3 weeks to reassess renal function with the addition of furosemide.  We discussed blood pressure goal less than 130/80 and preferably less than 120/80. 3. Remote history of probable second-degree AV block: This occurred while on ankle beta-blocker therapy, presently has been maintaining sinus bradycardia but due to nocturnal bradycardia down to 40 and his resting pulse at 52 we will further decrease Toprol to 12.5 mg daily. 4. Bilateral leg edema: As noted above will add furosemide 20 mg daily.  His bilateral edema has improved with support stockings.  I have suggested that rather than wait until midday when he develops the edema that he placed the support stockings on in the morning which hopefully can reduce the potential recurrence throughout the day. 5. Mixed hyperlipidemia: Labs are followed by Dr. Osborne Casco.  LDL cholesterol has consistently been optimal but triglycerides elevated.  He was advised to initiate the  CPAP but did not do this due to cost.  He is taking over-the-counter fish oil. 6. History of gout: No recurrence on allopurinol daily in addition to colchicine which he is now taking only 3 days/week 7. Type 2 diabetes mellitus with complication without long-term use of insulin: Currently on metformin 8. GERD: Controlled  with omeprazole.  COVID-19 Education: The signs and symptoms of COVID-19 were discussed with the patient and how to seek care for testing (follow up with PCP or arrange E-visit).  The importance of social distancing was discussed today.  Time:   Today, I have spent 25 minutes with the patient with telehealth technology discussing the above problems.     Medication Adjustments/Labs and Tests Ordered: Current medicines are reviewed at length with the patient today.  Concerns regarding medicines are outlined above.   Tests Ordered: No orders of the defined types were placed in this encounter.   Medication Changes: No orders of the defined types were placed in this encounter.   Disposition:  Follow up Bmet in 2-3 weeks; ov in 2-3 months  Signed, Shelva Majestic, MD  12/16/2018 8:02 AM    [place]

## 2019-01-09 LAB — BASIC METABOLIC PANEL
BUN/Creatinine Ratio: 17 (ref 10–24)
BUN: 25 mg/dL (ref 8–27)
CO2: 21 mmol/L (ref 20–29)
Calcium: 10.1 mg/dL (ref 8.6–10.2)
Chloride: 102 mmol/L (ref 96–106)
Creatinine, Ser: 1.45 mg/dL — ABNORMAL HIGH (ref 0.76–1.27)
GFR calc Af Amer: 50 mL/min/{1.73_m2} — ABNORMAL LOW (ref >59)
GFR calc non Af Amer: 44 mL/min/{1.73_m2} — ABNORMAL LOW (ref >59)
Glucose: 176 mg/dL — ABNORMAL HIGH (ref 65–99)
Potassium: 4.2 mmol/L (ref 3.5–5.2)
Sodium: 141 mmol/L (ref 134–144)

## 2019-01-30 ENCOUNTER — Other Ambulatory Visit: Payer: Self-pay | Admitting: Cardiovascular Disease

## 2019-01-30 NOTE — Telephone Encounter (Signed)
Rx(s) sent to pharmacy electronically.  

## 2019-04-17 ENCOUNTER — Encounter: Payer: Self-pay | Admitting: Cardiovascular Disease

## 2019-04-17 ENCOUNTER — Other Ambulatory Visit: Payer: Self-pay

## 2019-04-17 ENCOUNTER — Ambulatory Visit: Payer: Medicare Other | Admitting: Cardiovascular Disease

## 2019-04-17 VITALS — BP 139/81 | HR 82 | Ht 66.0 in | Wt 209.0 lb

## 2019-04-17 DIAGNOSIS — I10 Essential (primary) hypertension: Secondary | ICD-10-CM | POA: Diagnosis not present

## 2019-04-17 DIAGNOSIS — E118 Type 2 diabetes mellitus with unspecified complications: Secondary | ICD-10-CM

## 2019-04-17 DIAGNOSIS — G4733 Obstructive sleep apnea (adult) (pediatric): Secondary | ICD-10-CM

## 2019-04-17 DIAGNOSIS — E785 Hyperlipidemia, unspecified: Secondary | ICD-10-CM

## 2019-04-17 DIAGNOSIS — Z951 Presence of aortocoronary bypass graft: Secondary | ICD-10-CM

## 2019-04-17 DIAGNOSIS — R0683 Snoring: Secondary | ICD-10-CM

## 2019-04-17 DIAGNOSIS — I2510 Atherosclerotic heart disease of native coronary artery without angina pectoris: Secondary | ICD-10-CM | POA: Diagnosis not present

## 2019-04-17 DIAGNOSIS — M109 Gout, unspecified: Secondary | ICD-10-CM

## 2019-04-17 MED ORDER — VASCEPA 1 G PO CAPS: 2.0000 | ORAL_CAPSULE | Freq: Two times a day (BID) | ORAL | 2 refills | Status: DC

## 2019-04-17 MED ORDER — FUROSEMIDE 40 MG PO TABS: 40.0000 mg | ORAL_TABLET | Freq: Every day | ORAL | 3 refills | Status: DC

## 2019-04-17 MED ORDER — SPIRONOLACTONE 25 MG PO TABS: 12.5000 mg | ORAL_TABLET | Freq: Every day | ORAL | 3 refills | Status: DC

## 2019-04-17 NOTE — Progress Notes (Signed)
Patient ID: Peter Mullen, male   DOB: 08-20-1931, 85 y.o.   MRN: [id number]    Primary M.D.: Dr. Delfino Lovett Tisoivic  HPI: Peter Mullen is a 85 y.o. male who presents to the office today for a 4 month month follow-up cardiology evaluation.    [name] has established CAD and in March 2001 underwent CABG revascularization surgery with a LIMA to the LAD, SVG to diagonal, SVG to the PLA, and SVG to the PDA vessel. Additional medical problems include hypertension, type 2 diabetes mellitus, mild obstructive sleep apnea not on CPAP therapy and obesity.   In October 2012 an nuclear perfusion study showed normal perfusion with a post stress ejection fraction of 62%. He has documented grade 1 diastolic dysfunction EA ratio 0.71.   A 2-D echo irevealed mild aortic sclerosis without stenosis, trace MR and trace TR. He has a history of mixed hyperlipidemia. He sees Dr. [name] for his primary medical care. Laboratory showed a normal chemistry profile, cholesterol 93, triglycerides, 110 HDL 31, and LDL 40 on his current medical regimen.  A two-year followup nuclear perfusion study on 06/30/2013 was low risk and demonstrated apical thinning with increased attenuation on resting images without significant ischemia. He did have mild LV dysfunction with an ejection fraction of 47% and focal apical inferior hypocontractility.    On 09/20/2013 a follow-up echo Doppler study showed an ejection fraction of 55% without diagnostic regional wall motion abnormalities although septal bounce was noted. He had grade 1 diastolic dysfunction. He had moderate calcification of his aortic valve without stenosis and there was moderate calcification of the mitral annulus with mildly calcified leaflets without significant regurgitation. His left atrium was mildly dilated. He had normal pulmonary pressures.  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  At that time, he had been on Toprol 100 mg daily and I  reduced this to 50 mg per day.  He has continued to take losartan 100 mg, HCTZ in addition to terazocin for hypertension.   In 2017 he had broken 2 of his molars and has been evaluated at the dental clinic at [facility].  He has been on chronic Plavix.  He had noticed that his blood pressure has been consistently elevated despite taking his vacations which include HCTZ 25 mg, losartan 100 mg, Toprol-XL 25 mg, and Hytrin 5 mg daily.  I added amlodipine 5 mg to his medical regimen for improved blood pressure control.  He had held his Plavix for dental work on his molars.   He is remaining active and typically bowls 2 times per week.  He has been having occasional pain down his left leg which may be attributable to spinal stenosis and in the past he had seen Dr. [name] for this, which had limited his walking.  He will need to have a molar extraction as well as an impacted was some tooth removed at the [place] [facility], which is scheduled for later this month.  He underwent a nuclear perfusion study on 11/03/2016 in follow-up of his remote CABG surgery and this continued to be low risk and showed only a very small prior basal/mid inferoseptal defect without associated ischemia.  Ejection fraction was 55% and he had normal wall motion.    He underwent a nuclear perfusion study in April 2018, which remained low risk and showed a small defect in the basal inferoseptal and mid inferoseptal region consistent with probable scar without associated ischemia.  EF was 55% with normal wall  motion. Laboratory in June 2018 which showed total cholesterol 122, HDL 38, LDL 39, but triglycerides were elevated at 224.  Hemoglobin A1c was 6.2.  TSH was 3.25.     When I last saw him in July 2019 he remained stable and was without chest pain.  He  admitted to some exertional fatigue.  He was  found to have an elevated uric acid level and was just started on allopurinol.  He has been evaluated by Dr. [name] for left leg discomfort with  some left ankle swelling.  He denies any increasing shortness of breath or significant palpitations.    Since I last saw him he has continued to do well.  He still experiences occasional leg pain but this has improved.  He had undergone lower extremity Doppler assessment on May 06, 2018 which showed noncompressible ABIs bilaterally but he had normal toe brachial index bilaterally.  He has not had any acute gout flares.  He had recently fallen while bowling and banged his head.  He was hospitalized overnight and was found to have a small subdural hematoma.  He was assessed by Dr. [name] at that time recommended holding Plavix for at least 2 weeks and okay to continue aspirin.  He has been off Plavix since his head trauma May 26, 2018.    I last saw him in the office in January 2020 for follow-up Cardiologic evaluation.  At that time he was feeling well and was without chest pain he denied PND orthopnea.  He was sleeping well.  I recommended discontinuance of hydrochlorothiazide due to potential increased uric acid risk and suggested slight titration of spironolactone up to 25 mg daily.  Subsequently, he was evaluated in a telemedicine visit in May 2020.  At that time he was feeling well but continued to have issues with leg swelling.  He had noticed some improvement in his leg swelling with the increase in spironolactone dose but every day by midday he had developed recurrent edema.  He was using compression stockings.  He was bradycardic and his Toprol dose was reduced to 12.5 mg because of continued bilateral leg edema furosemide 20 mg was added to his regimen.  Over the last several months, he has continued to do well and is been without any anginal symptomatology.  He has recently begun to notice some mild tenderness in the breast region which had occurred on his increase spironolactone dosing.  He had been taking Spironolactone 25 mg twice a day.  Otherwise, he feels well.  He has had frequent  awakenings and has not been sleeping as well.  Remotely he had decided against pursuing CPAP therapy with remotely diagnosed sleep apnea well.  He presents for evaluation.  Past medical history: CAD, status post CABG, hypertension, type 2 diabetes mellitus, mixed hyperlipidemia, obstructive sleep apnea, mild. He is status post removal of a a basal cell cancer removed from his left cheek by Dr. Sarajane Jews.    Past surgical history notable for CABG surgery, cataract surgery 2005 prostate surgery 1989.  Current Outpatient Medications  Medication Sig Dispense Refill  . allopurinol (ZYLOPRIM) 300 MG tablet Take 300 mg by mouth daily.  5  . aspirin 81 MG tablet Take 81 mg by mouth every evening.     [name] BLACK ELDERBERRY PO Take 50 mg by mouth daily.    . cetirizine (ZYRTEC) 10 MG tablet Take 10 mg by mouth daily.    . Cholecalciferol (VITAMIN D3) 50 MCG (2000 UT) capsule Take 2,000 Units  by mouth daily.    . colchicine 0.6 MG tablet Take 0.6 mg by mouth every Monday, Wednesday, and Friday.    . Cyanocobalamin (VITAMIN B12 PO) Take by mouth.    . folic acid (FOLVITE) 505 MCG tablet Take 400 mcg by mouth daily.    [name] glucosamine-chondroitin 500-400 MG tablet Take 1 tablet by mouth 2 (two) times daily.    [name] guaiFENesin (MUCINEX) 600 MG 12 hr tablet Take 600 mg by mouth 2 (two) times daily.    [name] LIPITOR 40 MG tablet Take 0.5 tablets (20 mg total) by mouth daily at 6 PM. 45 tablet 3  . metFORMIN (GLUCOPHAGE) 500 MG tablet Take 500 mg by mouth 2 (two) times daily with a meal.     . metoprolol succinate (TOPROL-XL) 25 MG 24 hr tablet Take 0.5 tablets (12.5 mg total) by mouth daily. 90 tablet 1  . montelukast (SINGULAIR) 10 MG tablet Take 10 mg by mouth at bedtime.     . nitroGLYCERIN (NITROSTAT) 0.4 MG SL tablet Place 1 tablet under the tongue daily as needed for chest pain.    [name] olmesartan (BENICAR) 40 MG tablet TAKE 1 TABLET BY MOUTH ONCE DAILY (Patient taking differently: Take 40 mg by mouth daily. ) 90  tablet 3  . omeprazole (PRILOSEC) 20 MG capsule Take 20 mg by mouth 2 (two) times daily before a meal.    . spironolactone (ALDACTONE) 25 MG tablet Take 0.5 tablets (12.5 mg total) by mouth daily. 180 tablet 3  . terazosin (HYTRIN) 5 MG capsule Take 5 mg by mouth at bedtime.     [name] testosterone (ANDROGEL) 50 MG/5GM GEL Place 5 g onto the skin 2 (two) times daily.     . vitamin E 400 UNIT capsule Take 400 Units by mouth daily.    . furosemide (LASIX) 40 MG tablet Take 1 tablet (40 mg total) by mouth daily. 90 tablet 3  . Icosapent Ethyl (VASCEPA) 1 g CAPS Take 2 capsules (2 g total) by mouth 2 (two) times daily. 120 capsule 2   No current facility-administered medications for this visit.     He is widowed per history children 4 grandchildren. There is no tobacco use. He does drink occasional alcohol.  ROS General: Negative; No fevers, chills, or night sweats;  HEENT: Positive for cracks in his molars; No changes in vision or hearing, sinus congestion, difficulty swallowing Pulmonary: Negative; No cough, wheezing, shortness of breath, hemoptysis Cardiovascular: Negative; No chest pain, presyncope, syncope, palpitations GI: Negative; No nausea, vomiting, diarrhea, or abdominal pain GU: Negative; No dysuria, hematuria, or difficulty voiding Musculoskeletal: Positive for back pain with leg discomfort secondary to spinal stenosis; intermittent left leg discomfort being evaluated by Dr. Lindwood Qua Rheumatologic: Gout Hematologic/Oncology: Negative; no easy bruising, bleeding Endocrine: Negative; no heat/cold intolerance; no diabetes Neuro: Negative; no changes in balance, headaches Skin: history of removal of prior basal cell carcinoma with clear margins; No rashes or skin lesions Psychiatric: Negative; No behavioral problems, depression Sleep: mild sleep apnea, not on CPAP therapy; No  daytime sleepiness, hypersomnolence, bruxism, restless legs, hypnogognic hallucinations, no cataplexy Other  comprehensive 14 point system review is negative.   PE BP 139/81   Pulse 82   Ht 5' 6" (1.676 m)   Wt 209 lb (94.8 kg)   BMI 33.73 kg/m    Repeat blood pressure by me was 142/80.  Wt Readings from Last 3 Encounters:  04/17/19 209 lb (94.8 kg)  12/16/18 201 lb 1.6 oz (91.2  kg)  08/08/18 205 lb 3.2 oz (93.1 kg)   General: Alert, oriented, no distress.  Skin: normal turgor, no rashes, warm and dry HEENT: Normocephalic, atraumatic. Pupils equal round and reactive to light; sclera anicteric; extraocular muscles intact;  Nose without nasal septal hypertrophy Mouth/Parynx benign; Mallinpatti scale 3 Neck: No JVD, no carotid bruits; normal carotid upstroke Lungs: clear to ausculatation and percussion; no wheezing or rales Chest wall: without tenderness to palpitation Heart: PMI not displaced, RRR, s1 s2 normal, 1/6 systolic murmur, no diastolic murmur, no rubs, gallops, thrills, or heaves Abdomen: soft, nontender; no hepatosplenomehaly, BS+; abdominal aorta nontender and not dilated by palpation. Back: no CVA tenderness Pulses 2+ Musculoskeletal: full range of motion, normal strength, no joint deformities Extremities: Improvement in previous leg edema;no clubbing cyanosis, Homan's sign negative  Neurologic: grossly nonfocal; Cranial nerves grossly wnl Psychologic: Normal mood and affect   ECG (independently read by me): Sinus rhythm 82 bpm with PACs and blocked PACs.  January 2020 ECG (independently read by me): Sinus rhythm at 79 bpm with first-degree AV block with appeared normal at 248 ms.  Left bundle branch block and left axis deviation.  July 2019 ECG (independently read by me): Sinus rhythm with first-degree AV block, frequent PVCs with transient bigeminy.  QTc interval 451 ms.  PR interval 242 ms.  November 2018 ECG (independently read by me): Difficult to discern but probable sinus rhythm with PACs  October 2017 ECG (independently read by me): Normal sinus rhythm with  first-degree AV block.  PAC.  Left bundle branch block.  July 2017 ECG (independently read by me): Sinus rhythm at 64 bpm.  PACs, nonspecific interventricular block.  First-degree AV block.  April 2017 ECG (independently read by me): Normal sinus rhythm at 64 bpm with occasional PVC and sinus arrhythmia.  First-degree AV block with a PR interval at 278 ms.  Blocked APC.  January 2017 ECG (independently read by me):  Sinus rhythm marked first-degree block.there also was a blocked APC.   He was a suggestion of possible 2nd degree Wenkebach block.  July 2016 ECG (independently read by me): Sinus rhythm with first-degree AV block with a PR interval at 248 ms.  May 2016 ECG (independently read by me): Probable 2nd degree Wenkebach block  November 2015 ECG (independently read by me): normal sinus rhythm with first-degree AV block with a PR interval at 230 milliseconds.  March 2015 ECG: (independently read by me) sinus rhythm at 73 beats per minute. First degree block with PR interval 248 ms. Isolated unifocal PVCs.   LABS:  BMP Latest Ref Rng & Units 01/09/2019 11/18/2018 05/26/2018  Glucose 65 - 99 mg/dL 176(H) 184(H) 228(H)  BUN 8 - 27 mg/dL _0 Creatinine 0.76 - 1.27 mg/dL 1.45(H) 1.07 1.27(H)  BUN/Creat Ratio 10 - 24 17 9(L) -  Sodium 134 - 144 mmol/L 141 146(H) 139  Potassium 3.5 - 5.2 mmol/L 4.2 4.6 3.7  Chloride 96 - 106 mmol/L 102 107(H) 103  CO2 20 - 29 mmol/L _1 Calcium 8.6 - 10.2 mg/dL 10.1 9.5 9.1   Hepatic Function Latest Ref Rng & Units 11/25/2015 10/28/2013  Total Protein 6.1 - 8.1 g/dL 6.6 7.5  Albumin 3.6 - 5.1 g/dL 4.2 3.9  AST 10 - 35 U/L 29 35  ALT 9 - 46 U/L 26 26  Alk Phosphatase 40 - 115 U/L 59 30(L)  Total Bilirubin 0.2 - 1.2 mg/dL 0.6 0.5   CBC Latest Ref Rng & Units  05/26/2018 11/25/2015 10/28/2013  WBC 4.0 - 10.5 K/uL 7.2 7.5 5.8  Hemoglobin 13.0 - 17.0 g/dL 13.9 14.6 15.2  Hematocrit 39.0 - 52.0 % 44.3 44.6 44.6  Platelets 150 - 400 K/uL 212 227 230    Lab Results  Component Value Date   TSH 3.25 11/25/2015    Lab Results  Component Value Date   HGBA1C 6.8 (H) 11/25/2015    Lipid Panel     Component Value Date/Time   CHOL 101 (L) 11/25/2015 1343   TRIG 170 (H) 11/25/2015 1343   HDL 42 11/25/2015 1343   CHOLHDL 2.4 11/25/2015 1343   VLDL 34 (H) 11/25/2015 1343   LDLCALC 25 11/25/2015 1343     RADIOLOGY: No results found.  IMPRESSION:  1. Essential hypertension   2. Snoring   3. CAD in native artery   4. Hx of CABG   5. Hyperlipidemia with target LDL less than 70   6. Type 2 diabetes mellitus with complication, without long-term current use of insulin (HCC)   7. OSA (obstructive sleep apnea)   8. Gout, unspecified cause, unspecified chronicity, unspecified site    ASSESSMENT AND PLAN:  [name] is an 85 year old gentleman who is status post CABG revascularization surgery in 2001. At that time, a preoperative nuclear perfusion study was done prior to him undergoing potential donor bone marrow transplantation for his brother which was abnormal and led to his catheterization and ultimate detection of significant multivessel CAD. He was asymptomatic without chest pain.  He has a history of hypertension and  has been demonstrated to have transient had second-degree AV block, type I.  He has had issues with leg edema and in the past gout.  His blood pressure today is highly increased based on new hypertensive guidelines.  He is recently developed some breast tenderness which most likely is the result of his spironolactone dose contributing to mild gynecomastia.  He has been taking furosemide 20 mg daily, Toprol-XL 12.5 mg, olmesartan 40 mg as well as terra Zosyn 5 mg and spironolactone 25 mg twice a day.  I have recommended he reduce his spironolactone dose to just 12.5 mg daily and I will further titrate furosemide to 40 mg.  He continues to experience some breast tenderness then Spironolactone will be discontinued altogether.  He  is not having any anginal symptomatology.  His most recent lipid panel in August 2020 showed a total cholesterol 123, HDL 29, LDL 32, and triglycerides remain elevated at 311.  He has been on atorvastatin 40 mg daily.  With his increased triglycerides I have recommended the Vascepa 2 capsules twice a day.  He has not had any recent gout flares and continues to take allopurinol 3 mg daily and colchicine on Monday Wednesday and Fridays.  He admits recently that his sleep is not as good as it had been previously.  He has agreed to undergo reevaluation for his obstructive sleep apnea.  He is diabetic on metformin.  I will see him in 4 months for follow-up evaluation.  Time spent: 25 minutes  Troy Sine, MD, [facility]  04/19/2019 8:10 AM

## 2019-04-17 NOTE — Patient Instructions (Signed)
Medication Instructions:  Decrease Spironolactone to 1/2 tablet (12.5 mg daily) Increase Furosemide to 40 mg daily. Try to restart Vascepa 2 capsules twice daily.  If you need a refill on your cardiac medications before your next appointment, please call your pharmacy.   Testing/Procedures: Your physician has recommended that you have a sleep study. This test records several body functions during sleep, including: brain activity, eye movement, oxygen and carbon dioxide blood levels, heart rate and rhythm, breathing rate and rhythm, the flow of air through your mouth and nose, snoring, body muscle movements, and chest and belly movement.   Follow-Up: At [facility], you and your health needs are our priority.  As part of our continuing mission to provide you with exceptional heart care, we have created designated Provider Care Teams.  These Care Teams include your primary Cardiologist (physician) and Advanced Practice Providers (APPs -  Physician Assistants and Nurse Practitioners) who all work together to provide you with the care you need, when you need it. You will need a follow up appointment in 4 months.  Please call our office 2 months in advance to schedule this appointment.  You may see Shelva Majestic, MD or one of the following Advanced Practice Providers on your designated Care Team: [place], [place] . Fabian Sharp, PA-C

## 2019-04-19 ENCOUNTER — Telehealth: Payer: Self-pay | Admitting: *Deleted

## 2019-04-19 ENCOUNTER — Encounter: Payer: Self-pay | Admitting: Cardiovascular Disease

## 2019-04-19 NOTE — Telephone Encounter (Signed)
Patient notified of both sleep study and [place] appointments. He states that he cannot make that appointment date. He will be out of town. Contact information given to the patient to call the sleep lab to reschedule appointment.

## 2019-04-24 ENCOUNTER — Other Ambulatory Visit (HOSPITAL_BASED_OUTPATIENT_CLINIC_OR_DEPARTMENT_OTHER): Payer: Self-pay | Admitting: Cardiovascular Disease

## 2019-04-29 ENCOUNTER — Other Ambulatory Visit (HOSPITAL_COMMUNITY): Payer: Medicare Other

## 2019-05-03 ENCOUNTER — Encounter (HOSPITAL_BASED_OUTPATIENT_CLINIC_OR_DEPARTMENT_OTHER): Payer: Medicare Other | Admitting: Cardiovascular Disease

## 2019-05-06 ENCOUNTER — Other Ambulatory Visit (HOSPITAL_COMMUNITY)
Admission: RE | Admit: 2019-05-06 | Discharge: 2019-05-06 | Disposition: A | Discharge status: Home / Self Care | Payer: Medicare Other | Source: Ambulatory Visit | Attending: Cardiovascular Disease | Admitting: Cardiovascular Disease

## 2019-05-06 DIAGNOSIS — Z01812 Encounter for preprocedural laboratory examination: Secondary | ICD-10-CM | POA: Insufficient documentation

## 2019-05-06 DIAGNOSIS — Z20828 Contact with and (suspected) exposure to other viral communicable diseases: Secondary | ICD-10-CM | POA: Insufficient documentation

## 2019-05-07 LAB — NOVEL CORONAVIRUS, NAA (HOSP ORDER, SEND-OUT TO REF LAB; TAT 18-24 HRS): SARS-CoV-2, NAA: NOT DETECTED

## 2019-05-10 ENCOUNTER — Ambulatory Visit (HOSPITAL_BASED_OUTPATIENT_CLINIC_OR_DEPARTMENT_OTHER): Payer: Medicare Other | Attending: Cardiovascular Disease | Admitting: Cardiovascular Disease

## 2019-05-10 ENCOUNTER — Other Ambulatory Visit: Payer: Self-pay

## 2019-05-10 VITALS — Temp 97.7°F | Ht 66.0 in | Wt 200.0 lb

## 2019-05-10 DIAGNOSIS — Z7984 Long term (current) use of oral hypoglycemic drugs: Secondary | ICD-10-CM | POA: Diagnosis not present

## 2019-05-10 DIAGNOSIS — I10 Essential (primary) hypertension: Secondary | ICD-10-CM | POA: Insufficient documentation

## 2019-05-10 DIAGNOSIS — R0683 Snoring: Secondary | ICD-10-CM | POA: Insufficient documentation

## 2019-05-10 DIAGNOSIS — Z79899 Other long term (current) drug therapy: Secondary | ICD-10-CM | POA: Insufficient documentation

## 2019-05-10 DIAGNOSIS — G4730 Sleep apnea, unspecified: Secondary | ICD-10-CM

## 2019-05-10 DIAGNOSIS — R0902 Hypoxemia: Secondary | ICD-10-CM | POA: Diagnosis not present

## 2019-05-10 DIAGNOSIS — Z7982 Long term (current) use of aspirin: Secondary | ICD-10-CM | POA: Diagnosis not present

## 2019-05-10 DIAGNOSIS — G478 Other sleep disorders: Secondary | ICD-10-CM

## 2019-05-22 ENCOUNTER — Encounter (HOSPITAL_BASED_OUTPATIENT_CLINIC_OR_DEPARTMENT_OTHER): Payer: Self-pay | Admitting: Cardiovascular Disease

## 2019-05-22 NOTE — Procedures (Signed)
Patient Name: Peter Mullen, Peter Mullen Date: 05/10/2019 Gender: Male D.O.B: [birth year] Age (years): 19 Referring Provider: Shelva Majestic MD, ABSM Height (inches): 66 Interpreting Physician: Shelva Majestic MD, ABSM Weight (lbs): 200 RPSGT: Laren Everts BMI: 32 MRN: [id number] Neck Size: 17.50  CLINICAL INFORMATION Sleep Study Type: NPSG  Indication for sleep study: Diabetes, Hypertension, Obesity, Snoring  Epworth Sleepiness Score: 3  SLEEP STUDY TECHNIQUE As per the AASM Manual for the Scoring of Sleep and Associated Events v2.3 (April 2016) with a hypopnea requiring 4% desaturations.  The channels recorded and monitored were frontal, central and occipital EEG, electrooculogram (EOG), submentalis EMG (chin), nasal and oral airflow, thoracic and abdominal wall motion, anterior tibialis EMG, snore microphone, electrocardiogram, and pulse oximetry.  MEDICATIONS     allopurinol (ZYLOPRIM) 300 MG tablet         aspirin 81 MG tablet         BLACK ELDERBERRY PO         cetirizine (ZYRTEC) 10 MG tablet         Cholecalciferol (VITAMIN D3) 50 MCG (2000 UT) capsule         colchicine 0.6 MG tablet         Cyanocobalamin (VITAMIN 123456 PO)         folic acid (FOLVITE) Q000111Q MCG tablet         furosemide (LASIX) 40 MG tablet         glucosamine-chondroitin 500-400 MG tablet         guaiFENesin (MUCINEX) 600 MG 12 hr tablet         Icosapent Ethyl (VASCEPA) 1 g CAPS         LIPITOR 40 MG tablet         metFORMIN (GLUCOPHAGE) 500 MG tablet         metoprolol succinate (TOPROL-XL) 25 MG 24 hr tablet         montelukast (SINGULAIR) 10 MG tablet         nitroGLYCERIN (NITROSTAT) 0.4 MG SL tablet         olmesartan (BENICAR) 40 MG tablet         omeprazole (PRILOSEC) 20 MG capsule         spironolactone (ALDACTONE) 25 MG tablet         terazosin (HYTRIN) 5 MG capsule         testosterone (ANDROGEL) 50 MG/5GM GEL         vitamin E 400 UNIT capsule      Medications self-administered  by patient taken the night of the study : AFRIN, ANDROGEL, LIPITOR, METFORMIN, Olmesartan, SINGULAIR, TERAZOSIN, Vascepa, ZYRTEC  SLEEP ARCHITECTURE The study was initiated at 10:17:05 PM and ended at 4:50:26 AM.  Sleep onset time was 8.5 minutes and the sleep efficiency was 41.3%. The total sleep time was 162.5 minutes.  Stage REM latency was 43.5 minutes.  The patient spent 17.85% of the night in stage N1 sleep, 73.85% in stage N2 sleep, 0.00% in stage N3 and 8.3% in REM.  Alpha intrusion was absent.  Supine sleep was 70.15%.  RESPIRATORY PARAMETERS The overall apnea/hypopnea index (AHI) was 4.8 per hour. The respiratory disturbance index (RDI) was 33.2/h. There were 3 total apneas, including 3 obstructive, 0 central and 0 mixed apneas. There were 10 hypopneas and 77 RERAs.  The AHI during Stage REM sleep was 4.4 per hour.  AHI while supine was 6.8 per hour.  The mean oxygen saturation was 92.27%. The minimum SpO2 during  sleep was 89.00%.  Moderate snoring was noted during this study.  CARDIAC DATA The 2 lead EKG demonstrated sinus rhythm. The mean heart rate was 66.91 beats per minute. Other EKG findings include: PVCs.  LEG MOVEMENT DATA The total PLMS were 0 with a resulting PLMS index of 0.00. Associated arousal with leg movement index was 0.0 .  IMPRESSIONS - Increased upper airway resistance with borderline mild obstructive sleep apnea (AHI 4.8/h; however, RDI was significantly increased at 33.2/h). Mild sleep apnea was present  with supine sleep.  - No significant central sleep apnea occurred during this study (CAI = 0.0/h). - Severe oxygen desaturation was noted during this study (Min O2 = 89.00%). - The patient snored with moderate snoring volume. - EKG findings include PVCs. - Clinically significant periodic limb movements did not occur during sleep. No significant associated arousals.  DIAGNOSIS - Nocturnal Hypoxemia (327.26 [G47.36 ICD-10]) - Moderate snoring   RECOMMENDATIONS - Effort shouild be made to optimize nasal and oropharyngeal patency.  - Consider initial alternatives for mild sleep apnea and snoring. - The patient should be counseled to avoid suoine sleep. - Avoid alcohol, sedatives and other CNS depressants that may worsen sleep apnea and disrupt normal sleep architecture. - Sleep hygiene should be reviewed to assess factors that may improve sleep quality. - Weight management (BMI 32) and regular exercise should be initiated or continued if appropriate.  [Electronically signed] 05/22/2019 05:48 PM  Shelva Majestic MD, [facility], [place], American Board of Sleep Medicine   NPI: [id number] [place] PH: [phone]   FX: [phone] [place]

## 2019-05-24 ENCOUNTER — Telehealth: Payer: Self-pay | Admitting: *Deleted

## 2019-05-24 NOTE — Telephone Encounter (Signed)
Patient notified of NSPG results and recommendations. He states that he is not experiencing any symptoms at this time. He was advised to call us back if he decides to proceed with alternatives.

## 2019-07-19 ENCOUNTER — Other Ambulatory Visit (HOSPITAL_BASED_OUTPATIENT_CLINIC_OR_DEPARTMENT_OTHER): Payer: Self-pay | Admitting: Cardiovascular Disease

## 2019-07-24 ENCOUNTER — Other Ambulatory Visit: Payer: Self-pay | Admitting: Cardiovascular Disease

## 2019-08-02 ENCOUNTER — Other Ambulatory Visit: Payer: Self-pay | Admitting: *Deleted

## 2019-08-02 MED ORDER — ATORVASTATIN CALCIUM 40 MG PO TABS: 20.0000 mg | ORAL_TABLET | Freq: Every day | ORAL | 3 refills | Status: DC

## 2019-09-01 ENCOUNTER — Ambulatory Visit: Payer: Medicare Other | Admitting: Cardiovascular Disease

## 2019-09-01 ENCOUNTER — Other Ambulatory Visit: Payer: Self-pay

## 2019-09-01 VITALS — BP 112/60 | HR 82 | Ht 66.0 in | Wt 204.6 lb

## 2019-09-01 DIAGNOSIS — G4733 Obstructive sleep apnea (adult) (pediatric): Secondary | ICD-10-CM

## 2019-09-01 DIAGNOSIS — Z951 Presence of aortocoronary bypass graft: Secondary | ICD-10-CM

## 2019-09-01 DIAGNOSIS — I10 Essential (primary) hypertension: Secondary | ICD-10-CM | POA: Diagnosis not present

## 2019-09-01 DIAGNOSIS — I2510 Atherosclerotic heart disease of native coronary artery without angina pectoris: Secondary | ICD-10-CM | POA: Diagnosis not present

## 2019-09-01 DIAGNOSIS — E118 Type 2 diabetes mellitus with unspecified complications: Secondary | ICD-10-CM

## 2019-09-01 DIAGNOSIS — E785 Hyperlipidemia, unspecified: Secondary | ICD-10-CM

## 2019-09-01 NOTE — Progress Notes (Signed)
Patient ID: Peter Mullen, male   DOB: 03/13/1932, 86 y.o.   MRN: [id number]    Primary M.D.: Dr. Delfino Lovett Tisoivic  HPI: Peter Mullen is a 86 y.o. male who presents to the office today for a 5 month follow-up cardiology evaluation.    [name] has established CAD and in March 2001 underwent CABG revascularization surgery with a LIMA to the LAD, SVG to diagonal, SVG to the PLA, and SVG to the PDA vessel. Additional medical problems include hypertension, type 2 diabetes mellitus, mild obstructive sleep apnea not on CPAP therapy and obesity.   In October 2012 an nuclear perfusion study showed normal perfusion with a post stress ejection fraction of 62%. He has documented grade 1 diastolic dysfunction EA ratio 0.71.   A 2-D echo irevealed mild aortic sclerosis without stenosis, trace MR and trace TR. He has a history of mixed hyperlipidemia. He sees Dr. [name] for his primary medical care. Laboratory showed a normal chemistry profile, cholesterol 93, triglycerides, 110 HDL 31, and LDL 40 on his current medical regimen.  A two-year followup nuclear perfusion study on 06/30/2013 was low risk and demonstrated apical thinning with increased attenuation on resting images without significant ischemia. He did have mild LV dysfunction with an ejection fraction of 47% and focal apical inferior hypocontractility.    On 09/20/2013 a follow-up echo Doppler study showed an ejection fraction of 55% without diagnostic regional wall motion abnormalities although septal bounce was noted. He had grade 1 diastolic dysfunction. He had moderate calcification of his aortic valve without stenosis and there was moderate calcification of the mitral annulus with mildly calcified leaflets without significant regurgitation. His left atrium was mildly dilated. He had normal pulmonary pressures.  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  At that time, he had been on Toprol 100 mg daily and I  reduced this to 50 mg per day.  He has continued to take losartan 100 mg, HCTZ in addition to terazocin for hypertension.   In 2017 he had broken 2 of his molars and has been evaluated at the dental clinic at [facility].  He has been on chronic Plavix.  He had noticed that his blood pressure has been consistently elevated despite taking his vacations which include HCTZ 25 mg, losartan 100 mg, Toprol-XL 25 mg, and Hytrin 5 mg daily.  I added amlodipine 5 mg to his medical regimen for improved blood pressure control.  He had held his Plavix for dental work on his molars.   He is remaining active and typically bowls 2 times per week.  He has been having occasional pain down his left leg which may be attributable to spinal stenosis and in the past he had seen Dr. [name] for this, which had limited his walking.  He will need to have a molar extraction as well as an impacted was some tooth removed at the [facility], which is scheduled for later this month.  He underwent a nuclear perfusion study on 11/03/2016 in follow-up of his remote CABG surgery and this continued to be low risk and showed only a very small prior basal/mid inferoseptal defect without associated ischemia.  Ejection fraction was 55% and he had normal wall motion.    He underwent a nuclear perfusion study in April 2018, which remained low risk and showed a small defect in the basal inferoseptal and mid inferoseptal region consistent with probable scar without associated ischemia.  EF was 55% with normal wall motion.  Laboratory in June 2018 which showed total cholesterol 122, HDL 38, LDL 39, but triglycerides were elevated at 224.  Hemoglobin A1c was 6.2.  TSH was 3.25.     When I last saw him in July 2019 he remained stable and was without chest pain.  He  admitted to some exertional fatigue.  He was  found to have an elevated uric acid level and was just started on allopurinol.  He has been evaluated by Dr. [name] for left leg discomfort with  some left ankle swelling.  He denies any increasing shortness of breath or significant palpitations.    Since I last saw him he has continued to do well.  He still experiences occasional leg pain but this has improved.  He had undergone lower extremity Doppler assessment on May 06, 2018 which showed noncompressible ABIs bilaterally but he had normal toe brachial index bilaterally.  He has not had any acute gout flares.  He had recently fallen while bowling and banged his head.  He was hospitalized overnight and was found to have a small subdural hematoma.  He was assessed by Dr. [name] at that time recommended holding Plavix for at least 2 weeks and okay to continue aspirin.  He has been off Plavix since his head trauma May 26, 2018.    I last saw him in the office in January 2020 for follow-up Cardiologic evaluation.  At that time he was feeling well and was without chest pain he denied PND orthopnea.  He was sleeping well.  I recommended discontinuance of hydrochlorothiazide due to potential increased uric acid risk and suggested slight titration of spironolactone up to 25 mg daily.  He was evaluated in a telemedicine visit in May 2020.  At that time he was feeling well but continued to have issues with leg swelling.  He had noticed some improvement in his leg swelling with the increase in spironolactone dose but every day by midday he had developed recurrent edema.  He was using compression stockings.  He was bradycardic and his Toprol dose was reduced to 12.5 mg because of continued bilateral leg edema furosemide 20 mg was added to his regimen.  I last saw him in September 2020 at which time he was continuing to feel well and denied any anginal symptomatology.  He recently  noticed some mild tenderness in the breast region which had occurred on his increase spironolactone dosing.  He had been taking Spironolactone 25 mg twice a day.  Otherwise, he feels well.  He has had frequent awakenings and  has not been sleeping as well.  Remotely he had decided against pursuing CPAP therapy with remotely diagnosed sleep apnea well.    During his last evaluation, he developed some mild breast tenderness most likely resulting from spironolactone.  I recommended he reduce his spironolactone to 12.5 mg and further titrated furosemide.  With increased glycerides I recommended Vascepa 2 capsules twice a day.   Over the last several months, he has felt well.  His blood pressure has been stable.  He underwent a follow-up sleep study on May 10, 2019 which was notable for borderline sleep apnea with an AHI of 4.8/h; however, RDI was increased at 33.2/h.  AHI while supine was 6.8/h.  O2 desaturated to 89%.  AHI during REM sleep was 4.4/h.  He presents for follow-up evaluation.  Past medical history: CAD, status post CABG, hypertension, type 2 diabetes mellitus, mixed hyperlipidemia, obstructive sleep apnea, mild. He is status post removal of a  a basal cell cancer removed from his left cheek by Dr. Sarajane Jews.    Past surgical history notable for CABG surgery, cataract surgery 2005 prostate surgery 1989.  Current Outpatient Medications  Medication Sig Dispense Refill  . allopurinol (ZYLOPRIM) 300 MG tablet Take 300 mg by mouth daily.  5  . aspirin 81 MG tablet Take 81 mg by mouth every evening.     [name] atorvastatin (LIPITOR) 40 MG tablet Take 0.5 tablets (20 mg total) by mouth daily at 6 PM. 45 tablet 3  . BLACK ELDERBERRY PO Take 50 mg by mouth daily.    . cetirizine (ZYRTEC) 10 MG tablet Take 10 mg by mouth daily.    . Cholecalciferol (VITAMIN D3) 50 MCG (2000 UT) capsule Take 2,000 Units by mouth daily.    . colchicine 0.6 MG tablet Take 0.6 mg by mouth every Monday, Wednesday, and Friday.    . Cyanocobalamin (VITAMIN B12 PO) Take by mouth.    . folic acid (FOLVITE) 245 MCG tablet Take 400 mcg by mouth daily.    . furosemide (LASIX) 40 MG tablet Take 1 tablet (40 mg total) by mouth daily. 90 tablet 3  .  glucosamine-chondroitin 500-400 MG tablet Take 1 tablet by mouth 2 (two) times daily.    Vanessa Kick Ethyl (VASCEPA) 1 g CAPS Take 2 capsules (2 g total) by mouth 2 (two) times daily. 120 capsule 2  . metFORMIN (GLUCOPHAGE) 500 MG tablet Take 500 mg by mouth 2 (two) times daily with a meal.     . metoprolol succinate (TOPROL-XL) 25 MG 24 hr tablet Take 0.5 tablets (12.5 mg total) by mouth daily. 45 tablet 0  . montelukast (SINGULAIR) 10 MG tablet Take 10 mg by mouth at bedtime.     . nitroGLYCERIN (NITROSTAT) 0.4 MG SL tablet Place 1 tablet under the tongue daily as needed for chest pain.    [name] olmesartan (BENICAR) 40 MG tablet Take 1 tablet by mouth once daily 90 tablet 0  . omeprazole (PRILOSEC) 20 MG capsule Take 20 mg by mouth 2 (two) times daily before a meal.    . spironolactone (ALDACTONE) 25 MG tablet Take 0.5 tablets (12.5 mg total) by mouth daily. 180 tablet 3  . terazosin (HYTRIN) 5 MG capsule Take 5 mg by mouth at bedtime.     [name] testosterone (ANDROGEL) 50 MG/5GM GEL Place 5 g onto the skin 2 (two) times daily.     . vitamin E 400 UNIT capsule Take 400 Units by mouth daily.    [name] guaiFENesin (MUCINEX) 600 MG 12 hr tablet Take 600 mg by mouth 2 (two) times daily.     No current facility-administered medications for this visit.    He is widowed per history children 4 grandchildren. There is no tobacco use. He does drink occasional alcohol.  ROS General: Negative; No fevers, chills, or night sweats;  HEENT: Positive for cracks in his molars; No changes in vision or hearing, sinus congestion, difficulty swallowing Pulmonary: Negative; No cough, wheezing, shortness of breath, hemoptysis Cardiovascular: Negative; No chest pain, presyncope, syncope, palpitations GI: Negative; No nausea, vomiting, diarrhea, or abdominal pain GU: Negative; No dysuria, hematuria, or difficulty voiding Musculoskeletal: Positive for back pain with leg discomfort secondary to spinal stenosis; intermittent left  leg discomfort being evaluated by Dr. Lindwood Qua Rheumatologic: Gout Hematologic/Oncology: Negative; no easy bruising, bleeding Endocrine: Negative; no heat/cold intolerance; no diabetes Neuro: Negative; no changes in balance, headaches Skin: history of removal of prior basal cell carcinoma with  clear margins; No rashes or skin lesions Psychiatric: Negative; No behavioral problems, depression Sleep: mild sleep apnea, not on CPAP therapy; No  daytime sleepiness, hypersomnolence, bruxism, restless legs, hypnogognic hallucinations, no cataplexy Other comprehensive 14 point system review is negative.   PE BP 112/60   Pulse 82   Ht '5\' 6"'  (1.676 m)   Wt 204 lb 9.6 oz (92.8 kg)   BMI 33.02 kg/m    Repeat blood pressure by me was 142/80.  Wt Readings from Last 3 Encounters:  09/01/19 204 lb 9.6 oz (92.8 kg)  05/10/19 200 lb (90.7 kg)  04/17/19 209 lb (94.8 kg)   General: Alert, oriented, no distress.  Skin: normal turgor, no rashes, warm and dry HEENT: Normocephalic, atraumatic. Pupils equal round and reactive to light; sclera anicteric; extraocular muscles intact;  Nose without nasal septal hypertrophy Mouth/Parynx benign; Mallinpatti scale 3 Neck: No JVD, no carotid bruits; normal carotid upstroke Lungs: clear to ausculatation and percussion; no wheezing or rales Chest wall: without tenderness to palpitation Heart: PMI not displaced, RRR, s1 s2 normal, 1/6 systolic murmur, no diastolic murmur, no rubs, gallops, thrills, or heaves Abdomen: soft, nontender; no hepatosplenomehaly, BS+; abdominal aorta nontender and not dilated by palpation. Back: no CVA tenderness Pulses 2+ Musculoskeletal: full range of motion, normal strength, no joint deformities Extremities: Improvement in previous leg edema;no clubbing cyanosis, Homan's sign negative  Neurologic: grossly nonfocal; Cranial nerves grossly wnl Psychologic: Normal mood and affect  ECG (independently read by me): Normal sinus  rhythm with first-degree AV block and an isolated PVC 2015 checked intermittently at this time.  [employer],  September 2020 ECG (independently read by me): Sinus rhythm 82 bpm with PACs and blocked PACs.  January 2020 ECG (independently read by me): Sinus rhythm at 79 bpm with first-degree AV block with appeared normal at 248 ms.  Left bundle branch block and left axis deviation.  July 2019 ECG (independently read by me): Sinus rhythm with first-degree AV block, frequent PVCs with transient bigeminy.  QTc interval 451 ms.  PR interval 242 ms.  November 2018 ECG (independently read by me): Difficult to discern but probable sinus rhythm with PACs  October 2017 ECG (independently read by me): Normal sinus rhythm with first-degree AV block.  PAC.  Left bundle branch block.  July 2017 ECG (independently read by me): Sinus rhythm at 64 bpm.  PACs, nonspecific interventricular block.  First-degree AV block.  April 2017 ECG (independently read by me): Normal sinus rhythm at 64 bpm with occasional PVC and sinus arrhythmia.  First-degree AV block with a PR interval at 278 ms.  Blocked APC.  January 2017 ECG (independently read by me):  Sinus rhythm marked first-degree block.there also was a blocked APC.   He was a suggestion of possible 2nd degree Wenkebach block.  July 2016 ECG (independently read by me): Sinus rhythm with first-degree AV block with a PR interval at 248 ms.  May 2016 ECG (independently read by me): Probable 2nd degree Wenkebach block  November 2015 ECG (independently read by me): normal sinus rhythm with first-degree AV block with a PR interval at 230 milliseconds.  March 2015 ECG: (independently read by me) sinus rhythm at 73 beats per minute. First degree block with PR interval 248 ms. Isolated unifocal PVCs.   LABS:  BMP Latest Ref Rng & Units 01/09/2019 11/18/2018 05/26/2018  Glucose 65 - 99 mg/dL 176(H) 184(H) 228(H)  BUN 8 - 27 mg/dL '25 10 15  ' Creatinine 0.76 - 1.27  mg/dL  1.45(H) 1.07 1.27(H)  BUN/Creat Ratio 10 - 24 17 9(L) -  Sodium 134 - 144 mmol/L 141 146(H) 139  Potassium 3.5 - 5.2 mmol/L 4.2 4.6 3.7  Chloride 96 - 106 mmol/L 102 107(H) 103  CO2 20 - 29 mmol/L '21 23 23  ' Calcium 8.6 - 10.2 mg/dL 10.1 9.5 9.1   Hepatic Function Latest Ref Rng & Units 11/25/2015 10/28/2013  Total Protein 6.1 - 8.1 g/dL 6.6 7.5  Albumin 3.6 - 5.1 g/dL 4.2 3.9  AST 10 - 35 U/L 29 35  ALT 9 - 46 U/L 26 26  Alk Phosphatase 40 - 115 U/L 59 30(L)  Total Bilirubin 0.2 - 1.2 mg/dL 0.6 0.5   CBC Latest Ref Rng & Units 05/26/2018 11/25/2015 10/28/2013  WBC 4.0 - 10.5 K/uL 7.2 7.5 5.8  Hemoglobin 13.0 - 17.0 g/dL 13.9 14.6 15.2  Hematocrit 39.0 - 52.0 % 44.3 44.6 44.6  Platelets 150 - 400 K/uL 212 227 230   Lab Results  Component Value Date   TSH 3.25 11/25/2015    Lab Results  Component Value Date   HGBA1C 6.8 (H) 11/25/2015    Lipid Panel     Component Value Date/Time   CHOL 101 (L) 11/25/2015 1343   TRIG 170 (H) 11/25/2015 1343   HDL 42 11/25/2015 1343   CHOLHDL 2.4 11/25/2015 1343   VLDL 34 (H) 11/25/2015 1343   LDLCALC 25 11/25/2015 1343     RADIOLOGY: No results found.  IMPRESSION:  1. CAD in native artery   2. Essential hypertension   3. Hx of CABG   4. Hyperlipidemia with target LDL less than 70   5. OSA (obstructive sleep apnea)   6. Type 2 diabetes mellitus with complication, without long-term current use of insulin (HCC)    ASSESSMENT AND PLAN:  [name] is an 86 year old gentleman who is status post CABG revascularization surgery in 2001. At that time, a preoperative nuclear perfusion study was done prior to him undergoing potential donor bone marrow transplantation for his brother which was abnormal and led to his catheterization and ultimate detection of significant multivessel CAD. He was asymptomatic without chest pain.  He has a history of hypertension and  has been demonstrated to have transient had second-degree AV block, type I.  He  has had issues with leg edema and in the past gout.  When I last saw him, his blood pressure was increased.  At that time he also had some mild breast tenderness which I felt may be secondary to his spironolactone dose.  This has improved with his reduced dose at 12.5 mg.  He is on furosemide 40 mg daily, olmesartan 40 mg, Terazosin 5 mg bedtime in addition to the 25 mg of spironolactone.  Blood pressure today is stable.  He has continued to be on atorvastatin 40 mg and is now on Vascepa for his hyperlipidemia.  In August 2020 LDL was 32 at which time triglycerides were 311 resulting in initiation of Vascepa.  I reviewed his sleep study with him in detail.  He has borderline mild overall sleep apnea however he had significantly increased arousals with an RDI of 33.  There was mild sleep apnea with supine sleep.  He has been monitoring his oxygen saturations, and today this was 95%.  I discussed improved sleep hygiene.  With his mild sleep apnea with supine sleep I recommended provided counseled to avoid supine sleep.  At this point, he believes he is sleeping adequately and I would  not pursue CPAP initiation.  However if potential issues develop alternatives that can be initiated.  With his BMI of 33, we discussed weight loss and increased exercise.  He is diabetic on Metformin.  I will see him in 6 months for reevaluation or sooner as needed.   Troy Sine, MD, [facility]  09/03/2019 12:15 PM

## 2019-09-01 NOTE — Patient Instructions (Signed)
Medication Instructions:  CONTINUE WITH CURRENT MEDICATIONS *If you need a refill on your cardiac medications before your next appointment, please call your pharmacy*  Follow-Up: At [facility], you and your health needs are our priority.  As part of our continuing mission to provide you with exceptional heart care, we have created designated Provider Care Teams.  These Care Teams include your primary Cardiologist (physician) and Advanced Practice Providers (APPs -  Physician Assistants and Nurse Practitioners) who all work together to provide you with the care you need, when you need it.  Your next appointment:   6 month(s)  The format for your next appointment:   In Person  Provider:   Thomas Kelly, MD  

## 2019-09-03 ENCOUNTER — Encounter: Payer: Self-pay | Admitting: Cardiovascular Disease

## 2019-10-30 ENCOUNTER — Other Ambulatory Visit (HOSPITAL_BASED_OUTPATIENT_CLINIC_OR_DEPARTMENT_OTHER): Payer: Self-pay | Admitting: Cardiovascular Disease

## 2019-10-31 NOTE — Telephone Encounter (Signed)
Rx request sent to pharmacy.  

## 2019-12-26 ENCOUNTER — Telehealth: Payer: Self-pay | Admitting: Cardiovascular Disease

## 2019-12-26 NOTE — Telephone Encounter (Signed)
Pt c/o BP issue: STAT if pt c/o blurred vision, one-sided weakness or slurred speech  1. What are your last 5 BP readings?  Average of taking BP 28 times 95/71 since May 22 12/26/19 AM 103/70 3:40 PM 81/60 3:30 PM 78/55  2. Are you having any other symptoms (ex. Dizziness, headache, blurred vision, passed out)? Lightheaded   3. What is your BP issue? Briggs is calling stating he has been having hypotension consistently since 12/16/19. He states the average BP since that date is 95/71. Ante does occasionally experience symptoms of lightheadedness when this occurs, but hasn't passed out. He states he has lost 15-16 lbs in the past couple months coming down to 186 lbs and didn't know if it could be due to that.  He also states he is on Prednisone for back pain that goes to his legs. He was originally taking two 5 MG tablets once in AM & once in PM, but it caused his blood sugar to rise to 175-180 so he is now only taking one 5 MG tablet daily. Please advise.

## 2019-12-26 NOTE — Telephone Encounter (Signed)
Called and spoke with pt, he reports having some low blood pressures (listed in message below) he states that he has not passed out, but that he just feels a little "uneasy" Pt denies blurry vision, headache, shortness of breath, or chest pain.  He reports that he takes his BP daily anywhere between 7am- 12pm depending on when he wakes up for the day.   Pt currently takes toprol 12.5mg  daily, spironolactone 12.5mg  daily, terazosin 5mg  in the evening, olmesartan 40mg  in the evening, and lasix 40mg  daily  Pt reports that his blood sugars have recently been elevated and his Dr placed him on parexia as well as prednisone. He states that he was on the predinisone 5mg  twice a day for about a month to help treat his back pain and this caused his blood sugar to rise to 175-180 and has since dropped his dose to 5 mg daily and his blood sugar has been better.  He states that around mid day he "gets a little fuzzy and has to sit for a few minutes to let it pass." He reports that his lowest pressure was 74. And in the last few months he has lost 15lbs and weighs 186-187lbs. He states that he has had erratic pulse problems since 2003 but this has been good recently.  Advised pt to increase his fluid intake and to get up slowly while moving around. Able to schedule pt for an OV with Sande Rives on 12/28/19 at 9:45 am for evaluation. Also notified I would send this message to [facility] and our pharmacists for review of medications.   Advised that if he begins to have Shortness of breath, chest pain, or passes out, to call 911 immediately and be evaluated at his nearest emergency room. Pt verbalized understanding with all instructions and was thankful for the call. Had no other questions at this time.

## 2019-12-26 NOTE — Telephone Encounter (Signed)
Called and spoke with pt, reviewed pharmacists suggestions for holding terazosin. Pt states he is on the medication for prostate issues. But is agreeable to holding this medication until his office visit with [facility] on Thursday. Notified that if his BP gets low enough that he is symptomatic again for him to eat some crackers and drink cold water. He is agreeable to all of these recommendations and verbalized understanding. No other questions at this time. He is aware of his office visit on 12/28/19 at 9:45am

## 2019-12-26 NOTE — Telephone Encounter (Signed)
Have him stop taking the terazosin.  (Double check that he does not have any prostate issues that he is using it for - I don't see any in Epic).  His pressure should start to come up a little within a couple of days.  In the meantime, if it goes low enough that he feels symptomatic have him eat a couple of crackers and drink some cold water.  If after a week or two he is still having issues he needs to call back.

## 2019-12-26 NOTE — Progress Notes (Signed)
Cardiology Office Note:    Date:  12/28/2019   ID:  Peter Mullen, DOB 1931/10/06, MRN [id number]  PCP:  Haywood Pao, MD  Cardiologist:  Shelva Majestic, MD  Electrophysiologist:  None   Referring MD: Haywood Pao, MD   Chief Complaint: hypotension  History of Present Illness:    Peter Mullen is a 86 y.o. male with a history of CAD s/p CABG x4 in 09/1999 and low risk Myoview in 2012, 2014, and 2018; mild obstructive sleep apnea not on CPAP; hypertension; hyperlipidemia; type 2 diabetes mellitus; and obesity who is followed by Dr. Claiborne Billings and presents today for evaluation of hypotension.   Patient had a pre-operative nuclear perfusion study in 2001 prior to him undergoing potential donor bone marrow transplantation. This study was abnormal which led to a cardiac catheterization which showed signifciant multivessel disease. He ultimately underwent CABG x4 in 09/1999 with LIMA to LAD, SVG to Diag, SVG to PLA, and SVG to PDA. Since then, he has had low risk nuclear stress test in 04/2011, 06/2013, and 10/2016. Last nuclear stress test showed medium defect in the basal inferoseptal and mid inferoseptal location consistent with infarct but no ischemia. EF of 55% with normal wall motion. Most recent Echo in 08/2013 showed LVEF of 55% with no regional wall motion abnormalities but septal bounce noted, mild LVH, and grade 1 diastolic dysfunction.  At office visit in 2016, EKG showed probable 2nd degree AV block type 1. Therefore, Toprol-XL was discontinued from 100mg  daily to 50mg  daily. At visit in 2019, he noted some leg pain. He underwent lower extremity ultrasounds in 04/2018 and bilateral ABI indicated non-compressible lower extremity arteries but toe brachial index normal and waveform analysis demonstrated no significant arterial disease bilaterally. He has had lower extremity edema over the last few years and diuretics have been adjusted. He was admitted in 05/2018 after a mechanical fall  while bowling and was found to have a small subdural hematoma. Dr. [name] (Neurosurgery) recommended holding Plavix for 2 weeks. Patient was last seen by Dr. Claiborne Billings in 08/2019 at which time he was doing well from a cardiac standpoint.   Patient called our office on 12/26/2019 with concerns for low BP. Pharmacy reviewed patient's medications and recommended discontinue Terazosin. This appointment was made for further evaluation.   Patient here alone. Patient keeps a great log of his BP and heart. Systolic BP typically in the 100's to 100's but lately has been lower often in the 90's but sometimes in the 70's to 80's. No significant difference since stopping his Terazosin but he only stopped it a couple of days ago. He has occasional dizziness with this. He thinks this is likely when his systolic BP is 0000000. These episodes of dizziness are brief. No falls or syncope. He states he has occasional skipping of his heart beat but this is not new and he does not notice it much anymore. No sustained palpitations. No chest or shortness of breath. No orthopnea, PND, or lower extremity edema. No abnormal bleeding in urine or stools.   He has been staying active and has lost about 15-20 lbs over the last 4 months.  Past Medical History:  Diagnosis Date  . Coronary artery disease   . Diabetes mellitus without complication ([place])   . Hyperlipidemia   . Hypertension   . Skin cancer   . Stomach problems     Past Surgical History:  Procedure Laterality Date  . CORONARY ARTERY BYPASS GRAFT  2001  x4 LIMA-LAD, SVG-PDA  . [place]  . TONSILLECTOMY  1940  . TUMOR REMOVAL  1970   fatty tissue tumor  . VASECTOMY  1975    Current Medications: Current Meds  Medication Sig  . allopurinol (ZYLOPRIM) 300 MG tablet Take 300 mg by mouth daily.  [name] aspirin 81 MG tablet Take 81 mg by mouth every evening.   [name] atorvastatin (LIPITOR) 40 MG tablet Take 0.5 tablets (20 mg total) by mouth daily at 6 PM.  .  BLACK ELDERBERRY PO Take 50 mg by mouth daily.  . cetirizine (ZYRTEC) 10 MG tablet Take 10 mg by mouth daily.  . Cholecalciferol (VITAMIN D3) 50 MCG (2000 UT) capsule Take 2,000 Units by mouth daily.  . colchicine 0.6 MG tablet Take 0.6 mg by mouth every Monday, Wednesday, and Friday.  . Cyanocobalamin (VITAMIN B12 PO) Take by mouth.  . dapagliflozin propanediol (FARXIGA) 10 MG TABS tablet Take 1 tablet by mouth daily.  . folic acid (FOLVITE) Q000111Q MCG tablet Take 400 mcg by mouth daily.  . furosemide (LASIX) 40 MG tablet Take 1 tablet (40 mg total) by mouth daily.  [name] glucosamine-chondroitin 500-400 MG tablet Take 1 tablet by mouth 2 (two) times daily.  [name] guaiFENesin (MUCINEX) 600 MG 12 hr tablet Take 600 mg by mouth 2 (two) times daily.  Vanessa Kick Ethyl (VASCEPA) 1 g CAPS Take 2 capsules (2 g total) by mouth 2 (two) times daily.  . metFORMIN (GLUCOPHAGE) 500 MG tablet Take 500 mg by mouth 2 (two) times daily with a meal.   . metoprolol succinate (TOPROL-XL) 25 MG 24 hr tablet Take 0.5 tablets (12.5 mg total) by mouth daily.  . montelukast (SINGULAIR) 10 MG tablet Take 10 mg by mouth at bedtime.   . nitroGLYCERIN (NITROSTAT) 0.4 MG SL tablet Place 1 tablet under the tongue daily as needed for chest pain.  [name] olmesartan (BENICAR) 20 MG tablet Take 1 tablet (20 mg total) by mouth daily.  [name] omeprazole (PRILOSEC) 20 MG capsule Take 20 mg by mouth 2 (two) times daily before a meal.  . spironolactone (ALDACTONE) 25 MG tablet Take 0.5 tablets (12.5 mg total) by mouth daily.  [name] testosterone (ANDROGEL) 50 MG/5GM GEL Place 5 g onto the skin 2 (two) times daily.   . vitamin E 400 UNIT capsule Take 400 Units by mouth daily.  . [DISCONTINUED] olmesartan (BENICAR) 40 MG tablet Take 1 tablet by mouth once daily  . [DISCONTINUED] terazosin (HYTRIN) 5 MG capsule Take 5 mg by mouth at bedtime.      Allergies:   Codeine and Nsaids   Social History   Socioeconomic History  . Marital status: Married     Spouse name: Not on file  . Number of children: Not on file  . Years of education: Not on file  . Highest education level: Not on file  Occupational History  . Not on file  Tobacco Use  . Smoking status: Never Smoker  . Smokeless tobacco: Never Used  Substance and Sexual Activity  . Alcohol use: Yes    Alcohol/week: 2.0 standard drinks    Types: 2 Standard drinks or equivalent per week  . Drug use: Not on file  . Sexual activity: Not on file  Other Topics Concern  . Not on file  Social History Narrative  . Not on file   Social Determinants of Health   Financial Resource Strain:   . Difficulty of Paying Living Expenses:   Food Insecurity:   .  Worried About Charity fundraiser in the Last Year:   . Arboriculturist in the Last Year:   Transportation Needs:   . Film/video editor (Medical):   [name] Lack of Transportation (Non-Medical):   Physical Activity:   . Days of Exercise per Week:   . Minutes of Exercise per Session:   Stress:   . Feeling of Stress :   Social Connections:   . Frequency of Communication with Friends and Family:   . Frequency of Social Gatherings with Friends and Family:   . Attends Religious Services:   . Active Member of Clubs or Organizations:   . Attends Archivist Meetings:   [name] Marital Status:      Family History: The patient's family history includes Diabetes in his mother; Heart disease in his maternal grandfather and mother; Leukemia in his brother; Lung cancer in his father; Stroke in his mother and paternal grandfather.  ROS:   Please see the history of present illness.    All other systems reviewed and are negative.  EKGs/Labs/Other Studies Reviewed:    The following studies were reviewed today:  Echocardiogram 09/20/2013: Study Conclusions: - Left ventricle: The cavity size was normal. Wall thickness  was increased in a pattern of mild LVH. The estimated  ejection fraction was 55%. Although no diagnostic regional    wall motion abnormality was identified, this possibility  cannot be completely excluded on the basis of this study.  Septal bounce noted. Doppler parameters are consistent  with abnormal left ventricular relaxation (grade 1  diastolic dysfunction).  - Aortic valve: Trileaflet; moderately calcified leaflets.  There was no stenosis.  - Mitral valve: Moderately calcified annulus. Mildly  calcified leaflets . No significant regurgitation.  - Left atrium: The atrium was mildly dilated.  - Right ventricle: The cavity size was normal. Systolic  function was normal.  - Tricuspid valve: Peak RV-RA gradient: 62mm Hg (S).  - Pulmonary arteries: PA peak pressure: 60mm Hg (S).  - Inferior vena cava: The vessel was normal in size; the  respirophasic diameter changes were in the normal range (=  50%); findings are consistent with normal central venous  pressure.   Impressions:  - Normal LV size with mild LV hypertrophy. EF 55%. Septal  bounce noted. Normal RV size and systolic function. No  significant valvular abnormalities.  _______________  Leane Call 11/03/2016:  The left ventricular ejection fraction is normal (55-65%).  Nuclear stress EF: 55%.  There was no ST segment deviation noted during stress.  Defect 1: There is a medium defect of moderate severity present in the basal inferoseptal and mid inferoseptal location.  This is a low risk study.   Low risk stress nuclear study with small prior basal/mid inferoseptal infarct; no ischemia; EF 55 with normal wall motion. _______________  Lower Extremity Ultrasounds 05/04/2018: Summary:  Right: Resting right ankle-brachial index indicates noncompressible right lower extremity arteries.The right toe-brachial index is normal. Waveform analysis demonstrates no evidence of significant peripheral  arterial disease in the right lower extremity.   Left: Resting left ankle-brachial index indicates  noncompressible left  lower extremity arteries.The left toe-brachial index is normal. Waveform analysis demonstrates no evidence of significant peripheral  arterial disease in the left lower extremity.    EKG:  EKG ordered today. EKG personally reviewed and demonstrates sinus bradycardia, rate 59 bpm, with 2nd degree AV block type 1 and premature beat. Known LBBB.  Recent Labs: 01/09/2019: BUN 25; Creatinine, Ser 1.45; Potassium 4.2; Sodium  141  Recent Lipid Panel    Component Value Date/Time   CHOL 101 (L) 11/25/2015 1343   TRIG 170 (H) 11/25/2015 1343   HDL 42 11/25/2015 1343   CHOLHDL 2.4 11/25/2015 1343   VLDL 34 (H) 11/25/2015 1343   LDLCALC 25 11/25/2015 1343    Physical Exam:    Vital Signs: BP 112/64   Pulse (!) 59   Ht 5\' 6"  (1.676 m)   Wt 186 lb 1.6 oz (84.4 kg)   BMI 30.04 kg/m     Wt Readings from Last 3 Encounters:  12/28/19 186 lb 1.6 oz (84.4 kg)  09/01/19 204 lb 9.6 oz (92.8 kg)  05/10/19 200 lb (90.7 kg)     General: 86 y.o. male in no acute distress. HEENT: Normocephalic and atraumatic. Sclera clear. EOMs intact. Neck: Supple. No carotid bruits. No JVD. Heart: Irregular rhythm. Distinct S1 and S2. No murmurs, gallops, or rubs. Radial pulses 2+ and equal bilaterally. Lungs: No increased work of breathing. Clear to ausculation bilaterally. No wheezes, rhonchi, or rales.  Abdomen: Soft, non-distended, and non-tender to palpation. Bowel sounds present. Extremities: No lower extremity edema.    Skin: Warm and dry. Neuro: Alert and oriented x3. No focal deficits. Psych: Normal affect. Responds appropriately.  Assessment:    1. Hypotension, unspecified hypotension type   2. CAD in native artery   3. AV block, Mobitz 1   4. Hyperlipidemia, unspecified hyperlipidemia type   5. Obstructive sleep apnea   6. Type 2 diabetes mellitus with complication, without long-term current use of insulin (HCC)   7. Stage 3 chronic kidney disease, unspecified whether  stage 3a or 3b CKD     Plan:    Hypotension - History of hypertension but has been hypotensive lately. BP 112/64 in the office. He keeps a great log of his BP at home and he has had a lot of readings lately with systolic BP [id number]. Systolic BP as low as the mid 70's at time. He has some dizziness with this but no syncope. - Will decrease Olmesartan to 20mg  daily.  - Also recommended staying off Terazosin if OK with PCP/Urologist from a prostate standpoint.  - Will continue Toprol-XL 12.5mg  daily for now.  - Will continue Lasix and Spironolactone given history of lower extremity edema.   CAD s/p CABG - History of CABG x4 in 2001. He has had 3 low risk nuclear stress test since that time most recently in 2018.  - Stable. No angina.  - Continue aspirin, beta-blocker, and statin.  2nd Degree AV Block Type 1 ([place]) - History of 2nd degree AV block type 1. Noted on EKG today.  - Does not seem symptomatic with this. - Will continue low dose Toprol-XL for now but may consider stopping this in the future.  Hyperlipidemia - Most recent lipid pane from 02/2019 (per [facility]): Total Cholesterol 123, Triglycerides 311, HDL 29, LDL 32. - Continue Lipitor 40mg  daily.  - Labs followed by PCP.  Type 2 Diabetes Mellitus  - Hemoglobin A1c 8.8% in 09/2019. - Managed by PCP.   Obstructive Sleep Apnea - Sleep study in 04/2019 showed mild sleep apnea with supine sleep. Given patient was sleeping adequately, Dr. Claiborne Billings recommended avoiding supine sleep but did not pursue CPAP initiation at that time.  CKD Stage III - Most recent creatinine 1.90 in 09/2019 per [facility], up from 1.45 in 12/2018. - Patient has already left the office but will have him come back early next week to repeat a  BMET.  Disposition: Patient already has follow-up with Dr. Claiborne Billings arranged for 04/17/2020. Can keep this and follow-up with APP sooner if needed.   Medication Adjustments/Labs and Tests Ordered: Current medicines are reviewed at  length with the patient today.  Concerns regarding medicines are outlined above.  Orders Placed This Encounter  Procedures  . Basic metabolic panel  . EKG 12-Lead   Meds ordered this encounter  Medications  . olmesartan (BENICAR) 20 MG tablet    Sig: Take 1 tablet (20 mg total) by mouth daily.    Dispense:  30 tablet    Refill:  6    Patient Instructions  Medication Instructions:   DECREASE Olmesartan to 20 mg daily.  STOP Terazosin (unless otherwise directed by another physician).  *If you need a refill on your cardiac medications before your next appointment, please call your pharmacy*   Follow-Up: At [facility], you and your health needs are our priority.  As part of our continuing mission to provide you with exceptional heart care, we have created designated Provider Care Teams.  These Care Teams include your primary Cardiologist (physician) and Advanced Practice Providers (APPs -  Physician Assistants and Nurse Practitioners) who all work together to provide you with the care you need, when you need it.  We recommend signing up for the patient portal called "MyChart".  Sign up information is provided on this After Visit Summary.  MyChart is used to connect with patients for Virtual Visits (Telemedicine).  Patients are able to view lab/test results, encounter notes, upcoming appointments, etc.  Non-urgent messages can be sent to your provider as well.   To learn more about what you can do with MyChart, go to [name].    Your next appointment:    Keep scheduled follow-up appointment with Dr. Claiborne Billings in September.  The format for your next appointment:   In Person        Signed, Eppie Gibson  12/28/2019 1:08 PM    [place]

## 2019-12-28 ENCOUNTER — Other Ambulatory Visit: Payer: Self-pay

## 2019-12-28 ENCOUNTER — Encounter: Payer: Self-pay | Admitting: Student

## 2019-12-28 ENCOUNTER — Ambulatory Visit: Payer: Medicare Other | Admitting: Student

## 2019-12-28 VITALS — BP 112/64 | HR 59 | Ht 66.0 in | Wt 186.1 lb

## 2019-12-28 DIAGNOSIS — G4733 Obstructive sleep apnea (adult) (pediatric): Secondary | ICD-10-CM

## 2019-12-28 DIAGNOSIS — E118 Type 2 diabetes mellitus with unspecified complications: Secondary | ICD-10-CM

## 2019-12-28 DIAGNOSIS — I2510 Atherosclerotic heart disease of native coronary artery without angina pectoris: Secondary | ICD-10-CM | POA: Diagnosis not present

## 2019-12-28 DIAGNOSIS — E785 Hyperlipidemia, unspecified: Secondary | ICD-10-CM

## 2019-12-28 DIAGNOSIS — N183 Chronic kidney disease, stage 3 unspecified: Secondary | ICD-10-CM

## 2019-12-28 DIAGNOSIS — I959 Hypotension, unspecified: Secondary | ICD-10-CM

## 2019-12-28 DIAGNOSIS — I441 Atrioventricular block, second degree: Secondary | ICD-10-CM

## 2019-12-28 MED ORDER — OLMESARTAN MEDOXOMIL 20 MG PO TABS: 20.0000 mg | ORAL_TABLET | Freq: Every day | ORAL | 6 refills | Status: DC

## 2019-12-28 NOTE — Patient Instructions (Signed)
Medication Instructions:   DECREASE Olmesartan to 20 mg daily.  STOP Terazosin (unless otherwise directed by another physician).  *If you need a refill on your cardiac medications before your next appointment, please call your pharmacy*   Follow-Up: At [facility], you and your health needs are our priority.  As part of our continuing mission to provide you with exceptional heart care, we have created designated Provider Care Teams.  These Care Teams include your primary Cardiologist (physician) and Advanced Practice Providers (APPs -  Physician Assistants and Nurse Practitioners) who all work together to provide you with the care you need, when you need it.  We recommend signing up for the patient portal called "MyChart".  Sign up information is provided on this After Visit Summary.  MyChart is used to connect with patients for Virtual Visits (Telemedicine).  Patients are able to view lab/test results, encounter notes, upcoming appointments, etc.  Non-urgent messages can be sent to your provider as well.   To learn more about what you can do with MyChart, go to [name].    Your next appointment:    Keep scheduled follow-up appointment with Dr. Claiborne Billings in September.  The format for your next appointment:   In Person

## 2019-12-29 ENCOUNTER — Other Ambulatory Visit: Payer: Self-pay

## 2019-12-29 DIAGNOSIS — N183 Chronic kidney disease, stage 3 unspecified: Secondary | ICD-10-CM

## 2019-12-29 LAB — BASIC METABOLIC PANEL
BUN/Creatinine Ratio: 23 (ref 10–24)
BUN: 50 mg/dL — ABNORMAL HIGH (ref 8–27)
CO2: 18 mmol/L — ABNORMAL LOW (ref 20–29)
Calcium: 9.4 mg/dL (ref 8.6–10.2)
Chloride: 103 mmol/L (ref 96–106)
Creatinine, Ser: 2.13 mg/dL — ABNORMAL HIGH (ref 0.76–1.27)
GFR calc Af Amer: 31 mL/min/{1.73_m2} — ABNORMAL LOW (ref >59)
GFR calc non Af Amer: 27 mL/min/{1.73_m2} — ABNORMAL LOW (ref >59)
Glucose: 195 mg/dL — ABNORMAL HIGH (ref 65–99)
Potassium: 4.8 mmol/L (ref 3.5–5.2)
Sodium: 139 mmol/L (ref 134–144)

## 2020-01-15 ENCOUNTER — Other Ambulatory Visit: Payer: Self-pay | Admitting: Student

## 2020-01-15 DIAGNOSIS — Z79899 Other long term (current) drug therapy: Secondary | ICD-10-CM

## 2020-01-16 LAB — BASIC METABOLIC PANEL
BUN/Creatinine Ratio: 17 (ref 10–24)
BUN: 29 mg/dL — ABNORMAL HIGH (ref 8–27)
CO2: 18 mmol/L — ABNORMAL LOW (ref 20–29)
Calcium: 9.4 mg/dL (ref 8.6–10.2)
Chloride: 105 mmol/L (ref 96–106)
Creatinine, Ser: 1.75 mg/dL — ABNORMAL HIGH (ref 0.76–1.27)
GFR calc Af Amer: 40 mL/min/{1.73_m2} — ABNORMAL LOW (ref >59)
GFR calc non Af Amer: 34 mL/min/{1.73_m2} — ABNORMAL LOW (ref >59)
Glucose: 119 mg/dL — ABNORMAL HIGH (ref 65–99)
Potassium: 4.1 mmol/L (ref 3.5–5.2)
Sodium: 142 mmol/L (ref 134–144)

## 2020-04-10 ENCOUNTER — Other Ambulatory Visit: Payer: Self-pay | Admitting: Cardiovascular Disease

## 2020-04-17 ENCOUNTER — Ambulatory Visit: Payer: Medicare Other | Admitting: Cardiovascular Disease

## 2020-04-17 ENCOUNTER — Other Ambulatory Visit: Payer: Self-pay

## 2020-04-17 ENCOUNTER — Encounter: Payer: Self-pay | Admitting: Cardiovascular Disease

## 2020-04-17 DIAGNOSIS — Z79899 Other long term (current) drug therapy: Secondary | ICD-10-CM

## 2020-04-17 DIAGNOSIS — I2510 Atherosclerotic heart disease of native coronary artery without angina pectoris: Secondary | ICD-10-CM | POA: Diagnosis not present

## 2020-04-17 DIAGNOSIS — I10 Essential (primary) hypertension: Secondary | ICD-10-CM | POA: Diagnosis not present

## 2020-04-17 DIAGNOSIS — I447 Left bundle-branch block, unspecified: Secondary | ICD-10-CM

## 2020-04-17 DIAGNOSIS — E782 Mixed hyperlipidemia: Secondary | ICD-10-CM

## 2020-04-17 DIAGNOSIS — G4730 Sleep apnea, unspecified: Secondary | ICD-10-CM

## 2020-04-17 DIAGNOSIS — Z951 Presence of aortocoronary bypass graft: Secondary | ICD-10-CM | POA: Diagnosis not present

## 2020-04-17 DIAGNOSIS — I440 Atrioventricular block, first degree: Secondary | ICD-10-CM

## 2020-04-17 MED ORDER — ICOSAPENT ETHYL 1 G PO CAPS: 2.0000 g | ORAL_CAPSULE | Freq: Two times a day (BID) | ORAL | 2 refills | Status: AC

## 2020-04-17 NOTE — Progress Notes (Signed)
Patient ID: [name], male   DOB: [birth year], 86 y.o.   MRN: [id number]    Primary M.D.: Dr. Delfino Lovett Mullen  HPI: Peter Mullen is a 86 y.o. male who presents to the office today for a 7 month follow-up cardiology evaluation.    [name] has established CAD and in March 2001 underwent CABG revascularization surgery with a LIMA to the LAD, SVG to diagonal, SVG to the PLA, and SVG to the PDA vessel. Additional medical problems include hypertension, type 2 diabetes mellitus, mild obstructive sleep apnea not on CPAP therapy and obesity.   In October 2012 an nuclear perfusion study showed normal perfusion with a post stress ejection fraction of 62%. He has documented grade 1 diastolic dysfunction EA ratio 0.71.   A 2-D echo irevealed mild aortic sclerosis without stenosis, trace MR and trace TR. He has a history of mixed hyperlipidemia. He sees Dr. Vianne Mullen for his primary medical care. Laboratory showed a normal chemistry profile, cholesterol 93, triglycerides, 110 HDL 31, and LDL 40 on his current medical regimen.  A two-year followup nuclear perfusion study on 06/30/2013 was low risk and demonstrated apical thinning with increased attenuation on resting images without significant ischemia. He did have mild LV dysfunction with an ejection fraction of 47% and focal apical inferior hypocontractility.    On 09/20/2013 a follow-up echo Doppler study showed an ejection fraction of 55% without diagnostic regional wall motion abnormalities although septal bounce was noted. He had grade 1 diastolic dysfunction. He had moderate calcification of his aortic valve without stenosis and there was moderate calcification of the mitral annulus with mildly calcified leaflets without significant regurgitation. His left atrium was mildly dilated. He had normal pulmonary pressures.  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  At that time, he had been on Toprol 100 mg daily and I  reduced this to 50 mg per day.  He has continued to take losartan 100 mg, HCTZ in addition to terazocin for hypertension.   In 2017 he had broken 2 of his molars and has been evaluated at the dental [facility] at [facility].  He has been on chronic Plavix.  He had noticed that his blood pressure has been consistently elevated despite taking his vacations which include HCTZ 25 mg, losartan 100 mg, Toprol-XL 25 mg, and Hytrin 5 mg daily.  I added amlodipine 5 mg to his medical regimen for improved blood pressure control.  He had held his Plavix for dental work on his molars.   He is remaining active and typically bowls 2 times per week.  He has been having occasional pain down his left leg which may be attributable to spinal stenosis and in the past he had seen Dr. Cleda Mullen for this, which had limited his walking.  He will need to have a molar extraction as well as an impacted was some tooth removed at the [facility], which is scheduled for later this month.  He underwent a nuclear perfusion study on 11/03/2016 in follow-up of his remote CABG surgery and this continued to be low risk and showed only a very small prior basal/mid inferoseptal defect without associated ischemia.  Ejection fraction was 55% and he had normal wall motion.    He underwent a nuclear perfusion study in April 2018, which remained low risk and showed a small defect in the basal inferoseptal and mid inferoseptal region consistent with probable scar without associated ischemia.  EF was 55% with normal wall motion.  Laboratory in June 2018 which showed total cholesterol 122, HDL 38, LDL 39, but triglycerides were elevated at 224.  Hemoglobin A1c was 6.2.  TSH was 3.25.     When I last saw him in July 2019 he remained stable and was without chest pain.  He  admitted to some exertional fatigue.  He was  found to have an elevated uric acid level and was just started on allopurinol.  He has been evaluated by Dr. Gladstone Mullen for left leg discomfort with  some left ankle swelling.  He denies any increasing shortness of breath or significant palpitations.    Since I last saw him he has continued to do well.  He still experiences occasional leg pain but this has improved.  He had undergone lower extremity Doppler assessment on May 06, 2018 which showed noncompressible ABIs bilaterally but he had normal toe brachial index bilaterally.  He has not had any acute gout flares.  He had recently fallen while bowling and banged his head.  He was hospitalized overnight and was found to have a small subdural hematoma.  He was assessed by Dr. Christella Mullen at that time recommended holding Plavix for at least 2 weeks and okay to continue aspirin.  He has been off Plavix since his head trauma May 26, 2018.    I saw him in the office in January 2020 for follow-up Cardiologic evaluation.  At that time he was feeling well and was without chest pain he denied PND orthopnea.  He was sleeping well.  I recommended discontinuance of hydrochlorothiazide due to potential increased uric acid risk and suggested slight titration of spironolactone up to 25 mg daily.  He was evaluated in a telemedicine visit in May 2020.  At that time he was feeling well but continued to have issues with leg swelling.  He had noticed some improvement in his leg swelling with the increase in spironolactone dose but every day by midday he had developed recurrent edema.  He was using compression stockings.  He was bradycardic and his Toprol dose was reduced to 12.5 mg because of continued bilateral leg edema furosemide 20 mg was added to his regimen.  I saw him in September 2020 at which time he was continuing to feel well and denied any anginal symptomatology.  He recently  noticed some mild tenderness in the breast region which had occurred on his increase spironolactone dosing.  He had been taking Spironolactone 25 mg twice a day.  Otherwise, he feels well.  He has had frequent awakenings and has not  been sleeping as well.  Remotely he had decided against pursuing CPAP therapy with remotely diagnosed sleep apnea well.    He developed some mild breast tenderness most likely resulting from spironolactone.  I recommended he reduce his spironolactone to 12.5 mg and further titrated furosemide.  With increased glycerides I recommended Vascepa 2 capsules twice a day.   I last saw him I last saw him in February 2021.  Over the prior months, he has felt well.  His blood pressure has been stable.  He underwent a follow-up sleep study on May 10, 2019 which was notable for borderline sleep apnea with an AHI of 4.8/h; however, RDI was increased at 33.2/h.  AHI while supine was 6.8/h.  O2 desaturated to 89%.  AHI during REM sleep was 4.4/h.  He has had issues with elevated triglycerides and he was on Vascepa in addition to atorvastatin 40 mg.  We discussed improved sleep hygiene.  Over  the past several months, he has continued eating well.  He apparently has only been taking Vascepa 1 g twice a day instead of 2 g twice a day and atorvastatin 20 mg.  Repeat laboratory by Dr. Wylene Simmer can 2021 which showed total cholesterol 144, triglycerides elevated at 368.  LDL cholesterol was 35.  Chemistry was normal.  Uric acid was 5.8.  PSA 1.98.  He denies any chest pain or shortness of breath.  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  He is sleeping well and did not pursue CPAP.  Past medical history: CAD, status post CABG, hypertension, type 2 diabetes mellitus, mixed hyperlipidemia, obstructive sleep apnea, mild. He is status post removal of a a basal cell cancer removed from his left cheek by Dr. Irene Limbo.    Past surgical history notable for CABG surgery, cataract surgery 2005 prostate surgery 1989.  Current Outpatient Medications  Medication Sig Dispense Refill  . allopurinol (ZYLOPRIM) 300 MG tablet Take 300 mg by mouth daily.  5  . aspirin 81  MG tablet Take 81 mg by mouth every evening.     [name] atorvastatin (LIPITOR) 40 MG tablet Take 0.5 tablets (20 mg total) by mouth daily at 6 PM. 45 tablet 3  . BLACK ELDERBERRY PO Take 50 mg by mouth daily.    . cetirizine (ZYRTEC) 10 MG tablet Take 10 mg by mouth daily.    . Cholecalciferol (VITAMIN D3) 50 MCG (2000 UT) capsule Take 2,000 Units by mouth daily.    . colchicine 0.6 MG tablet Take 0.6 mg by mouth every Monday, Wednesday, and Friday.    . Cyanocobalamin (VITAMIN B12 PO) Take by mouth.    . Empagliflozin-metFORMIN HCl 12.11-998 MG TABS Take 12.5 mg by mouth.    . folic acid (FOLVITE) 800 MCG tablet Take 400 mcg by mouth daily.    . furosemide (LASIX) 40 MG tablet Take 1 tablet by mouth once daily 90 tablet 0  . glucosamine-chondroitin 500-400 MG tablet Take 1 tablet by mouth 2 (two) times daily.    [name] guaiFENesin (MUCINEX) 600 MG 12 hr tablet Take 600 mg by mouth 2 (two) times daily.    [name] icosapent Ethyl (VASCEPA) 1 g capsule Take 2 capsules (2 g total) by mouth 2 (two) times daily. 120 capsule 2  . metFORMIN (GLUCOPHAGE) 500 MG tablet Take 500 mg by mouth 2 (two) times daily with a meal.     . metoprolol succinate (TOPROL-XL) 25 MG 24 hr tablet Take 1/2 (one-half) tablet by mouth once daily 45 tablet 0  . montelukast (SINGULAIR) 10 MG tablet Take 10 mg by mouth at bedtime.     . nitroGLYCERIN (NITROSTAT) 0.4 MG SL tablet Place 1 tablet under the tongue daily as needed for chest pain.    [name] olmesartan (BENICAR) 20 MG tablet Take 1 tablet (20 mg total) by mouth daily. 30 tablet 6  . omeprazole (PRILOSEC) 20 MG capsule Take 20 mg by mouth 2 (two) times daily before a meal.    . testosterone (ANDROGEL) 50 MG/5GM GEL Place 5 g onto the skin 2 (two) times daily.     . vitamin E 400 UNIT capsule Take 400 Units by mouth daily.     No current facility-administered medications for this visit.    He is widowed per history children 4 grandchildren. There is no tobacco use. He does drink  occasional alcohol.  ROS General: Negative; No  fevers, chills, or night sweats;  HEENT: Positive for cracks in his molars; No changes in vision or hearing, sinus congestion, difficulty swallowing Pulmonary: Negative; No cough, wheezing, shortness of breath, hemoptysis Cardiovascular: Negative; No chest pain, presyncope, syncope, palpitations GI: Negative; No nausea, vomiting, diarrhea, or abdominal pain GU: Negative; No dysuria, hematuria, or difficulty voiding Musculoskeletal: Positive for back pain with leg discomfort secondary to spinal stenosis; intermittent left leg discomfort being evaluated by Dr. Lindwood Qua Rheumatologic: Gout Hematologic/Oncology: Negative; no easy bruising, bleeding Endocrine: Negative; no heat/cold intolerance; no diabetes Neuro: Negative; no changes in balance, headaches Skin: history of removal of prior basal cell carcinoma with clear margins; No rashes or skin lesions Psychiatric: Negative; No behavioral problems, depression Sleep: mild sleep apnea, not on CPAP therapy; No  daytime sleepiness, hypersomnolence, bruxism, restless legs, hypnogognic hallucinations, no cataplexy Other comprehensive 14 point system review is negative.   PE BP 126/62   Pulse 82   Ht 5' 6.5" (1.689 m)   Wt 192 lb 9.6 oz (87.4 kg)   SpO2 95%   BMI 30.62 kg/m    Repeat blood pressure by me was 128/70  Wt Readings from Last 3 Encounters:  04/17/20 192 lb 9.6 oz (87.4 kg)  12/28/19 186 lb 1.6 oz (84.4 kg)  09/01/19 204 lb 9.6 oz (92.8 kg)   General: Alert, oriented, no distress.  Skin: normal turgor, no rashes, warm and dry HEENT: Normocephalic, atraumatic. Pupils equal round and reactive to light; sclera anicteric; extraocular muscles intact;  Nose without nasal septal hypertrophy Mouth/Parynx benign; Mallinpatti scale  Neck: No JVD, no carotid bruits; normal carotid upstroke Lungs: clear to ausculatation and percussion; no wheezing or rales Chest wall: without  tenderness to palpitation Heart: PMI not displaced, RRR, s1 s2 normal, 1/6 systolic murmur, no diastolic murmur, no rubs, gallops, thrills, or heaves Abdomen: soft, nontender; no hepatosplenomehaly, BS+; abdominal aorta nontender and not dilated by palpation. Back: no CVA tenderness Pulses 2+ Musculoskeletal: full range of motion, normal strength, no joint deformities Extremities: no clubbing cyanosis or edema, Homan's sign negative  Neurologic: grossly nonfocal; Cranial nerves grossly wnl Psychologic: Normal mood and affect   ECG (independently read by me): Sinus rhythm with PACs, first-degree AV block with PR interval 288 ms.  Left bundle branch block.  February 2021 ECG (independently read by me): Normal sinus rhythm with first-degree AV block and an isolated PVC 2015 checked intermittently at this time.  Directors Peter Mullen,  September 2020 ECG (independently read by me): Sinus rhythm 82 bpm with PACs and blocked PACs.  January 2020 ECG (independently read by me): Sinus rhythm at 79 bpm with first-degree AV block with appeared normal at 248 ms.  Left bundle branch block and left axis deviation.  July 2019 ECG (independently read by me): Sinus rhythm with first-degree AV block, frequent PVCs with transient bigeminy.  QTc interval 451 ms.  PR interval 242 ms.  November 2018 ECG (independently read by me): Difficult to discern but probable sinus rhythm with PACs  October 2017 ECG (independently read by me): Normal sinus rhythm with first-degree AV block.  PAC.  Left bundle branch block.  July 2017 ECG (independently read by me): Sinus rhythm at 64 bpm.  PACs, nonspecific interventricular block.  First-degree AV block.  April 2017 ECG (independently read by me): Normal sinus rhythm at 64 bpm with occasional PVC and sinus arrhythmia.  First-degree AV block with a PR interval at 278 ms.  Blocked APC.  January 2017 ECG (independently read by me):  Sinus rhythm marked first-degree block.there  also was a blocked APC.   He was a suggestion of possible 2nd degree Wenkebach block.  July 2016 ECG (independently read by me): Sinus rhythm with first-degree AV block with a PR interval at 248 ms.  May 2016 ECG (independently read by me): Probable 2nd degree Wenkebach block  November 2015 ECG (independently read by me): normal sinus rhythm with first-degree AV block with a PR interval at 230 milliseconds.  March 2015 ECG: (independently read by me) sinus rhythm at 73 beats per minute. First degree block with PR interval 248 ms. Isolated unifocal PVCs.   LABS:  BMP Latest Ref Rng & Units 01/15/2020 12/29/2019 01/09/2019  Glucose 65 - 99 mg/dL 119(H) 195(H) 176(H)  BUN 8 - 27 mg/dL 29(H) 50(H) 25  Creatinine 0.76 - 1.27 mg/dL 1.75(H) 2.13(H) 1.45(H)  BUN/Creat Ratio 10 - _0 Sodium 134 - 144 mmol/L 142 139 141  Potassium 3.5 - 5.2 mmol/L 4.1 4.8 4.2  Chloride 96 - 106 mmol/L 105 103 102  CO2 20 - 29 mmol/L 18(L) 18(L) 21  Calcium 8.6 - 10.2 mg/dL 9.4 9.4 10.1   Hepatic Function Latest Ref Rng & Units 11/25/2015 10/28/2013  Total Protein 6.1 - 8.1 g/dL 6.6 7.5  Albumin 3.6 - 5.1 g/dL 4.2 3.9  AST 10 - 35 U/L 29 35  ALT 9 - 46 U/L 26 26  Alk Phosphatase 40 - 115 U/L 59 30(L)  Total Bilirubin 0.2 - 1.2 mg/dL 0.6 0.5   CBC Latest Ref Rng & Units 05/26/2018 11/25/2015 10/28/2013  WBC 4.0 - 10.5 K/uL 7.2 7.5 5.8  Hemoglobin 13.0 - 17.0 g/dL 13.9 14.6 15.2  Hematocrit 39 - 52 % 44.3 44.6 44.6  Platelets 150 - 400 K/uL 212 227 230   Lab Results  Component Value Date   TSH 3.25 11/25/2015    Lab Results  Component Value Date   HGBA1C 6.8 (H) 11/25/2015    Lipid Panel     Component Value Date/Time   CHOL 101 (L) 11/25/2015 1343   TRIG 170 (H) 11/25/2015 1343   HDL 42 11/25/2015 1343   CHOLHDL 2.4 11/25/2015 1343   VLDL 34 (H) 11/25/2015 1343   LDLCALC 25 11/25/2015 1343     RADIOLOGY: No results found.  IMPRESSION:  1. CAD in native artery   2. Hx of CABG   3.  Essential hypertension   4. Mixed hyperlipidemia   5. First degree AV block   6. Left bundle branch block   7. Mild sleep apnea   8. Medication management    ASSESSMENT AND PLAN:  [name] is an 86 year old gentleman who is status post CABG revascularization surgery in 2001. At that time, a preoperative nuclear perfusion study was done prior to him undergoing potential donor bone marrow transplantation for his brother which was abnormal and led to his catheterization and ultimate detection of significant multivessel CAD. He was asymptomatic without chest pain.  He has a history of hypertension and  has been demonstrated to have transient had second-degree AV block, type I.  He has had issues with leg edema and in the past gout.  Previously had been on spironolactone but this was ultimately discontinued due to breast tenderness.  His blood pressure today is stable on his medical regimen consisting of furosemide 40 mg, metoprolol succinate 12.5 mg daily, olmesartan 20 mg.  I reviewed laboratory from Dr. Osborne Casco.  He continues have significant triglyceride elevation.  LDL was  35.  I have recommended he increase Vascepa to 2 capsules twice a day.  He believes he is sleeping well and was previously found to have mild overall sleep apnea with an increased respiratory disturbance index.  He has been avoiding sleeping on his back and feels he is sleeping well.  He is diabetic on Metformin.  He is not having any anginal symptoms.  He has stable first-degree block with left bundle branch block.  I will see him in 6 months for reevaluation.  Troy Sine, MD, [facility]  04/18/2020 3:33 PM

## 2020-04-17 NOTE — Patient Instructions (Signed)
Medication Instructions:  INCREASE YOUR VASCEPA TO 2 CAPS TWICE A DAY  *If you need a refill on your cardiac medications before your next appointment, please call your pharmacy*    Follow-Up: At [facility], you and your health needs are our priority.  As part of our continuing mission to provide you with exceptional heart care, we have created designated Provider Care Teams.  These Care Teams include your primary Cardiologist (physician) and Advanced Practice Providers (APPs -  Physician Assistants and Nurse Practitioners) who all work together to provide you with the care you need, when you need it.  We recommend signing up for the patient portal called "MyChart".  Sign up information is provided on this After Visit Summary.  MyChart is used to connect with patients for Virtual Visits (Telemedicine).  Patients are able to view lab/test results, encounter notes, upcoming appointments, etc.  Non-urgent messages can be sent to your provider as well.   To learn more about what you can do with MyChart, go to [name].    Your next appointment:   6 month(s)  The format for your next appointment:   In Person  Provider:   Shelva Majestic, MD

## 2020-04-18 ENCOUNTER — Encounter: Payer: Self-pay | Admitting: Cardiovascular Disease

## 2020-07-01 ENCOUNTER — Other Ambulatory Visit: Payer: Self-pay | Admitting: Cardiovascular Disease

## 2020-09-03 ENCOUNTER — Telehealth: Payer: Self-pay

## 2020-09-03 NOTE — Telephone Encounter (Signed)
Patient walked into [facility] to leave a message with Dr. Claiborne Billings regarding a decreased heart rate. Per note from patient heart rate has been 37-53bpm and on 2/7 heart rate in the 70's   Returned call to patient who states that at 5:30 Monday morning he woke up with an alert from his watch that stated his heart rate was in <40 for 10 mins. Patient states he immediately got up and his heart rate was 53. Patient states that the last two days his heart rate has been in the 70's. Patient denies syncope, dizziness, chest pain, shortness of breath or any other symptoms. Patient states he only had that one episode of slow heart rate while sleeping. Patient does state that he takes 2 Tylenol (500mg ) each, 50 mg benadryl, and 5 mg of melatonin to go to sleep every night and wanted to check and make sure that those medications were safe to take with his other medications and not causing his heart rate to slow down.   Advised patient that I would forward message to PharmD to advise on medications, and that I would forward message to Dr. Claiborne Billings as well. Patient verbalized understanding.

## 2020-09-04 NOTE — Telephone Encounter (Signed)
His combination (benadryl, tylenol, melatonin) for sleep should not cause low heart rate.   Noted he takes 2 prescribed medication that can decrease his heart rate, allopurinol and metoprolol.  Recommendation:  1. Most recent renal function, patient should be at reduced allopurinol dose. Patient should contact prescriber to decrease dose as needed..  2. HOLD metoprolol dose TODAY, then resume tomorrow at normal dose of 12.5mg  daily.  3. Patient due to schedule f/u with DR Claiborne Billings (6 months f/u due March/2022). Please schedule.

## 2020-09-04 NOTE — Telephone Encounter (Signed)
Spoke with patient, advised patient of Pharmacists recommendations. Patient states he has had no reoccurrence of decreased heart rate. Patient reports feeling very well, and reports normal blood pressure and heart rate (unable to provide readings at time of call).   Dr. Claiborne Billings has no availability for appointments in March- appointment made on 09/16/20 at 3:15pm for 6 Month follow up with Sande Rives PA who he has seen in the past.   Advised patient to call back to office with any issues, questions, or concerns.   Patient verbalized understanding.

## 2020-09-15 NOTE — Progress Notes (Unsigned)
Cardiology Office Note:    Date:  09/18/2020   ID:  Peter Mullen, DOB [birth year], MRN [id number]  PCP:  Peter Pao, MD  Cardiologist:  Peter Majestic, MD  Electrophysiologist:  None   Referring MD: Peter Pao, MD   Chief Complaint: follow-up of CAD  History of Present Illness:    Peter Mullen is a 87 y.o. male with a history of with a history of CAD s/p CABG x4 in 09/1999 and low risk Myoview in 2012, 2014, and 2018; mild obstructive sleep apnea not on CPAP; hypertension; hyperlipidemia; type 2 diabetes mellitus; and obesity who is followed by Peter Mullen and presents today for routine follow-up.   Patient had a pre-operative nuclear perfusion study in 2001 prior to him undergoing potential donor bone marrow transplantation. This study was abnormal which led to a cardiac catheterization which showed signifciant multivessel disease. He ultimately underwent CABG x4 in 09/1999 with LIMA to LAD, SVG to Diag, SVG to PLA, and SVG to PDA. Since then, he has had low risk nuclear stress test in 04/2011, 06/2013, and 10/2016. Last nuclear stress test showed medium defect in the basal inferoseptal and mid inferoseptal location consistent with infarct but no ischemia. EF of 55% with normal wall motion. Most recent Echo in 08/2013 showed LVEF of 55% with no regional wall motion abnormalities but septal bounce noted, mild LVH, and grade 1 diastolic dysfunction.  At office visit in 2016, EKG showed probable 2nd degree AV block type 1. Therefore, Toprol-XL was decreased from 100mg  daily to 50mg  daily. At visit in 2019, he noted some leg pain. He underwent lower extremity ultrasounds in 04/2018 and bilateral ABI indicated non-compressible lower extremity arteries but toe brachial index normal and waveform analysis demonstrated no significant arterial disease bilaterally. He has had lower extremity edema over the last few years and diuretics have been adjusted.   I saw the patient in 12/2019 for  evaluation of hypotension with systolic BP as low as the 70's at home. He reports some occasional dizziness with this but was mostly symptomatic. No syncope. His Olmesartan was decreased. It was also recommended that he stay off Terazosin.  He was last seen by Peter Mullen in 03/2020 at which time he was doing well from a cardiac standpoint.  He was recently seen in the ED on 09/16/2020 for vision changes and lightheadedness/dizziness. Symptoms spontaneously resolved on their own after several hours. Work-up unremarkable. EKG showed sinus arrhythmia with rates in the 70's and known LBBB. Head CT, brain MRI, and head MRA showed no acute findings. Patient was felt to be stable for discharge from the ED and was advised to follow-up with PCP and Ophthalmologist.   Patient presents today for follow-up. Other than this dizziness earlier this week he has been doing well. He occasionally has brief chest discomfort at rest that he relates to GERD/"gastro" problems. This is not new and is stable. He takes Omeprazole for his GERD. He denies any exertional chest pain. He walks on the treadmill and goes bowling and denies any chest pain with these activities. No shortness of breath. No orthopnea or PND. No significant edema. No other recent episodes of dizziness/lightheadedness. No syncope.   He brings in a very detailed log his his BP, heart, O2 sat, weights. Weight stable at home and coming down from earlier this month. BP and heart rate well controlled. On the night of 09/02/2020, he did received an alert on his apple watch that his heart rate dropped  to 37 for about 10 minutes. This occurred while he was sleeping. He states heart rates have dropped in the 40's before while sleeping but he wanted to let us know since they dropped to the 30's. No episodes of bradycardia during the day.  Past Medical History:  Diagnosis Date  . Coronary artery disease   . Diabetes mellitus without complication ([place])   . Hyperlipidemia    . Hypertension   . Skin cancer   . Stomach problems     Past Surgical History:  Procedure Laterality Date  . CORONARY ARTERY BYPASS GRAFT  2001   x4 LIMA-LAD, SVG-PDA  . [place]  . TONSILLECTOMY  1940  . TUMOR REMOVAL  1970   fatty tissue tumor  . VASECTOMY  1975    Current Medications: Current Meds  Medication Sig  . acetaminophen (TYLENOL) 650 MG CR tablet Take 1,300 mg by mouth at bedtime.  [name] allopurinol (ZYLOPRIM) 300 MG tablet Take 300 mg by mouth daily.  [name] aspirin 81 MG tablet Take 81 mg by mouth every evening.   [name] atorvastatin (LIPITOR) 40 MG tablet Take 0.5 tablets (20 mg total) by mouth daily at 6 PM.  . BIOTIN PO Take 1 tablet by mouth daily.  [name] BLACK ELDERBERRY PO Take 50 mg by mouth 3 (three) times a week.  . cetirizine (ZYRTEC) 10 MG tablet Take 10 mg by mouth at bedtime.  . Cholecalciferol (VITAMIN D3) 50 MCG (2000 UT) capsule Take 2,000 Units by mouth 3 (three) times a week.  . colchicine 0.6 MG tablet Take 0.6 mg by mouth daily as needed (gout flares).  . Cyanocobalamin (VITAMIN B12 PO) Take 1,000 mg by mouth 3 (three) times a week.  . diphenhydrAMINE (BENADRYL) 25 mg capsule Take 25 mg by mouth at bedtime as needed for sleep.  [name] doxycycline (VIBRAMYCIN) 100 MG capsule Take 100 mg by mouth See admin instructions. Bid x 5 days  . empagliflozin (JARDIANCE) 25 MG TABS tablet Take 12.5 mg by mouth daily.  . fluticasone (FLONASE) 50 MCG/ACT nasal spray Place 1 spray into both nostrils every evening.  . folic acid (FOLVITE) 505 MCG tablet Take 400 mcg by mouth 3 (three) times a week.  . furosemide (LASIX) 40 MG tablet Take 1 tablet by mouth once daily (Patient taking differently: Take 40 mg by mouth daily.)  . guaiFENesin (MUCINEX) 600 MG 12 hr tablet Take 600 mg by mouth daily.  [name] icosapent Ethyl (VASCEPA) 1 g capsule Take 2 capsules (2 g total) by mouth 2 (two) times daily.  . Melatonin 10 MG TABS Take 10 mg by mouth at bedtime.  . metFORMIN  (GLUCOPHAGE) 500 MG tablet Take 500 mg by mouth 2 (two) times daily with a meal.   . metoprolol succinate (TOPROL-XL) 25 MG 24 hr tablet Take 1/2 (one-half) tablet by mouth once daily (Patient taking differently: Take 12.5 mg by mouth daily.)  . montelukast (SINGULAIR) 10 MG tablet Take 10 mg by mouth at bedtime.   . Multiple Vitamins-Minerals (ZINC PO) Take 1 tablet by mouth 3 (three) times a week.  . nitroGLYCERIN (NITROSTAT) 0.4 MG SL tablet Place 1 tablet under the tongue daily as needed for chest pain.  [name] olmesartan (BENICAR) 20 MG tablet Take 1 tablet (20 mg total) by mouth daily. (Patient taking differently: Take 20 mg by mouth every evening.)  . omeprazole (PRILOSEC) 20 MG capsule Take 20 mg by mouth 2 (two) times daily before a meal.  . oxymetazoline (AFRIN)  0.05 % nasal spray Place 1 spray into both nostrils at bedtime.  [name] testosterone (ANDROGEL) 50 MG/5GM GEL Place 5 g onto the skin 2 (two) times daily.   . vitamin E 400 UNIT capsule Take 400 Units by mouth 3 (three) times a week.     Allergies:   Codeine and Nsaids   Social History   Socioeconomic History  . Marital status: Married    Spouse name: Not on file  . Number of children: Not on file  . Years of education: Not on file  . Highest education level: Not on file  Occupational History  . Not on file  Tobacco Use  . Smoking status: Never Smoker  . Smokeless tobacco: Never Used  Substance and Sexual Activity  . Alcohol use: Yes    Alcohol/week: 2.0 standard drinks    Types: 2 Standard drinks or equivalent per week  . Drug use: Not on file  . Sexual activity: Not on file  Other Topics Concern  . Not on file  Social History Narrative  . Not on file   Social Determinants of Health   Financial Resource Strain: Not on file  Food Insecurity: Not on file  Transportation Needs: Not on file  Physical Activity: Not on file  Stress: Not on file  Social Connections: Not on file     Family History: The patient's  family history includes Diabetes in his mother; Heart disease in his maternal grandfather and mother; Leukemia in his brother; Lung cancer in his father; Stroke in his mother and paternal grandfather.  ROS:   Please see the history of present illness.     EKGs/Labs/Other Studies Reviewed:    The following studies were reviewed today:  Echocardiogram 09/20/2013: Study Conclusions: - Left ventricle: The cavity size was normal. Wall thickness  was increased in a pattern of mild LVH. The estimated  ejection fraction was 55%. Although no diagnostic regional  wall motion abnormality was identified, this possibility  cannot be completely excluded on the basis of this study.  Septal bounce noted. Doppler parameters are consistent  with abnormal left ventricular relaxation (grade 1  diastolic dysfunction).  - Aortic valve: Trileaflet; moderately calcified leaflets.  There was no stenosis.  - Mitral valve: Moderately calcified annulus. Mildly  calcified leaflets . No significant regurgitation.  - Left atrium: The atrium was mildly dilated.  - Right ventricle: The cavity size was normal. Systolic  function was normal.  - Tricuspid valve: Peak RV-RA gradient: 51mm Hg (S).  - Pulmonary arteries: PA peak pressure: 10mm Hg (S).  - Inferior vena cava: The vessel was normal in size; the  respirophasic diameter changes were in the normal range (=  50%); findings are consistent with normal central venous  pressure.   Impressions:  - Normal LV size with mild LV hypertrophy. EF 55%. Septal  bounce noted. Normal RV size and systolic function. No  significant valvular abnormalities.  _______________  Leane Call 11/03/2016:  The left ventricular ejection fraction is normal (55-65%).  Nuclear stress EF: 55%.  There was no ST segment deviation noted during stress.  Defect 1: There is a medium defect of moderate severity present in the basal inferoseptal and mid  inferoseptal location.  This is a low risk study.  Low risk stress nuclear study with small prior basal/mid inferoseptal infarct; no ischemia; EF 55 with normal wall motion. _______________  Lower Extremity Ultrasounds 05/04/2018: Summary:  Right: Resting right ankle-brachial index indicates noncompressible right lower extremity arteries.The right toe-brachial  index is normal. Waveform analysis demonstrates no evidence of significant peripheral  arterial disease in the right lower extremity.   Left: Resting left ankle-brachial index indicates noncompressible left  lower extremity arteries.The left toe-brachial index is normal. Waveform analysis demonstrates no evidence of significant peripheral  arterial disease in the left lower extremity.   EKG:  EKG not ordered today.   Recent Labs: 09/16/2020: ALT 32; BUN 24; Creatinine, Ser 1.77; Hemoglobin 16.6; Platelets 216; Potassium 3.9; Sodium 138  Recent Lipid Panel    Component Value Date/Time   CHOL 101 (L) 11/25/2015 1343   TRIG 170 (H) 11/25/2015 1343   HDL 42 11/25/2015 1343   CHOLHDL 2.4 11/25/2015 1343   VLDL 34 (H) 11/25/2015 1343   LDLCALC 25 11/25/2015 1343    Physical Exam:    Vital Signs: BP 116/72   Pulse 86   Ht 5' 6.5" (1.689 m)   Wt 188 lb (85.3 kg)   BMI 29.89 kg/m     Wt Readings from Last 3 Encounters:  09/18/20 188 lb (85.3 kg)  09/16/20 183 lb (83 kg)  04/17/20 192 lb 9.6 oz (87.4 kg)     General: 87 y.o. male in no acute distress. HEENT: Normocephalic and atraumatic. Sclera clear. EOMs intact. Neck: Supple. No carotid bruits. No JVD. Heart: RRR. Distinct S1 and S2. No murmurs, gallops, or rubs. Radial pulses 2+ and equal bilaterally. Lungs: No increased work of breathing. Clear to ausculation bilaterally. No wheezes, rhonchi, or rales.  Abdomen: Soft, non-distended, and non-tender to palpation. MSK: Normal strength and tone for age.  Extremities: No lower extremity edema.    Skin: Warm and  dry. Neuro: Alert and oriented x3. No focal deficits. Psych: Normal affect. Responds appropriately.  Assessment:    1. Coronary artery disease involving native coronary artery of native heart without angina pectoris   2. S/P CABG (coronary artery bypass graft)   3. Wenckebach second degree AV block   4. Primary hypertension   5. Hyperlipidemia, unspecified hyperlipidemia type   6. Type 2 diabetes mellitus with complication, without long-term current use of insulin (HCC)   7. Obstructive sleep apnea   8. Stage 3 chronic kidney disease, unspecified whether stage 3a or 3b CKD ([place])     Plan:    CAD s/p CABG - History of CABG x4 in 2001. He has had 3 low risk nuclear stress test since that time most recently in 2018.  - Stable. No angina.  - Continue aspirin, beta-blocker, and statin.  2nd Degree AV Block Type 1 (Wenkebach) - History of 2nd degree AV block type. He notes he will have some heart rates in the 40's while sleeping (one episode in the high 30's). No episodes of bradycardia during the day. Asymptomatic with this. - Continue low dose Toprol-XL for now.   Hypertension - BP well controlled.  - Continue Olmesartan to 20mg  daily.  - Continue Toprol-XL 12.5mg  daily. - Continue Lasix 40mg  daily for lower extremity edema.   Hyperlipidemia - Most recent lipid pane from 03/2020 at PCP's office: Total Cholesterol 144, Triglycerides 368, HDL 35, LDL 35.   - Continue Lipitor 20mg  daily.  - Vascepa was increased to 2g twice daily at last visit due to elevated triglycerides.  - Patient states he had labs checked at the [place] last month and Triglycerides were in the 500's. Will ask for these labs to be sent to [place]. Wonder if we should increase Lipitor. Will wait for labs results and can then discuss with  Peter Mullen or PharmD.  Type 2 Diabetes Mellitus  - Hemoglobin A1c 7.0 in 03/2020. - Managed by PCP.   Obstructive Sleep Apnea - Sleep study in 04/2019 showed mild sleep apnea with  supine sleep. Given patient was sleeping adequately, Peter Mullen recommended avoiding supine sleep but did not pursue CPAP initiation at that time.  CKD Stage III - Creatinine 1.77 on 09/16/2020. Stable from prior check in 12/2019.  Disposition: Follow up in 6 months with Peter Mullen.   Medication Adjustments/Labs and Tests Ordered: Current medicines are reviewed at length with the patient today.  Concerns regarding medicines are outlined above.  No orders of the defined types were placed in this encounter.  No orders of the defined types were placed in this encounter.   Patient Instructions  Medication Instructions:  Continue current medications  *If you need a refill on your cardiac medications before your next appointment, please call your pharmacy*   Lab Work: None Ordered   Testing/Procedures: None Ordered   Follow-Up: At [facility], you and your health needs are our priority.  As part of our continuing mission to provide you with exceptional heart care, we have created designated Provider Care Teams.  These Care Teams include your primary Cardiologist (physician) and Advanced Practice Providers (APPs -  Physician Assistants and Nurse Practitioners) who all work together to provide you with the care you need, when you need it.  We recommend signing up for the patient portal called "MyChart".  Sign up information is provided on this After Visit Summary.  MyChart is used to connect with patients for Virtual Visits (Telemedicine).  Patients are able to view lab/test results, encounter notes, upcoming appointments, etc.  Non-urgent messages can be sent to your provider as well.   To learn more about what you can do with MyChart, go to [name].    Your next appointment:   6 month(s)  The format for your next appointment:   In Person  Provider:   You may see Peter Majestic, MD or one of the following Advanced Practice Providers on your designated Care Team:     Almyra Deforest, PA-C  Fabian Sharp, PA-C or   Roby Lofts, PA-C        Signed, Darreld Mclean, [place]  09/18/2020 1:23 PM    [place]

## 2020-09-16 ENCOUNTER — Emergency Department (HOSPITAL_COMMUNITY)
Admission: EM | Admit: 2020-09-16 | Discharge: 2020-09-17 | Disposition: A | Discharge status: Home / Self Care | Payer: Medicare Other | Attending: Emergency Medicine | Admitting: Emergency Medicine

## 2020-09-16 ENCOUNTER — Emergency Department (HOSPITAL_COMMUNITY): Payer: Medicare Other

## 2020-09-16 ENCOUNTER — Other Ambulatory Visit: Payer: Self-pay

## 2020-09-16 ENCOUNTER — Ambulatory Visit: Payer: Medicare Other | Admitting: Student

## 2020-09-16 ENCOUNTER — Encounter (HOSPITAL_COMMUNITY): Payer: Self-pay | Admitting: Emergency Medicine

## 2020-09-16 DIAGNOSIS — R40241 Glasgow coma scale score 13-15, unspecified time: Secondary | ICD-10-CM | POA: Diagnosis not present

## 2020-09-16 DIAGNOSIS — Z79899 Other long term (current) drug therapy: Secondary | ICD-10-CM | POA: Insufficient documentation

## 2020-09-16 DIAGNOSIS — Z951 Presence of aortocoronary bypass graft: Secondary | ICD-10-CM | POA: Diagnosis not present

## 2020-09-16 DIAGNOSIS — Z7984 Long term (current) use of oral hypoglycemic drugs: Secondary | ICD-10-CM | POA: Insufficient documentation

## 2020-09-16 DIAGNOSIS — Z7982 Long term (current) use of aspirin: Secondary | ICD-10-CM | POA: Insufficient documentation

## 2020-09-16 DIAGNOSIS — H53121 Transient visual loss, right eye: Secondary | ICD-10-CM | POA: Diagnosis not present

## 2020-09-16 DIAGNOSIS — E119 Type 2 diabetes mellitus without complications: Secondary | ICD-10-CM | POA: Insufficient documentation

## 2020-09-16 DIAGNOSIS — I2510 Atherosclerotic heart disease of native coronary artery without angina pectoris: Secondary | ICD-10-CM | POA: Diagnosis not present

## 2020-09-16 DIAGNOSIS — Z85828 Personal history of other malignant neoplasm of skin: Secondary | ICD-10-CM | POA: Diagnosis not present

## 2020-09-16 DIAGNOSIS — I10 Essential (primary) hypertension: Secondary | ICD-10-CM | POA: Insufficient documentation

## 2020-09-16 DIAGNOSIS — H539 Unspecified visual disturbance: Secondary | ICD-10-CM

## 2020-09-16 DIAGNOSIS — R42 Dizziness and giddiness: Secondary | ICD-10-CM | POA: Diagnosis present

## 2020-09-16 LAB — I-STAT CHEM 8, ED
BUN: 27 mg/dL — ABNORMAL HIGH (ref 8–23)
Calcium, Ion: 1.19 mmol/L (ref 1.15–1.40)
Chloride: 102 mmol/L (ref 98–111)
Creatinine, Ser: 1.8 mg/dL — ABNORMAL HIGH (ref 0.61–1.24)
Glucose, Bld: 159 mg/dL — ABNORMAL HIGH (ref 70–99)
HCT: 53 % — ABNORMAL HIGH (ref 39.0–52.0)
Hemoglobin: 18 g/dL — ABNORMAL HIGH (ref 13.0–17.0)
Potassium: 3.9 mmol/L (ref 3.5–5.1)
Sodium: 140 mmol/L (ref 135–145)
TCO2: 25 mmol/L (ref 22–32)

## 2020-09-16 LAB — COMPREHENSIVE METABOLIC PANEL
ALT: 32 U/L (ref 0–44)
AST: 35 U/L (ref 15–41)
Albumin: 4 g/dL (ref 3.5–5.0)
Alkaline Phosphatase: 69 U/L (ref 38–126)
Anion gap: 13 (ref 5–15)
BUN: 24 mg/dL — ABNORMAL HIGH (ref 8–23)
CO2: 24 mmol/L (ref 22–32)
Calcium: 9.7 mg/dL (ref 8.9–10.3)
Chloride: 101 mmol/L (ref 98–111)
Creatinine, Ser: 1.77 mg/dL — ABNORMAL HIGH (ref 0.61–1.24)
GFR, Estimated: 37 mL/min — ABNORMAL LOW (ref >60)
Glucose, Bld: 158 mg/dL — ABNORMAL HIGH (ref 70–99)
Potassium: 3.9 mmol/L (ref 3.5–5.1)
Sodium: 138 mmol/L (ref 135–145)
Total Bilirubin: 1 mg/dL (ref 0.3–1.2)
Total Protein: 7.1 g/dL (ref 6.5–8.1)

## 2020-09-16 LAB — CBC WITH DIFFERENTIAL/PLATELET
Abs Immature Granulocytes: 0.04 10*3/uL (ref 0.00–0.07)
Basophils Absolute: 0.1 10*3/uL (ref 0.0–0.1)
Basophils Relative: 1 %
Eosinophils Absolute: 0.2 10*3/uL (ref 0.0–0.5)
Eosinophils Relative: 2 %
HCT: 52.3 % — ABNORMAL HIGH (ref 39.0–52.0)
Hemoglobin: 16.6 g/dL (ref 13.0–17.0)
Immature Granulocytes: 0 %
Lymphocytes Relative: 32 %
Lymphs Abs: 3.6 10*3/uL (ref 0.7–4.0)
MCH: 29.7 pg (ref 26.0–34.0)
MCHC: 31.7 g/dL (ref 30.0–36.0)
MCV: 93.7 fL (ref 80.0–100.0)
Monocytes Absolute: 0.8 10*3/uL (ref 0.1–1.0)
Monocytes Relative: 7 %
Neutro Abs: 6.5 10*3/uL (ref 1.7–7.7)
Neutrophils Relative %: 58 %
Platelets: 216 10*3/uL (ref 150–400)
RBC: 5.58 MIL/uL (ref 4.22–5.81)
RDW: 16.8 % — ABNORMAL HIGH (ref 11.5–15.5)
WBC: 11.1 10*3/uL — ABNORMAL HIGH (ref 4.0–10.5)
nRBC: 0 % (ref 0.0–0.2)

## 2020-09-16 LAB — PROTIME-INR
INR: 1 (ref 0.8–1.2)
Prothrombin Time: 13.1 seconds (ref 11.4–15.2)

## 2020-09-16 LAB — CBG MONITORING, ED: Glucose-Capillary: 136 mg/dL — ABNORMAL HIGH (ref 70–99)

## 2020-09-16 LAB — APTT: aPTT: 37 seconds — ABNORMAL HIGH (ref 24–36)

## 2020-09-16 IMAGING — CT CT HEAD W/O CM
4 series · 17 of 47 positions shown, 19 images · non-contrast
Comparison: CT head [DATE]

CLINICAL DATA: Acute neuro deficit.  Dizziness and vision change

EXAM:
CT HEAD WITHOUT CONTRAST
TECHNIQUE: Contiguous axial images were obtained from the base of the skull
through the vertex without intravenous contrast.

[Series 3: head bone · axial · 0.48mm/px · z∈[-61,-3]mm · 4 of 84 slices shown]
[im 9/84  bone]
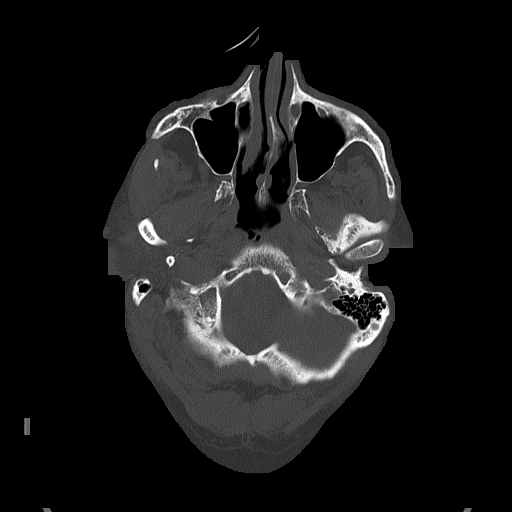
[im 17/84  bone]
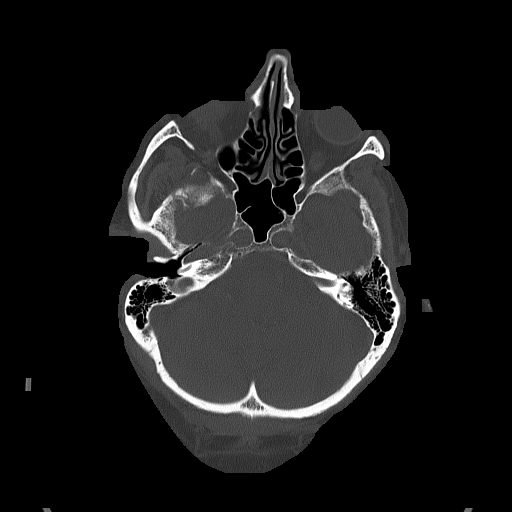
[im 25/84  bone]
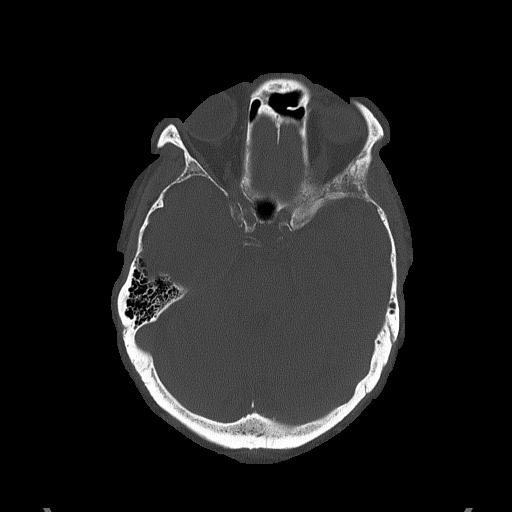
[im 38/84  bone]
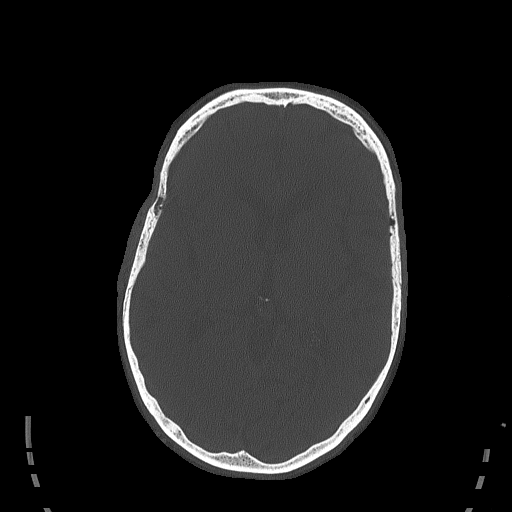

[Series 4: head without · axial · non-contrast · 0.48mm/px · z∈[-57,+63]mm · 7 of 34 slices shown, 9 images]
[im 5/34  brain]
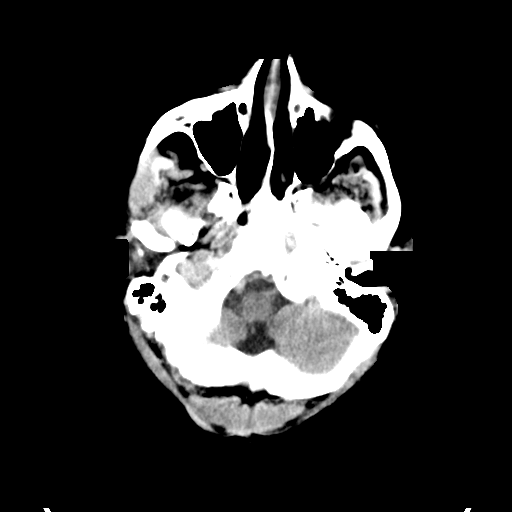
[im 5/34  bone]
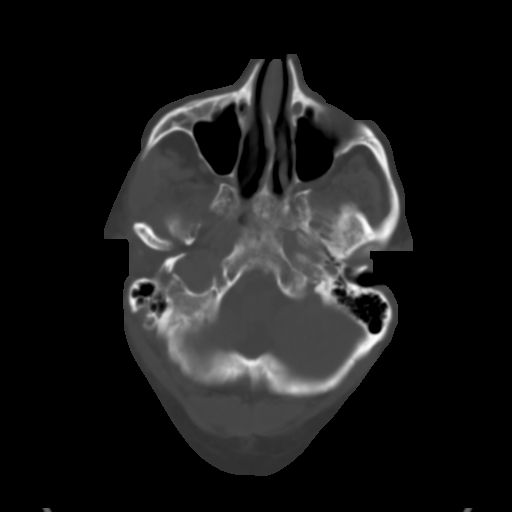
[im 9/34  brain]
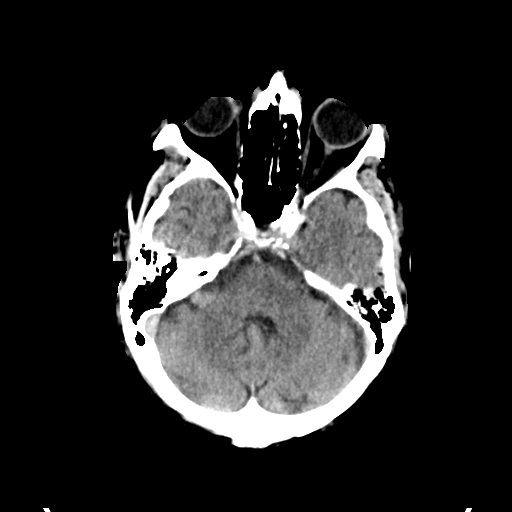
[im 13/34  brain]
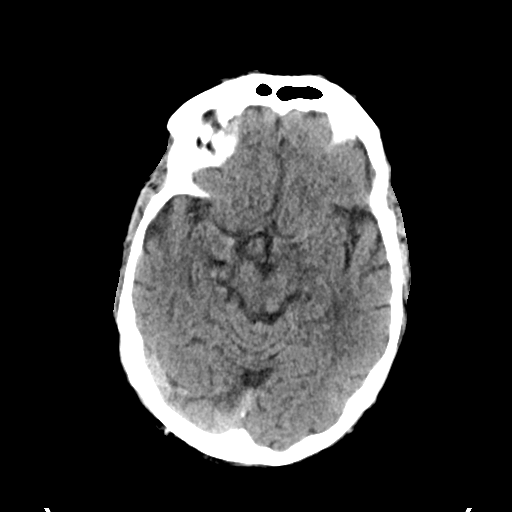
[im 17/34  brain]
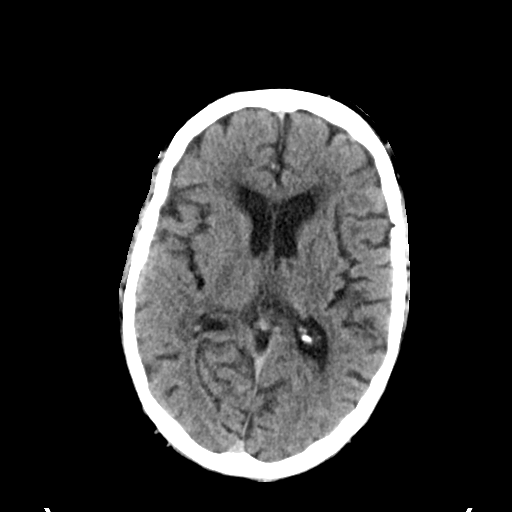
[im 21/34  brain]
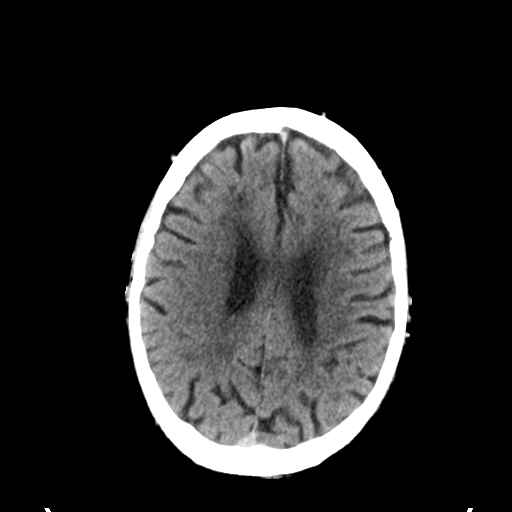
[im 21/34  bone]
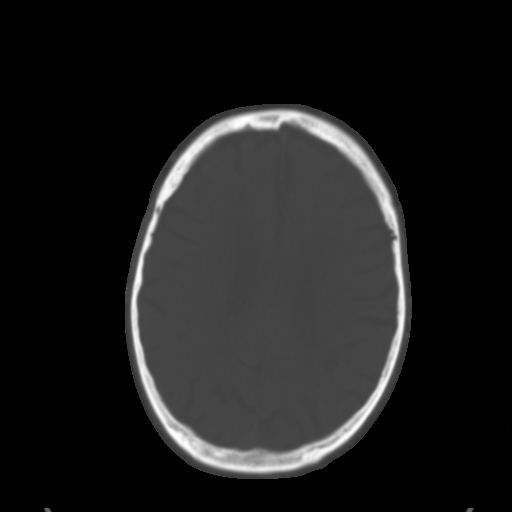
[im 25/34  brain]
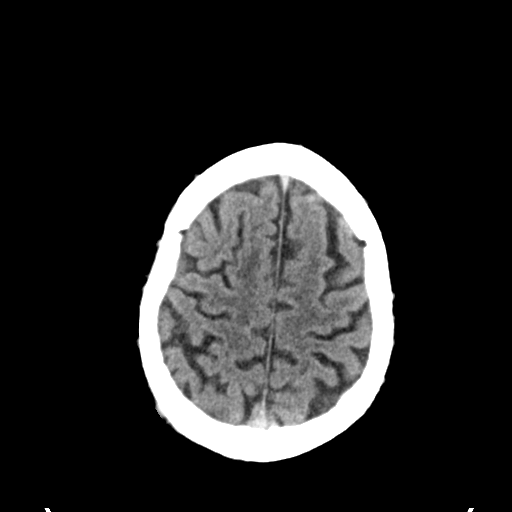
[im 29/34  brain]
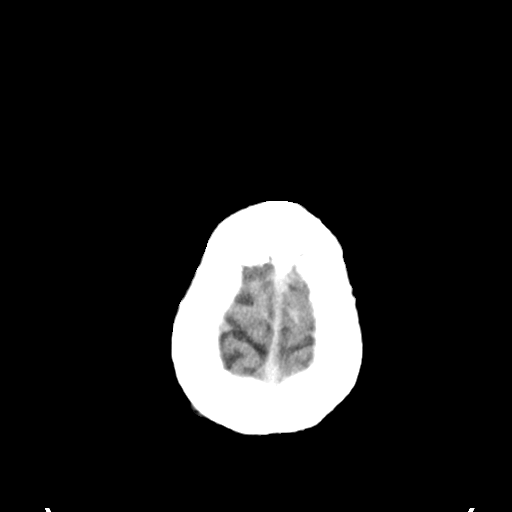

[Series 5: head without cor · coronal · non-contrast · 0.31mm/px · 3 of 74 slices shown]
[im 25/74  brain]
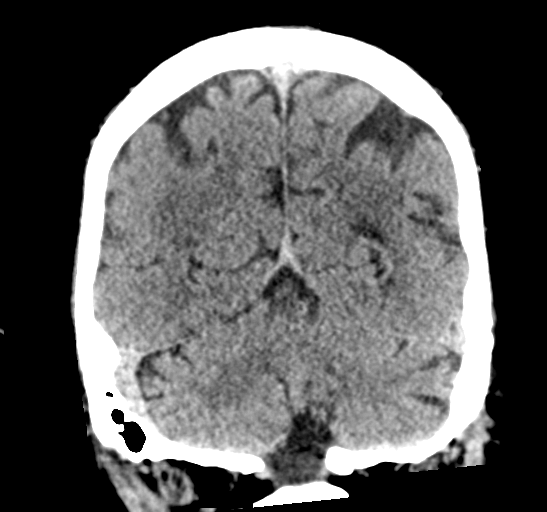
[im 33/74  brain]
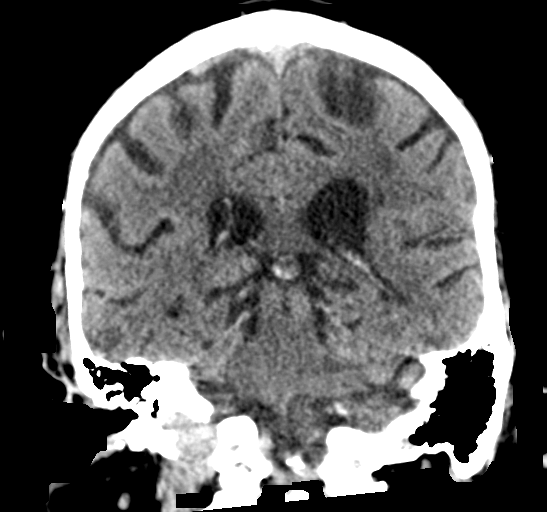
[im 41/74  brain]
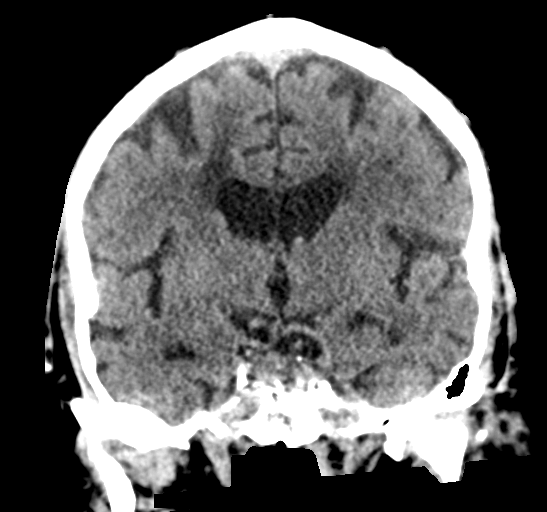

[Series 6: head without sag · sagittal · non-contrast · 0.34mm/px · 3 of 57 slices shown]
[im 19/57  brain]
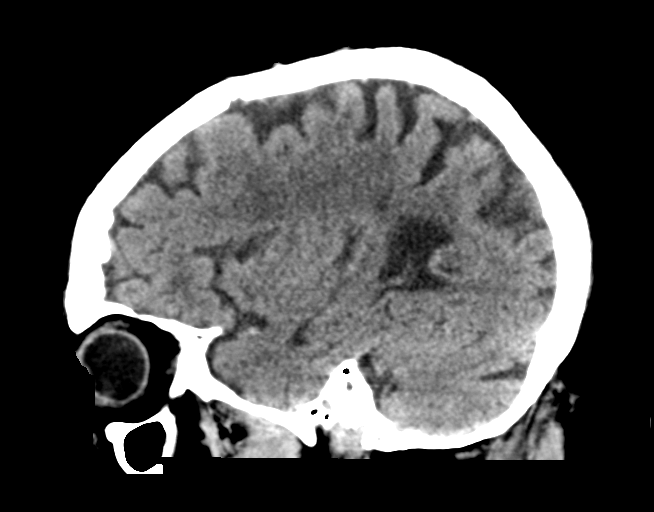
[im 29/57  brain]
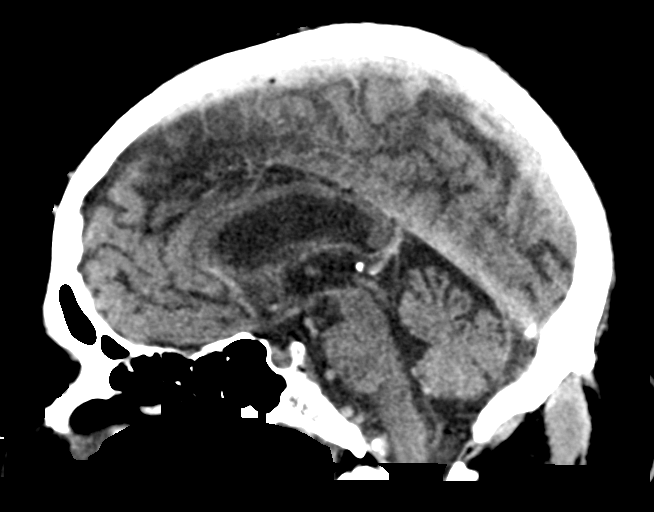
[im 38/57  brain]
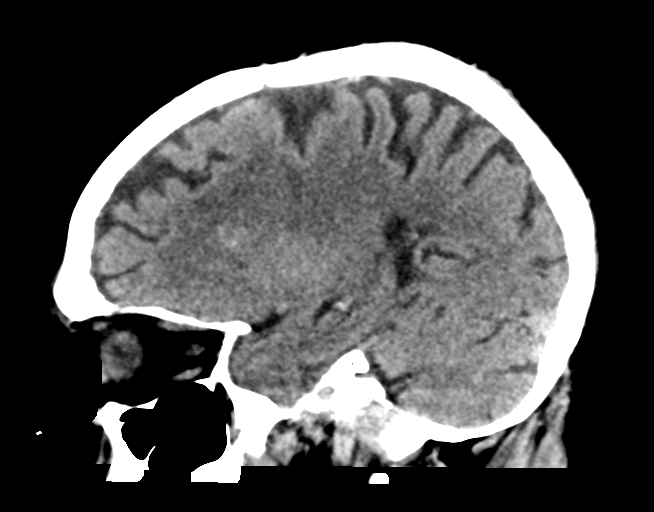

[17 of 47 positions shown; findings below may reference images not displayed]

FINDINGS: Brain: Progressive atrophy. Progressive white matter changes. Patchy
white matter hypodensity throughout the white matter bilaterally.

Negative for acute infarct, hemorrhage, mass

Vascular: Negative for hyperdense vessel. Atherosclerotic
calcification cavernous carotid bilaterally.

Skull: Negative

Sinuses/Orbits: Mild mucosal edema right maxillary sinus otherwise
clear sinuses. Bilateral cataract extraction

Other: None
IMPRESSION: No acute abnormality

Progressive atrophy and progressive white matter ischemia since

## 2020-09-16 MED ORDER — SODIUM CHLORIDE 0.9% FLUSH
3.0000 mL | Freq: Once | INTRAVENOUS | Status: DC
Start: 2020-09-16 — End: 2020-09-17

## 2020-09-16 NOTE — Care Plan (Signed)
Patient discussed with ED provider.  Reportedly had bilateral blurring only in the peripheral vision lasting a few hours today, onset 11 AM, now resolved since about 7 PM.  No other neurological deficits.  We discussed that bilateral peripheral double vision does not localize well to single vascular territory and would be a fairly atypical sign of ischemia.  However given patient has substantial stroke risk factors, will obtain MRI/MRA.  If this is negative patient can have outpatient follow-up with ophthalmology

## 2020-09-16 NOTE — ED Provider Notes (Signed)
[facility] EMERGENCY DEPARTMENT Provider Note   CSN: [id number] Arrival date & time: 09/16/20  1740     History Chief Complaint  Patient presents with  . Dizziness    Vision change     AMIIR Mullen is a 87 y.o. male.  87 y/o male with hx of DM, HTN, HLD, CAD s/p CABG x 4, self-reported hx of Afib (not anticoagulated) presents to the ED for evaluation; chief complaint of vision changes and lightheadedness.  Reports sudden onset of blurry vision to his periphery at 1145 this morning.  Had an associated sensation of lightheadedness/dizziness, but ultimately states that he "felt funny".  No room spinning.  His symptoms were not aggravated with position change or any other known factors.  Does report some spontaneous improvement beginning around 1900 tonight.  No associated fevers, syncope/near syncope, facial drooping, speech difficulty, extremity numbness or paresthesias, extremity weakness, chest pain, shortness of breath, nausea, vomiting.  Has a history of TIA in 2003.  Symptoms at that time were profound double vision lasting for 2 hours.  He takes a baby aspirin daily and has been compliant with this.  Was on Plavix until it was discontinued in 2019 following traumatic SDH.  Denies any recent changes to his medications.          Past Medical History:  Diagnosis Date  . Coronary artery disease   . Diabetes mellitus without complication ([place])   . Hyperlipidemia   . Hypertension   . Skin cancer   . Stomach problems     Patient Active Problem List   Diagnosis Date Noted  . Snoring 05/10/2019  . SDH (subdural hematoma) ([place]) 05/26/2018  . First degree heart block 02/05/2015  . AV block, [place] 1 11/27/2014  . Obesity (BMI 30.0-34.9) 05/29/2014  . Basal cell carcinoma of cheek 10/11/2013  . HTN (hypertension) 07/05/2013  . CAD in native artery 12/28/2012  . Hypertensive heart disease 12/28/2012  . Hyperlipidemia with target LDL less than 70 12/28/2012   . Heart palpitations 12/28/2012  . DM2 (diabetes mellitus, type 2) ([place]) 12/28/2012  . Mild sleep apnea 12/28/2012    Past Surgical History:  Procedure Laterality Date  . CORONARY ARTERY BYPASS GRAFT  2001   x4 LIMA-LAD, SVG-PDA  . [place]  . TONSILLECTOMY  1940  . TUMOR REMOVAL  1970   fatty tissue tumor  . VASECTOMY  1975       Family History  Problem Relation Age of Onset  . Diabetes Mother   . Heart disease Mother   . Stroke Mother   . Lung cancer Father   . Heart disease Maternal Grandfather   . Leukemia Brother   . Stroke Paternal Grandfather     Social History   Tobacco Use  . Smoking status: Never Smoker  . Smokeless tobacco: Never Used  Substance Use Topics  . Alcohol use: Yes    Alcohol/week: 2.0 standard drinks    Types: 2 Standard drinks or equivalent per week    Home Medications Prior to Admission medications   Medication Sig Start Date End Date Taking? Authorizing Provider  acetaminophen (TYLENOL) 650 MG CR tablet Take 1,300 mg by mouth at bedtime.   Yes [provider]  allopurinol (ZYLOPRIM) 300 MG tablet Take 300 mg by mouth daily. 02/10/18  Yes [provider]  aspirin 81 MG tablet Take 81 mg by mouth every evening.    Yes [provider]  atorvastatin (LIPITOR) 40 MG tablet  Take 0.5 tablets (20 mg total) by mouth daily at 6 PM. 08/02/19  Yes Troy Sine, MD  BIOTIN PO Take 1 tablet by mouth daily.   Yes [provider]  BLACK ELDERBERRY PO Take 50 mg by mouth 3 (three) times a week.   Yes [provider]  cetirizine (ZYRTEC) 10 MG tablet Take 10 mg by mouth at bedtime.   Yes [provider]  Cholecalciferol (VITAMIN D3) 50 MCG (2000 UT) capsule Take 2,000 Units by mouth 3 (three) times a week.   Yes [provider]  colchicine 0.6 MG tablet Take 0.6 mg by mouth daily as needed (gout flares).   Yes [provider]  Cyanocobalamin (VITAMIN B12 PO) Take 1,000  mg by mouth 3 (three) times a week.   Yes [provider]  diphenhydrAMINE (BENADRYL) 25 mg capsule Take 25 mg by mouth at bedtime as needed for sleep. 08/11/19  Yes [provider]  empagliflozin (JARDIANCE) 25 MG TABS tablet Take 12.5 mg by mouth daily. 02/05/20  Yes [provider]  fluticasone (FLONASE) 50 MCG/ACT nasal spray Place 1 spray into both nostrils every evening.   Yes [provider]  folic acid (FOLVITE) 811 MCG tablet Take 400 mcg by mouth 3 (three) times a week.   Yes [provider]  furosemide (LASIX) 40 MG tablet Take 1 tablet by mouth once daily Patient taking differently: Take 40 mg by mouth daily. 07/01/20  Yes Troy Sine, MD  guaiFENesin (MUCINEX) 600 MG 12 hr tablet Take 600 mg by mouth daily.   Yes [provider]  icosapent Ethyl (VASCEPA) 1 g capsule Take 2 capsules (2 g total) by mouth 2 (two) times daily. 04/17/20  Yes Troy Sine, MD  Melatonin 10 MG TABS Take 10 mg by mouth at bedtime.   Yes [provider]  metFORMIN (GLUCOPHAGE) 500 MG tablet Take 500 mg by mouth 2 (two) times daily with a meal.    Yes [provider]  metoprolol succinate (TOPROL-XL) 25 MG 24 hr tablet Take 1/2 (one-half) tablet by mouth once daily Patient taking differently: Take 12.5 mg by mouth daily. 07/01/20  Yes Troy Sine, MD  montelukast (SINGULAIR) 10 MG tablet Take 10 mg by mouth at bedtime.  05/04/14  Yes [provider]  Multiple Vitamins-Minerals (ZINC PO) Take 1 tablet by mouth 3 (three) times a week.   Yes [provider]  nitroGLYCERIN (NITROSTAT) 0.4 MG SL tablet Place 1 tablet under the tongue daily as needed for chest pain.   Yes [provider]  olmesartan (BENICAR) 20 MG tablet Take 1 tablet (20 mg total) by mouth daily. Patient taking differently: Take 20 mg by mouth every evening. 12/28/19  Yes Sande Rives E, PA-C  omeprazole (PRILOSEC) 20 MG capsule Take 20 mg by  mouth 2 (two) times daily before a meal.   Yes [provider]  oxymetazoline (AFRIN) 0.05 % nasal spray Place 1 spray into both nostrils at bedtime.   Yes [provider]  testosterone (ANDROGEL) 50 MG/5GM GEL Place 5 g onto the skin 2 (two) times daily.    Yes [provider]  vitamin E 400 UNIT capsule Take 400 Units by mouth 3 (three) times a week.   Yes [provider]  doxycycline (VIBRAMYCIN) 100 MG capsule Take 100 mg by mouth See admin instructions. Bid x 5 days Patient not taking: Reported on 09/16/2020 08/29/20   [provider]  Allergies    Codeine and Nsaids  Review of Systems   Review of Systems  Ten systems reviewed and are negative for acute change, except as noted in the HPI.    Physical Exam Updated Vital Signs BP (!) 147/64   Pulse 80   Temp 98.1 F (36.7 C) (Oral)   Resp 19   Ht 5' 6.5" (1.689 m)   Wt 83 kg   SpO2 95%   BMI 29.09 kg/m   Physical Exam Vitals and nursing note reviewed.  Constitutional:      General: He is not in acute distress.    Appearance: He is well-developed and well-nourished. He is not diaphoretic.     Comments: Nontoxic appearing and in NAD  HENT:     Head: Normocephalic and atraumatic.     Right Ear: External ear normal.     Left Ear: External ear normal.     Mouth/Throat:     Mouth: Mucous membranes are moist.     Comments: Symmetric rise of the uvula with phonation Eyes:     General: No scleral icterus.    Extraocular Movements: EOM normal.     Conjunctiva/sclera: Conjunctivae normal.     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 R pupil sluggish. L pupil with normal direct and consensual reaction. No nystagmus. Full EOMs. No visual field deficits.  Cardiovascular:     Rate and Rhythm: Normal rate. Rhythm irregular.     Pulses: Normal pulses.  Pulmonary:     Effort: Pulmonary effort is normal. No respiratory distress.     Breath sounds: No stridor. No wheezing,  rhonchi or rales.     Comments: Lungs CTAB. Respirations even and unlabored. Musculoskeletal:        General: Normal range of motion.     Cervical back: Normal range of motion.  Skin:    General: Skin is warm and dry.     Coloration: Skin is not pale.     Findings: No erythema or rash.  Neurological:     Mental Status: He is alert and oriented to person, place, and time.     Coordination: Coordination normal.     Comments: GCS 15. Speech is goal oriented. No cranial nerve deficits appreciated; symmetric eyebrow raise, no facial drooping, tongue midline. Patient has equal grip strength bilaterally with 5/5 strength against resistance in all major muscle groups bilaterally. Sensation to light touch intact. Patient moves extremities without ataxia.  Psychiatric:        Mood and Affect: Mood and affect normal.        Behavior: Behavior normal.     ED Results / Procedures / Treatments   Labs (all labs ordered are listed, but only abnormal results are displayed) Labs Reviewed  APTT - Abnormal; Notable for the following components:      Result Value   aPTT 37 (*)    All other components within normal limits  CBC WITH DIFFERENTIAL/PLATELET - Abnormal; Notable for the following components:   WBC 11.1 (*)    HCT 52.3 (*)    RDW 16.8 (*)    All other components within normal limits  COMPREHENSIVE METABOLIC PANEL - Abnormal; Notable for the following components:   Glucose, Bld 158 (*)    BUN 24 (*)    Creatinine, Ser 1.77 (*)    GFR, Estimated 37 (*)    All other components within normal limits  I-STAT CHEM 8, ED - Abnormal; Notable for the following components:   BUN  27 (*)    Creatinine, Ser 1.80 (*)    Glucose, Bld 159 (*)    Hemoglobin 18.0 (*)    HCT 53.0 (*)    All other components within normal limits  CBG MONITORING, ED - Abnormal; Notable for the following components:   Glucose-Capillary 136 (*)    All other components within normal limits  PROTIME-INR    EKG EKG  Interpretation  Date/Time:  Monday September 16 2020 17:49:58 EST Ventricular Rate:  71 PR Interval:  158 QRS Duration: 126 QT Interval:  398 QTC Calculation: 432 R Axis:   -65 Text Interpretation: Sinus rhythm with marked sinus arrhythmia Left bundle branch block Confirmed by Randal Buba, April ([id number]) on 09/17/2020 1:35:57 AM   Radiology CT HEAD WO CONTRAST  Result Date: 09/16/2020 CLINICAL DATA:  Acute neuro deficit.  Dizziness and vision change EXAM: CT HEAD WITHOUT CONTRAST TECHNIQUE: Contiguous axial images were obtained from the base of the skull through the vertex without intravenous contrast. COMPARISON:  CT head 07/02/2016 FINDINGS: Brain: Progressive atrophy. Progressive white matter changes. Patchy white matter hypodensity throughout the white matter bilaterally. Negative for acute infarct, hemorrhage, mass Vascular: Negative for hyperdense vessel. Atherosclerotic calcification cavernous carotid bilaterally. Skull: Negative Sinuses/Orbits: Mild mucosal edema right maxillary sinus otherwise clear sinuses. Bilateral cataract extraction Other: None IMPRESSION: No acute abnormality Progressive atrophy and progressive white matter ischemia since 2007. Electronically Signed   By: Franchot Gallo M.D.   On: 09/16/2020 19:08   MR ANGIO HEAD WO CONTRAST  Result Date: 09/17/2020 CLINICAL DATA:  Vision loss. EXAM: MRI HEAD WITHOUT CONTRAST MRA HEAD WITHOUT CONTRAST TECHNIQUE: Multiplanar, multiecho pulse sequences of the brain and surrounding structures were obtained without intravenous contrast. Angiographic images of the head were obtained using MRA technique without contrast. COMPARISON:  None. FINDINGS: MRI HEAD FINDINGS Brain: No acute infarct, mass effect or extra-axial collection. No acute or chronic hemorrhage. There is multifocal hyperintense T2-weighted signal within the white matter. Generalized volume loss without a clear lobar predilection. The midline structures are normal. Vascular: Major  flow voids are preserved. Skull and upper cervical spine: Normal calvarium and skull base. Visualized upper cervical spine and soft tissues are normal. Sinuses/Orbits:No paranasal sinus fluid levels or advanced mucosal thickening. No mastoid or middle ear effusion. Normal orbits. MRA HEAD FINDINGS POSTERIOR CIRCULATION: --Vertebral arteries: Normal --Inferior cerebellar arteries: Normal. --Basilar artery: Normal. --Superior cerebellar arteries: Normal. --Posterior cerebral arteries: Normal. ANTERIOR CIRCULATION: --Intracranial internal carotid arteries: Normal. --Anterior cerebral arteries (ACA): Normal. --Middle cerebral arteries (MCA): Normal. ANATOMIC VARIANTS: Bilateral fetal origins of the PCAs IMPRESSION: 1. No acute intracranial abnormality. 2. Normal intracranial MRA. 3. Moderate chronic small vessel disease and volume loss. Electronically Signed   By: Ulyses Jarred M.D.   On: 09/17/2020 01:52   MR BRAIN WO CONTRAST  Result Date: 09/17/2020 CLINICAL DATA:  Vision loss. EXAM: MRI HEAD WITHOUT CONTRAST MRA HEAD WITHOUT CONTRAST TECHNIQUE: Multiplanar, multiecho pulse sequences of the brain and surrounding structures were obtained without intravenous contrast. Angiographic images of the head were obtained using MRA technique without contrast. COMPARISON:  None. FINDINGS: MRI HEAD FINDINGS Brain: No acute infarct, mass effect or extra-axial collection. No acute or chronic hemorrhage. There is multifocal hyperintense T2-weighted signal within the white matter. Generalized volume loss without a clear lobar predilection. The midline structures are normal. Vascular: Major flow voids are preserved. Skull and upper cervical spine: Normal calvarium and skull base. Visualized upper cervical spine and soft tissues are normal. Sinuses/Orbits:No paranasal sinus fluid levels or advanced  mucosal thickening. No mastoid or middle ear effusion. Normal orbits. MRA HEAD FINDINGS POSTERIOR CIRCULATION: --Vertebral arteries:  Normal --Inferior cerebellar arteries: Normal. --Basilar artery: Normal. --Superior cerebellar arteries: Normal. --Posterior cerebral arteries: Normal. ANTERIOR CIRCULATION: --Intracranial internal carotid arteries: Normal. --Anterior cerebral arteries (ACA): Normal. --Middle cerebral arteries (MCA): Normal. ANATOMIC VARIANTS: Bilateral fetal origins of the PCAs IMPRESSION: 1. No acute intracranial abnormality. 2. Normal intracranial MRA. 3. Moderate chronic small vessel disease and volume loss. Electronically Signed   By: Ulyses Jarred M.D.   On: 09/17/2020 01:52    Procedures Procedures   Medications Ordered in ED Medications  sodium chloride flush (NS) 0.9 % injection 3 mL (3 mLs Intravenous Not Given 09/16/20 2207)    ED Course  I have reviewed the triage vital signs and the nursing notes.  Pertinent labs & imaging results that were available during my care of the patient were reviewed by me and considered in my medical decision making (see chart for details).  Clinical Course as of 09/17/20 0214  Mon Sep 16, 2020  2244 ABCD2 score is 5 NIH at this time is 0 [KH]  2250 EKG with LBBB and sinus arrhythmia, but no Afib. Appears unchanged from prior on 04/18/20. [FY]  1017 Spoke with Dr. Curly [place] of Neurology. Recommend MRI brain and close outpatient f/u if negative. [KH]  Tue Sep 17, 2020  0213 MRI results negative for acute intracranial abnormality.  Results discussed with patient who verbalizes understanding.  Expresses comfort with discharge and outpatient follow-up. [KH]    Clinical Course User Index [KH] Beverely Pace   MDM Rules/Calculators/A&P                          87 year old male presents to the emergency department for evaluation of peripheral field vision changes with associated lightheadedness/dizziness.  Symptoms persisted for approximately 7 hours before spontaneously resolving.  Asymptomatic at the time of ED assessment.  Has no focal neurologic deficits on exam.   Stroke work-up initiated upon arrival.  Head CT negative for acute intracranial abnormality.  Labs at baseline with stable CKD.  Case discussed with neurology who recommend MRI.  If negative, patient appropriate for close outpatient follow-up.  MRI completed and without signs of acute CVA.  The patient without clinical decompensation or additional complaints on repeat assessment.  He is comfortable following up with his primary care doctor and ophthalmologist.  Continue ASA.  Return precautions discussed and provided. Patient discharged in stable condition with no unaddressed concerns.   Final Clinical Impression(s) / ED Diagnoses Final diagnoses:  Transient vision disturbance    Rx / DC Orders ED Discharge Orders    None       Antonietta Breach, PA-C 09/17/20 0240    Palumbo, April, MD 09/17/20 [phone]

## 2020-09-16 NOTE — ED Provider Notes (Incomplete)
[facility] EMERGENCY DEPARTMENT Provider Note   CSN: [id number] Arrival date & time: 09/16/20  1740     History Chief Complaint  Patient presents with  . Dizziness    Vision change     Peter Mullen is a 87 y.o. male.  87 y/o male with hx of DM, HTN, HLD, CAD s/p CABG x 4, self-reported hx of Afib (not anticoagulated) presents to the ED for evaluation; chief complaint of vision changes and lightheadedness.  Reports sudden onset of blurry vision to his periphery at 1145 this morning.  Had an associated sensation of lightheadedness/dizziness, but ultimately states that he "felt funny".  No room spinning.  His symptoms were not aggravated with position change or any other known factors.  Does report some spontaneous improvement beginning around 1900 tonight.  No associated fevers, syncope/near syncope, facial drooping, speech difficulty, extremity numbness or paresthesias, extremity weakness, chest pain, shortness of breath, nausea, vomiting.  Has a history of TIA in 2003.  Symptoms at that time were profound double vision lasting for 2 hours.  He takes a baby aspirin daily and has been compliant with this.  Was on Plavix until it was discontinued in 2019 following traumatic SDH.  Denies any recent changes to his medications.          Past Medical History:  Diagnosis Date  . Coronary artery disease   . Diabetes mellitus without complication ([place])   . Hyperlipidemia   . Hypertension   . Skin cancer   . Stomach problems     Patient Active Problem List   Diagnosis Date Noted  . Snoring 05/10/2019  . SDH (subdural hematoma) ([place]) 05/26/2018  . First degree heart block 02/05/2015  . AV block, [place] 1 11/27/2014  . Obesity (BMI 30.0-34.9) 05/29/2014  . Basal cell carcinoma of cheek 10/11/2013  . HTN (hypertension) 07/05/2013  . CAD in native artery 12/28/2012  . Hypertensive heart disease 12/28/2012  . Hyperlipidemia with target LDL less than 70 12/28/2012   . Heart palpitations 12/28/2012  . DM2 (diabetes mellitus, type 2) ([place]) 12/28/2012  . Mild sleep apnea 12/28/2012    Past Surgical History:  Procedure Laterality Date  . CORONARY ARTERY BYPASS GRAFT  2001   x4 LIMA-LAD, SVG-PDA  . [place]  . TONSILLECTOMY  1940  . TUMOR REMOVAL  1970   fatty tissue tumor  . VASECTOMY  1975       Family History  Problem Relation Age of Onset  . Diabetes Mother   . Heart disease Mother   . Stroke Mother   . Lung cancer Father   . Heart disease Maternal Grandfather   . Leukemia Brother   . Stroke Paternal Grandfather     Social History   Tobacco Use  . Smoking status: Never Smoker  . Smokeless tobacco: Never Used  Substance Use Topics  . Alcohol use: Yes    Alcohol/week: 2.0 standard drinks    Types: 2 Standard drinks or equivalent per week    Home Medications Prior to Admission medications   Medication Sig Start Date End Date Taking? Authorizing Provider  allopurinol (ZYLOPRIM) 300 MG tablet Take 300 mg by mouth daily. 02/10/18   [provider]  aspirin 81 MG tablet Take 81 mg by mouth every evening.     [provider]  atorvastatin (LIPITOR) 40 MG tablet Take 0.5 tablets (20 mg total) by mouth daily at 6 PM. 08/02/19   Troy Sine, MD  BLACK ELDERBERRY PO Take 50 mg by mouth daily.    [provider]  cetirizine (ZYRTEC) 10 MG tablet Take 10 mg by mouth daily.    [provider]  Cholecalciferol (VITAMIN D3) 50 MCG (2000 UT) capsule Take 2,000 Units by mouth daily.    [provider]  colchicine 0.6 MG tablet Take 0.6 mg by mouth every Monday, Wednesday, and Friday.    [provider]  Cyanocobalamin (VITAMIN B12 PO) Take by mouth.    [provider]  Empagliflozin-metFORMIN HCl 12.11-998 MG TABS Take 12.5 mg by mouth.    [provider]  folic acid (FOLVITE) 570 MCG tablet Take 400 mcg by mouth daily.    [provider]   furosemide (LASIX) 40 MG tablet Take 1 tablet by mouth once daily 07/01/20   Troy Sine, MD  glucosamine-chondroitin 500-400 MG tablet Take 1 tablet by mouth 2 (two) times daily.    [provider]  guaiFENesin (MUCINEX) 600 MG 12 hr tablet Take 600 mg by mouth 2 (two) times daily.    [provider]  icosapent Ethyl (VASCEPA) 1 g capsule Take 2 capsules (2 g total) by mouth 2 (two) times daily. 04/17/20   Troy Sine, MD  metFORMIN (GLUCOPHAGE) 500 MG tablet Take 500 mg by mouth 2 (two) times daily with a meal.     [provider]  metoprolol succinate (TOPROL-XL) 25 MG 24 hr tablet Take 1/2 (one-half) tablet by mouth once daily 07/01/20   Troy Sine, MD  montelukast (SINGULAIR) 10 MG tablet Take 10 mg by mouth at bedtime.  05/04/14   [provider]  nitroGLYCERIN (NITROSTAT) 0.4 MG SL tablet Place 1 tablet under the tongue daily as needed for chest pain.    [provider]  olmesartan (BENICAR) 20 MG tablet Take 1 tablet (20 mg total) by mouth daily. 12/28/19   Darreld Mclean, PA-C  omeprazole (PRILOSEC) 20 MG capsule Take 20 mg by mouth 2 (two) times daily before a meal.    [provider]  testosterone (ANDROGEL) 50 MG/5GM GEL Place 5 g onto the skin 2 (two) times daily.     [provider]  vitamin E 400 UNIT capsule Take 400 Units by mouth daily.    [provider]    Allergies    Codeine and Nsaids  Review of Systems   Review of Systems  Ten systems reviewed and are negative for acute change, except as noted in the HPI.    Physical Exam Updated Vital Signs BP (!) 150/129   Pulse 78   Temp 98.2 F (36.8 C) (Oral)   Resp 20   Ht 5' 6.5" (1.689 m)   Wt 83 kg   SpO2 95%   BMI 29.09 kg/m   Physical Exam Vitals and nursing note reviewed.  Constitutional:      General: He is not in acute distress.    Appearance: He is well-developed and well-nourished. He is not diaphoretic.     Comments:  Nontoxic appearing and in NAD  HENT:     Head: Normocephalic and atraumatic.     Right Ear: External ear normal.     Left Ear: External ear normal.     Mouth/Throat:     Mouth: Mucous membranes are moist.     Comments: Symmetric rise of the uvula with phonation Eyes:     General: No scleral icterus.    Extraocular Movements: EOM normal.  Conjunctiva/sclera: Conjunctivae normal.     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 R pupil sluggish. L pupil with normal direct and consensual reaction. No nystagmus. Full EOMs. No visual field deficits.  Cardiovascular:     Rate and Rhythm: Normal rate. Rhythm irregular.     Pulses: Normal pulses.  Pulmonary:     Effort: Pulmonary effort is normal. No respiratory distress.     Breath sounds: No stridor. No wheezing, rhonchi or rales.     Comments: Lungs CTAB. Respirations even and unlabored. Musculoskeletal:        General: Normal range of motion.     Cervical back: Normal range of motion.  Skin:    General: Skin is warm and dry.     Coloration: Skin is not pale.     Findings: No erythema or rash.  Neurological:     Mental Status: He is alert and oriented to person, place, and time.     Coordination: Coordination normal.     Comments: GCS 15. Speech is goal oriented. No cranial nerve deficits appreciated; symmetric eyebrow raise, no facial drooping, tongue midline. Patient has equal grip strength bilaterally with 5/5 strength against resistance in all major muscle groups bilaterally. Sensation to light touch intact. Patient moves extremities without ataxia.  Psychiatric:        Mood and Affect: Mood and affect normal.        Behavior: Behavior normal.     ED Results / Procedures / Treatments   Labs (all labs ordered are listed, but only abnormal results are displayed) Labs Reviewed  APTT - Abnormal; Notable for the following components:      Result Value   aPTT 37 (*)    All other components within normal limits  CBC  WITH DIFFERENTIAL/PLATELET - Abnormal; Notable for the following components:   WBC 11.1 (*)    HCT 52.3 (*)    RDW 16.8 (*)    All other components within normal limits  COMPREHENSIVE METABOLIC PANEL - Abnormal; Notable for the following components:   Glucose, Bld 158 (*)    BUN 24 (*)    Creatinine, Ser 1.77 (*)    GFR, Estimated 37 (*)    All other components within normal limits  I-STAT CHEM 8, ED - Abnormal; Notable for the following components:   BUN 27 (*)    Creatinine, Ser 1.80 (*)    Glucose, Bld 159 (*)    Hemoglobin 18.0 (*)    HCT 53.0 (*)    All other components within normal limits  CBG MONITORING, ED - Abnormal; Notable for the following components:   Glucose-Capillary 136 (*)    All other components within normal limits  PROTIME-INR    EKG None  Radiology CT HEAD WO CONTRAST  Result Date: 09/16/2020 CLINICAL DATA:  Acute neuro deficit.  Dizziness and vision change EXAM: CT HEAD WITHOUT CONTRAST TECHNIQUE: Contiguous axial images were obtained from the base of the skull through the vertex without intravenous contrast. COMPARISON:  CT head 07/02/2016 FINDINGS: Brain: Progressive atrophy. Progressive white matter changes. Patchy white matter hypodensity throughout the white matter bilaterally. Negative for acute infarct, hemorrhage, mass Vascular: Negative for hyperdense vessel. Atherosclerotic calcification cavernous carotid bilaterally. Skull: Negative Sinuses/Orbits: Mild mucosal edema right maxillary sinus otherwise clear sinuses. Bilateral cataract extraction Other: None IMPRESSION: No acute abnormality Progressive atrophy and progressive white matter ischemia since 2007. Electronically Signed   By: Franchot Gallo M.D.   On: 09/16/2020 19:08    Procedures Procedures {  Remember to document critical care time when appropriate:1}  Medications Ordered in ED Medications  sodium chloride flush (NS) 0.9 % injection 3 mL (3 mLs Intravenous Not Given 09/16/20 2207)     ED Course  I have reviewed the triage vital signs and the nursing notes.  Pertinent labs & imaging results that were available during my care of the patient were reviewed by me and considered in my medical decision making (see chart for details).  Clinical Course as of 09/16/20 2256  Mon Sep 16, 2020  2244 ABCD2 score is 5 NIH at this time is 0 [KH]  2250 EKG with LBBB and sinus arrhythmia, but no Afib. Appears unchanged from prior on 04/18/20. [KH]    Clinical Course User Index [KH] Antonietta Breach, PA-C   MDM Rules/Calculators/A&P                          *** Final Clinical Impression(s) / ED Diagnoses Final diagnoses:  None    Rx / DC Orders ED Discharge Orders    None

## 2020-09-16 NOTE — ED Triage Notes (Incomplete)
Pt states after working out around 1145 am today he had a dizzy spell and felt a little light headed this has been coming and going, he also reports peripheral  vision loss. Hx of TIA.

## 2020-09-16 NOTE — ED Triage Notes (Addendum)
Pt reports at around 1145 he started to have blurry vision in his peripheral vision. Pt reports when he turned his head he was lightheaded. Reports he went to his dermatologist and went back home. Pt reports s/s continued and at 1900 s/s "seemed to improve". Pt still reports some lightheadedness at this time and reports his peripheral vision is back to norma. Pt reports hx of TIA in 2003. Pt was on plavix until 2019. Pt denies taking any blood thinners at this time. Pt reports he takes 81mg  ASA daily.

## 2020-09-17 ENCOUNTER — Emergency Department (HOSPITAL_COMMUNITY): Payer: Medicare Other

## 2020-09-17 IMAGING — MR MR MRA HEAD W/O CM
4 of 5 series · 29 of 48 positions shown · non-contrast
Comparison: None.

CLINICAL DATA: Vision loss.

EXAM:
MRI HEAD WITHOUT CONTRAST
MRA HEAD WITHOUT CONTRAST
TECHNIQUE: Multiplanar, multiecho pulse sequences of the brain and surrounding
structures were obtained without intravenous contrast. Angiographic
images of the head were obtained using MRA technique without
contrast.

[Series 5: DWI · axial · 3.0mm · 0.88mm/px · z∈[-97,+55]mm · 11 of 104 slices shown (1 of 4)]
[im 1/104]
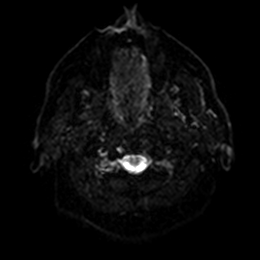
[im 11/104]
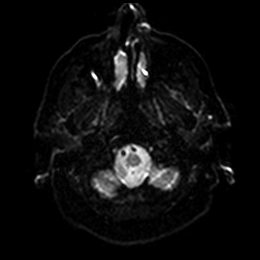
[im 21/104]
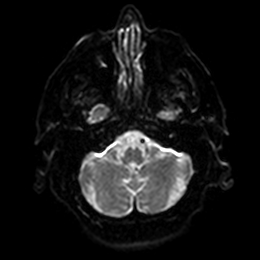
[im 31/104]
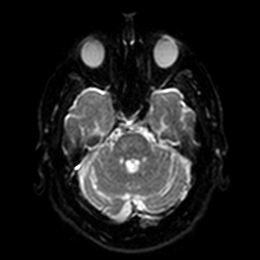
[im 42/104]
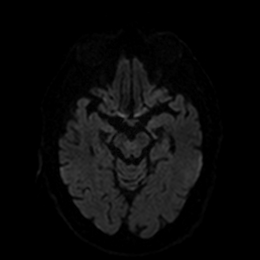
[im 52/104]
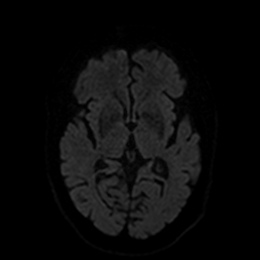
[im 62/104]
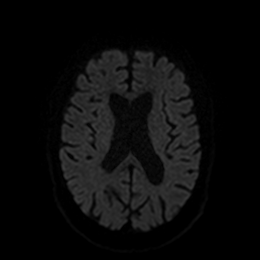
[im 73/104]
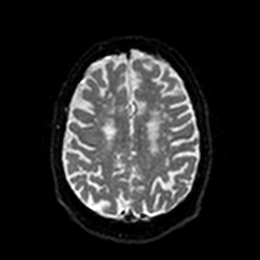
[im 83/104]
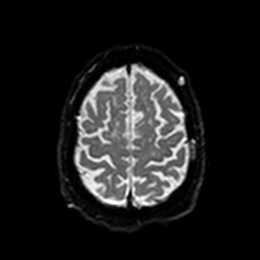
[im 93/104]
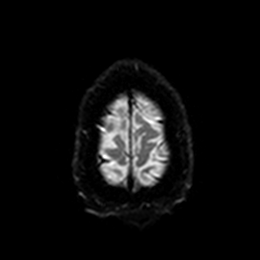
[im 104/104]
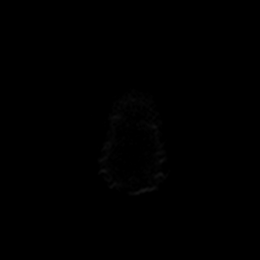

[Series 6: DWI · axial · 3.0mm · 0.88mm/px · z∈[-97,+55]mm · 6 of 52 slices shown (2 of 4)]
[im 1/52]
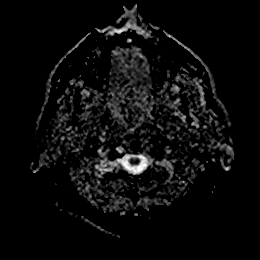
[im 11/52]
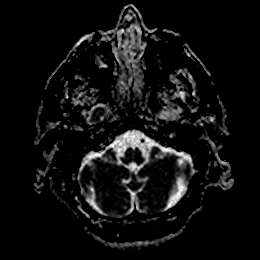
[im 21/52]
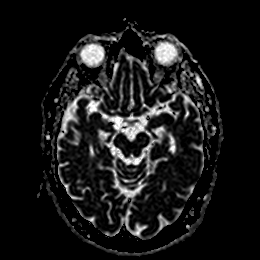
[im 31/52]
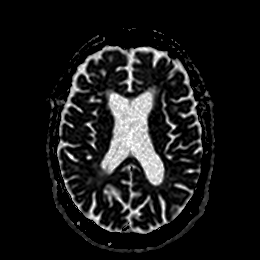
[im 41/52]
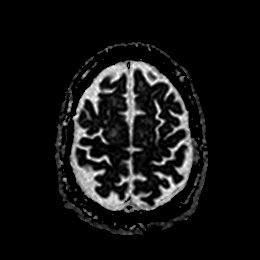
[im 52/52]
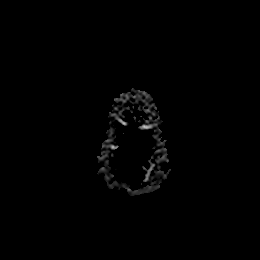

[Series 7: DWI · coronal · 4.0mm · 0.88mm/px · 8 of 68 slices shown (3 of 4)]
[im 1/68]
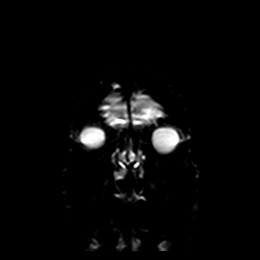
[im 10/68]
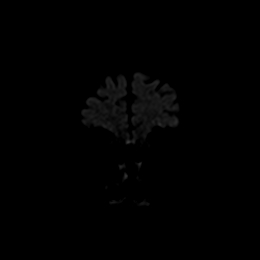
[im 20/68]
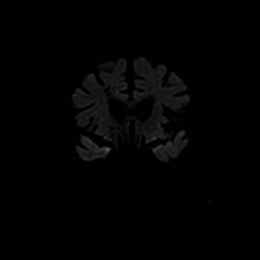
[im 29/68]
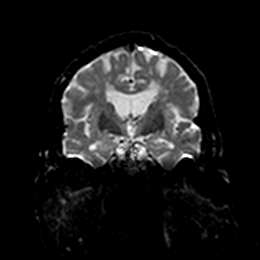
[im 39/68]
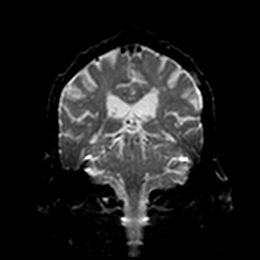
[im 48/68]
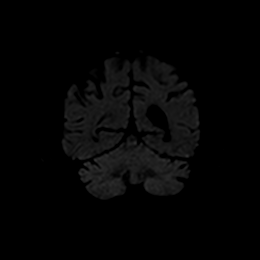
[im 58/68]
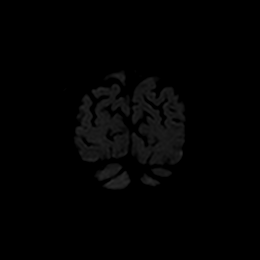
[im 68/68]
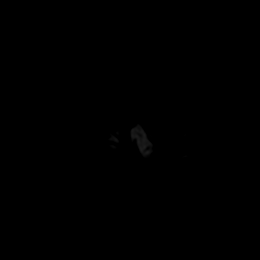

[Series 8: DWI · coronal · 4.0mm · 0.88mm/px · 4 of 34 slices shown (4 of 4)]
[im 1/34]
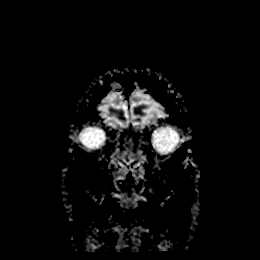
[im 12/34]
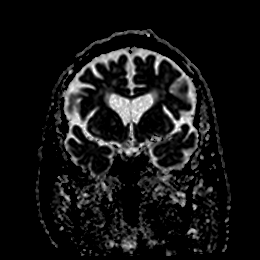
[im 23/34]
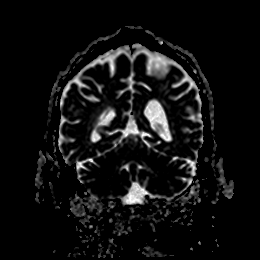
[im 34/34]
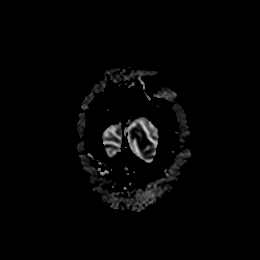

[29 of 48 positions shown; findings below may reference images not displayed]

FINDINGS: MRI HEAD FINDINGS

Brain: No acute infarct, mass effect or extra-axial collection. No
acute or chronic hemorrhage. There is multifocal hyperintense
T2-weighted signal within the white matter. Generalized volume loss
without a clear lobar predilection. The midline structures are
normal.

Vascular: Major flow voids are preserved.

Skull and upper cervical spine: Normal calvarium and skull base.
Visualized upper cervical spine and soft tissues are normal.

Sinuses/Orbits:No paranasal sinus fluid levels or advanced mucosal
thickening. No mastoid or middle ear effusion. Normal orbits.

MRA HEAD FINDINGS

POSTERIOR CIRCULATION:

--Vertebral arteries: Normal

--Inferior cerebellar arteries: Normal.

--Basilar artery: Normal.

--Superior cerebellar arteries: Normal.

--Posterior cerebral arteries: Normal.

ANTERIOR CIRCULATION:

--Intracranial internal carotid arteries: Normal.

--Anterior cerebral arteries (ACA): Normal.

--Middle cerebral arteries (MCA): Normal.

ANATOMIC VARIANTS: Bilateral fetal origins of the PCAs
IMPRESSION: 1. No acute intracranial abnormality.
2. Normal intracranial MRA.
3. Moderate chronic small vessel disease and volume loss.

## 2020-09-17 IMAGING — MR MR HEAD W/O CM
2 series · 48 of 48 positions shown · non-contrast
Comparison: None.

CLINICAL DATA: Vision loss.

EXAM:
MRI HEAD WITHOUT CONTRAST
MRA HEAD WITHOUT CONTRAST
TECHNIQUE: Multiplanar, multiecho pulse sequences of the brain and surrounding
structures were obtained without intravenous contrast. Angiographic
images of the head were obtained using MRA technique without
contrast.

[Series 7: DWI · coronal · 4.0mm · 0.88mm/px · 32 of 68 slices shown (1 of 2)]
[im 1/68]
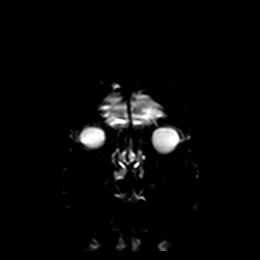
[im 3/68]
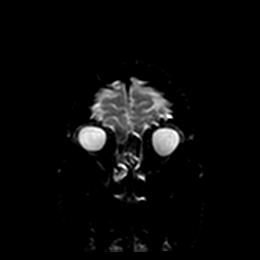
[im 5/68]
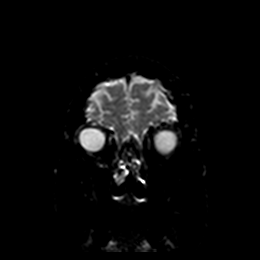
[im 7/68]
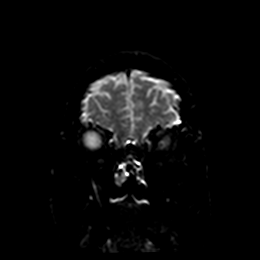
[im 9/68]
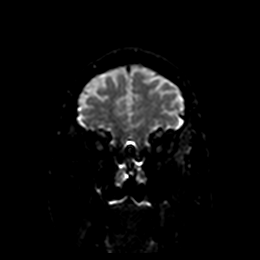
[im 11/68]
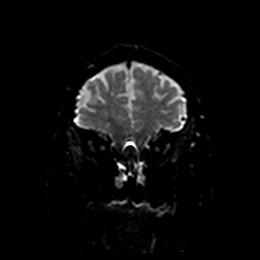
[im 13/68]
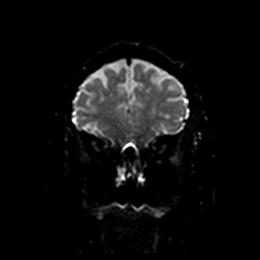
[im 16/68]
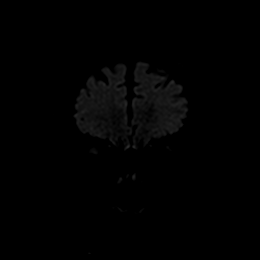
[im 18/68]
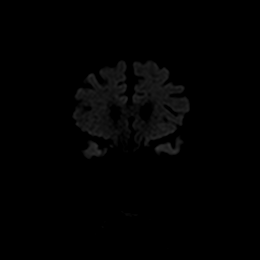
[im 20/68]
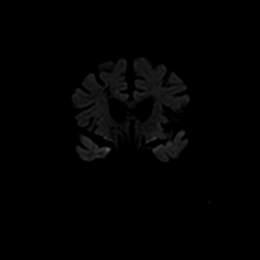
[im 22/68]
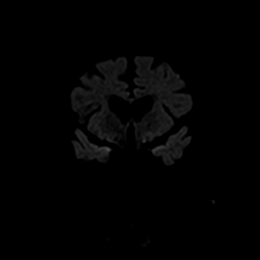
[im 24/68]
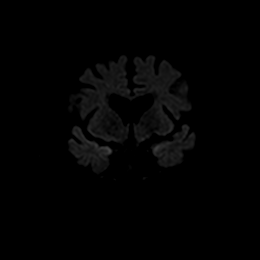
[im 26/68]
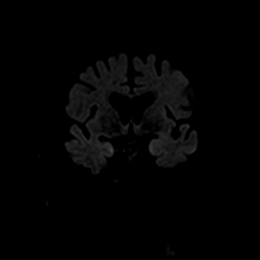
[im 29/68]
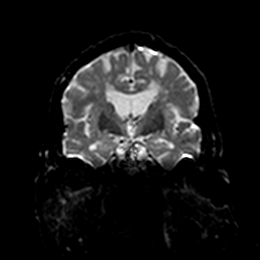
[im 31/68]
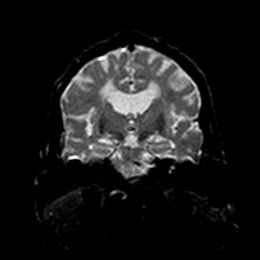
[im 33/68]
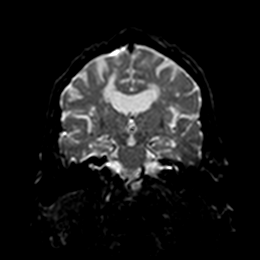
[im 35/68]
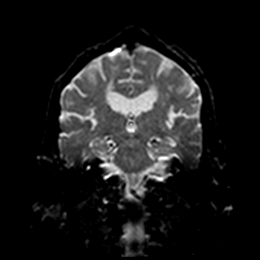
[im 37/68]
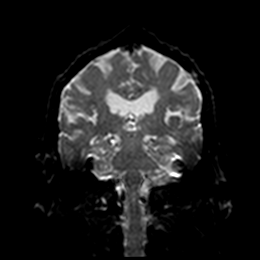
[im 39/68]
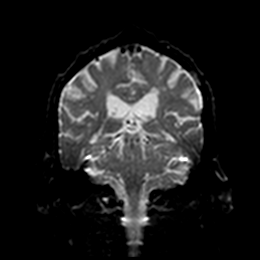
[im 42/68]
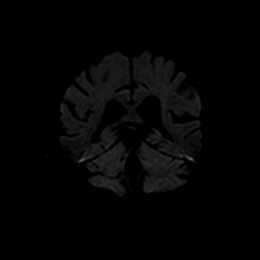
[im 44/68]
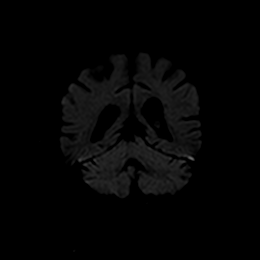
[im 46/68]
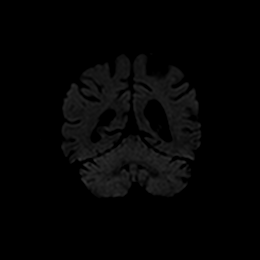
[im 48/68]
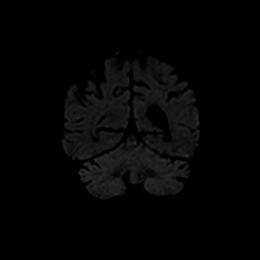
[im 50/68]
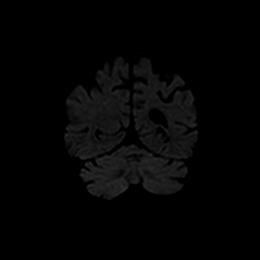
[im 52/68]
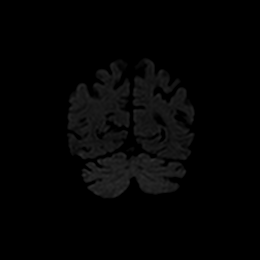
[im 55/68]
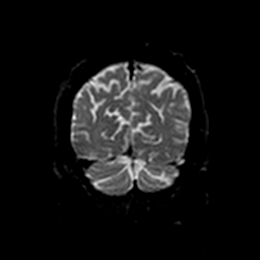
[im 57/68]
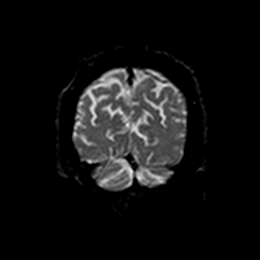
[im 59/68]
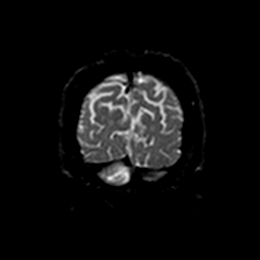
[im 61/68]
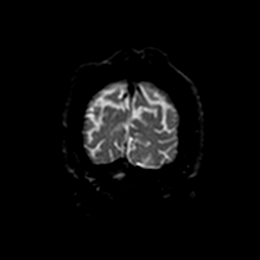
[im 63/68]
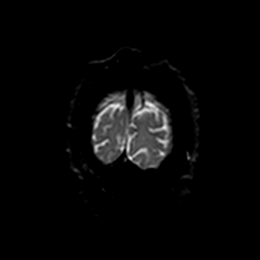
[im 65/68]
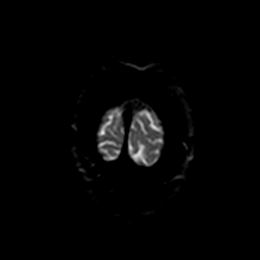
[im 68/68]
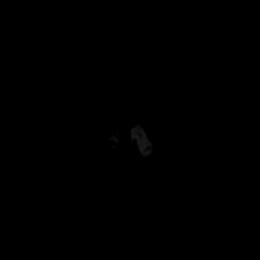

[Series 8: DWI · coronal · 4.0mm · 0.88mm/px · 16 of 34 slices shown (2 of 2)]
[im 1/34]
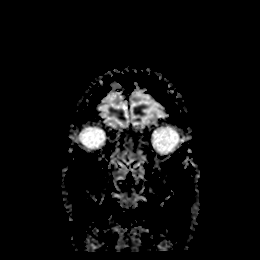
[im 3/34]
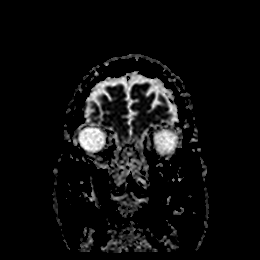
[im 5/34]
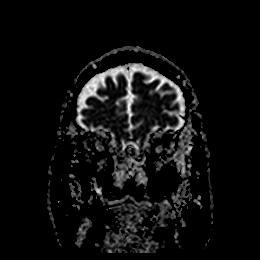
[im 7/34]
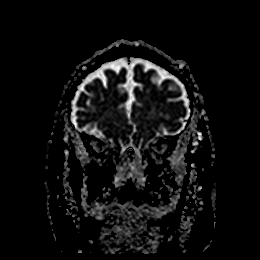
[im 9/34]
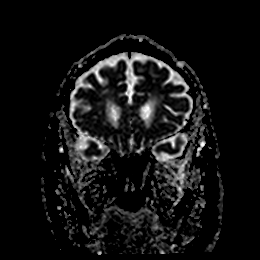
[im 12/34]
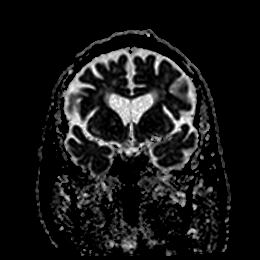
[im 14/34]
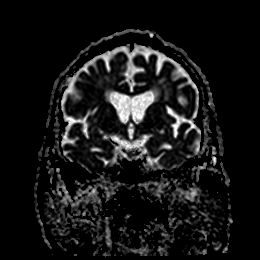
[im 16/34]
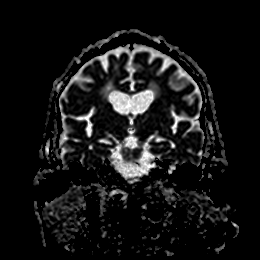
[im 18/34]
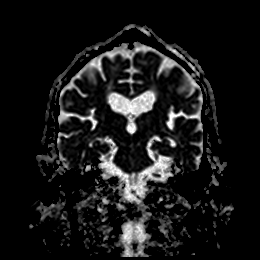
[im 20/34]
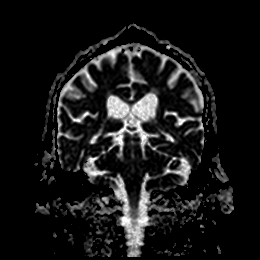
[im 23/34]
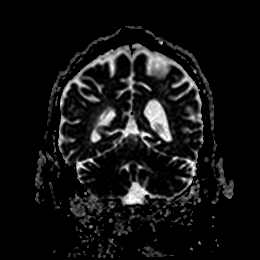
[im 25/34]
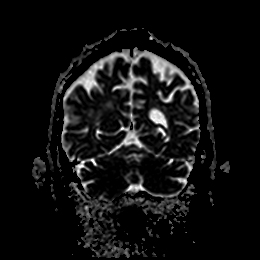
[im 27/34]
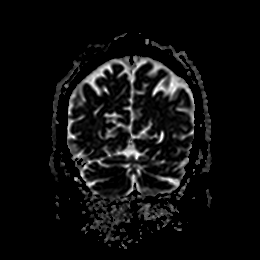
[im 29/34]
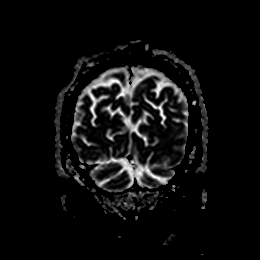
[im 31/34]
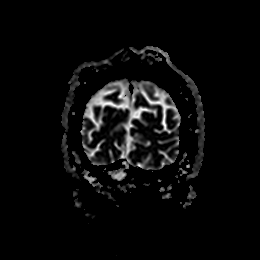
[im 34/34]
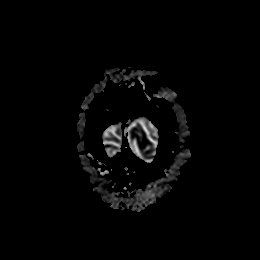

[48 of 48 positions shown; findings below may reference images not displayed]

FINDINGS: MRI HEAD FINDINGS

Brain: No acute infarct, mass effect or extra-axial collection. No
acute or chronic hemorrhage. There is multifocal hyperintense
T2-weighted signal within the white matter. Generalized volume loss
without a clear lobar predilection. The midline structures are
normal.

Vascular: Major flow voids are preserved.

Skull and upper cervical spine: Normal calvarium and skull base.
Visualized upper cervical spine and soft tissues are normal.

Sinuses/Orbits:No paranasal sinus fluid levels or advanced mucosal
thickening. No mastoid or middle ear effusion. Normal orbits.

MRA HEAD FINDINGS

POSTERIOR CIRCULATION:

--Vertebral arteries: Normal

--Inferior cerebellar arteries: Normal.

--Basilar artery: Normal.

--Superior cerebellar arteries: Normal.

--Posterior cerebral arteries: Normal.

ANTERIOR CIRCULATION:

--Intracranial internal carotid arteries: Normal.

--Anterior cerebral arteries (ACA): Normal.

--Middle cerebral arteries (MCA): Normal.

ANATOMIC VARIANTS: Bilateral fetal origins of the PCAs
IMPRESSION: 1. No acute intracranial abnormality.
2. Normal intracranial MRA.
3. Moderate chronic small vessel disease and volume loss.

## 2020-09-17 IMAGING — MR MR HEAD W/O CM
10 of 11 series · 43 of 48 positions shown · non-contrast
Comparison: None.

CLINICAL DATA: Vision loss.

EXAM:
MRI HEAD WITHOUT CONTRAST
MRA HEAD WITHOUT CONTRAST
TECHNIQUE: Multiplanar, multiecho pulse sequences of the brain and surrounding
structures were obtained without intravenous contrast. Angiographic
images of the head were obtained using MRA technique without
contrast.

[Series 5: DWI · axial · 3.0mm · 0.88mm/px · z∈[-97,+55]mm · 10 of 104 slices shown (1 of 2)]
[im 1/104]
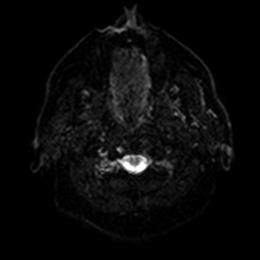
[im 12/104]
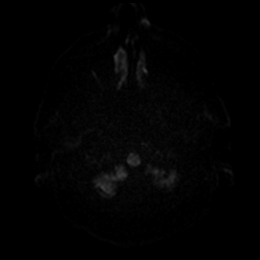
[im 23/104]
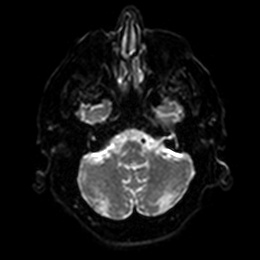
[im 35/104]
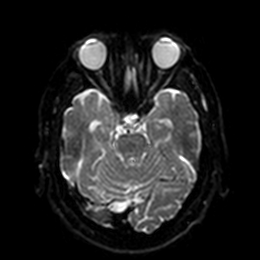
[im 46/104]
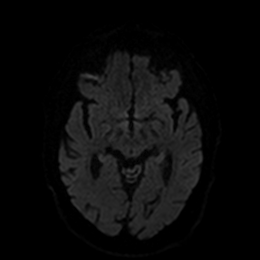
[im 58/104]
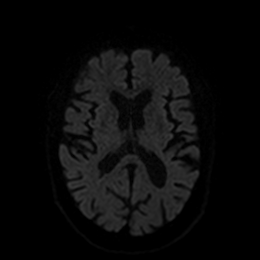
[im 69/104]
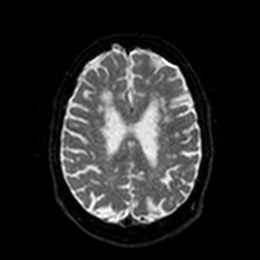
[im 81/104]
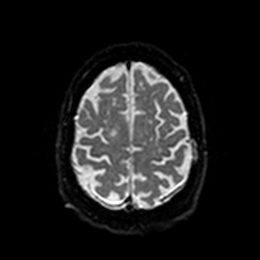
[im 92/104]
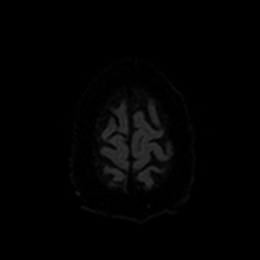
[im 104/104]
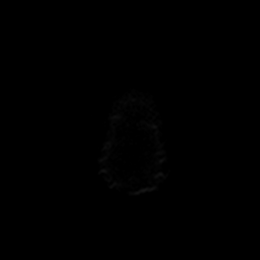

[Series 6: DWI · axial · 3.0mm · 0.88mm/px · z∈[-97,+55]mm · 5 of 52 slices shown (2 of 2)]
[im 1/52]
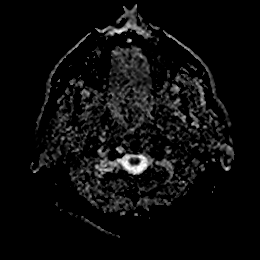
[im 13/52]
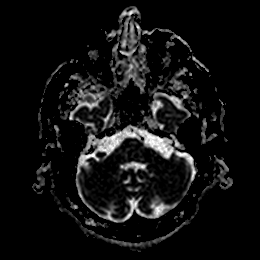
[im 26/52]
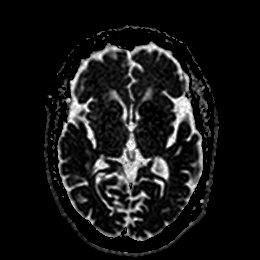
[im 39/52]
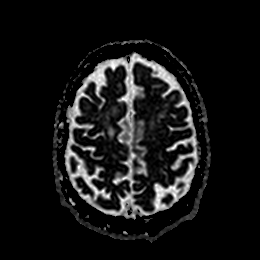
[im 52/52]
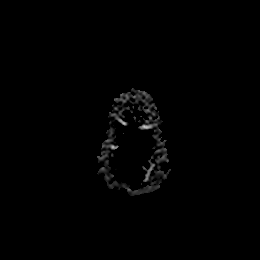

[Series 11: T1 · sagittal · 5.0mm · 0.75mm/px · 2 of 23 slices shown]
[im 1/23]
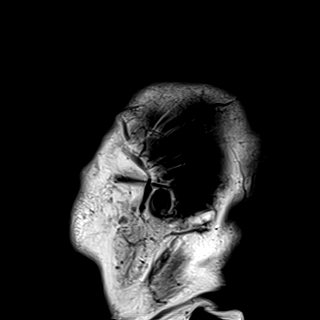
[im 23/23]
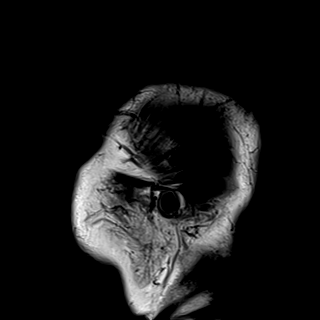

[Series 12: T2 · axial · 5.0mm · 0.72mm/px · z∈[-96,+54]mm · 2 of 26 slices shown (1 of 2)]
[im 1/26]
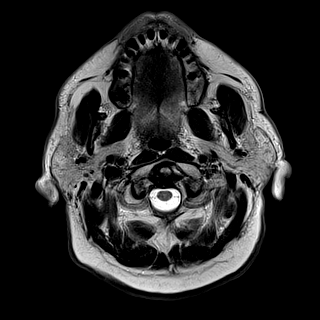
[im 26/26]
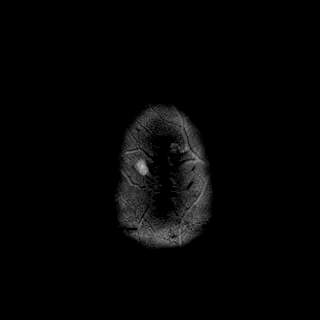

[Series 13: FLAIR · axial · 5.0mm · 0.45mm/px · z∈[-97,+53]mm · 2 of 26 slices shown]
[im 1/26]
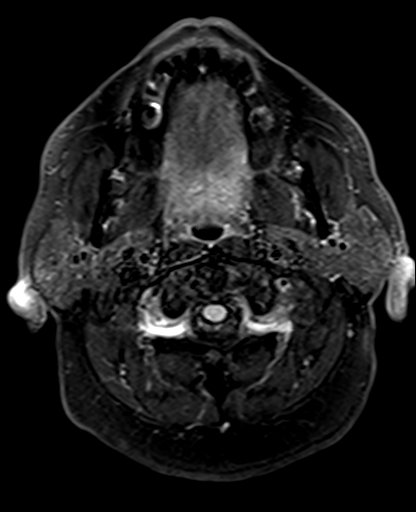
[im 26/26]
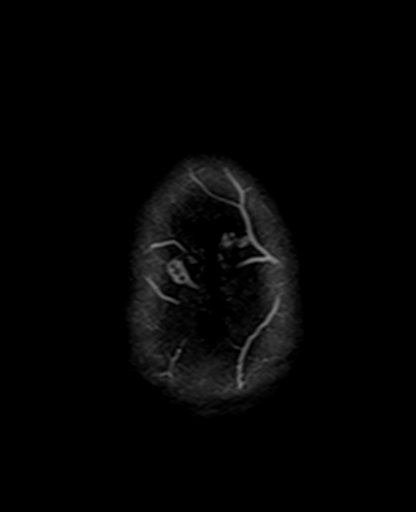

[Series 14: mag_images · axial · 3.0mm · 0.90mm/px · z∈[-98,+54]mm · 5 of 52 slices shown]
[im 1/52]
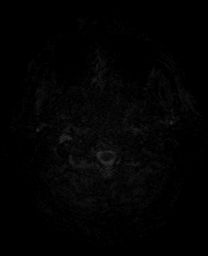
[im 13/52]
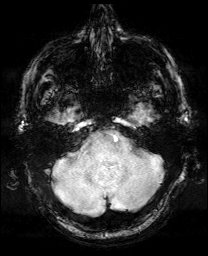
[im 26/52]
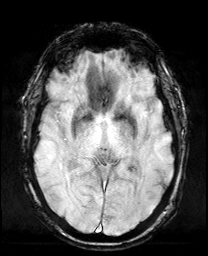
[im 39/52]
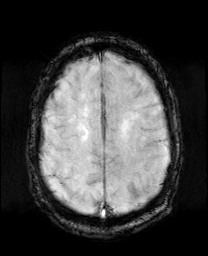
[im 52/52]
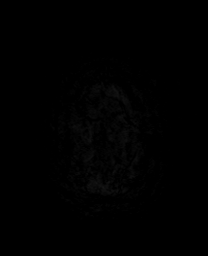

[Series 15: pha_images · axial · 3.0mm · 0.90mm/px · z∈[-98,+54]mm · 5 of 52 slices shown]
[im 1/52]
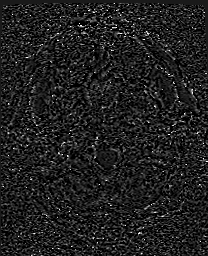
[im 13/52]
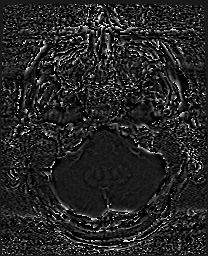
[im 26/52]
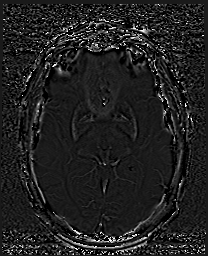
[im 39/52]
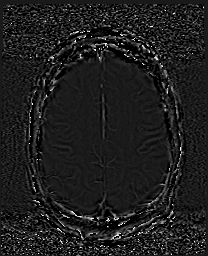
[im 52/52]
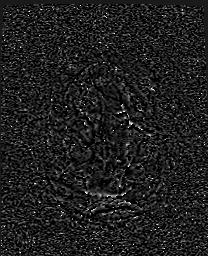

[Series 16: swi_images · axial · 3.0mm · 0.90mm/px · z∈[-98,+54]mm · 5 of 52 slices shown]
[im 1/52]
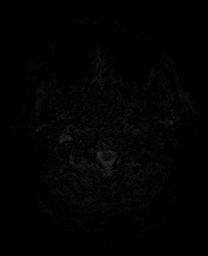
[im 13/52]
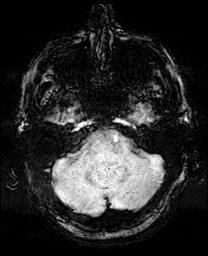
[im 26/52]
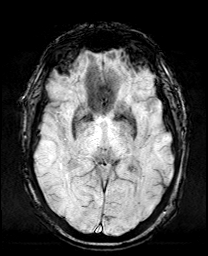
[im 39/52]
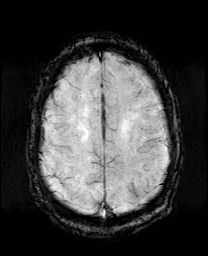
[im 52/52]
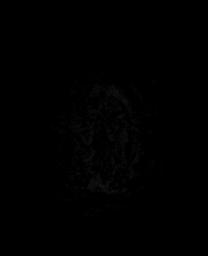

[Series 17: mip_images(sw) · axial · 24.0mm · 0.90mm/px · z∈[-88,+44]mm · 4 of 45 slices shown]
[im 1/45]
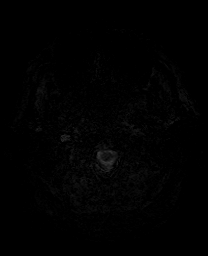
[im 15/45]
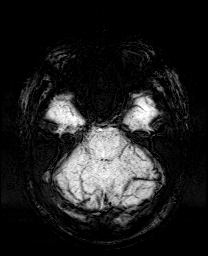
[im 30/45]
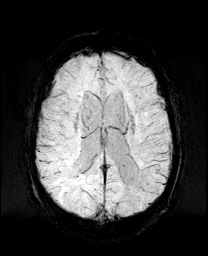
[im 45/45]
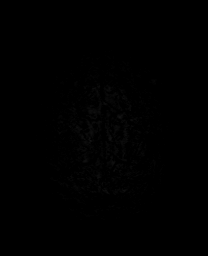

[Series 19: T2 · coronal · 5.0mm · 0.34mm/px · 3 of 29 slices shown (2 of 2)]
[im 1/29]
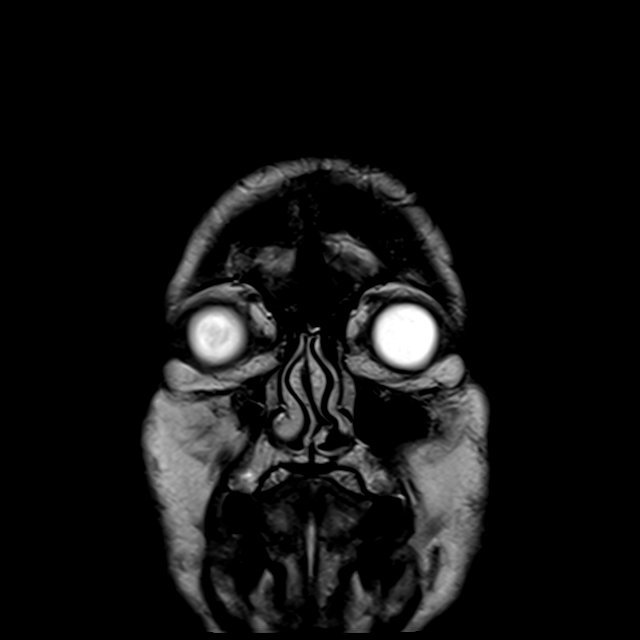
[im 15/29]
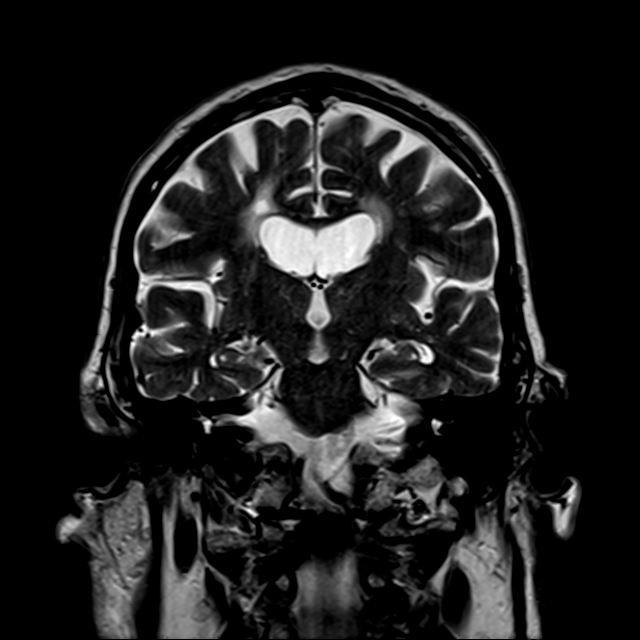
[im 29/29]
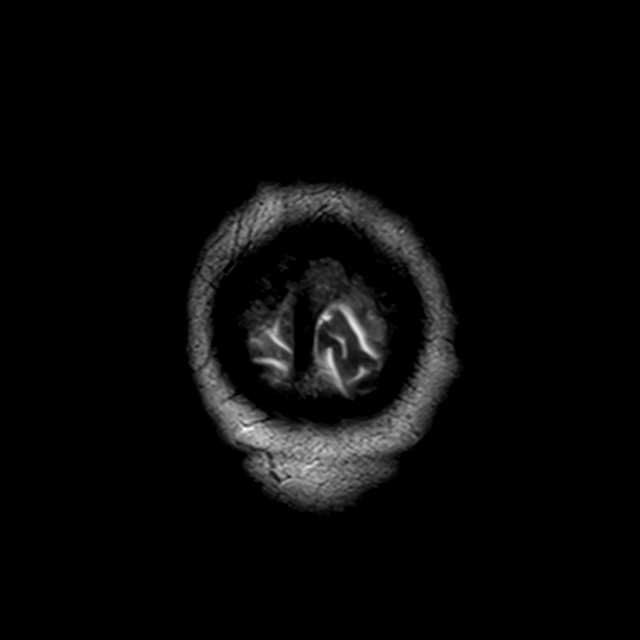

[43 of 48 positions shown; findings below may reference images not displayed]

FINDINGS: MRI HEAD FINDINGS

Brain: No acute infarct, mass effect or extra-axial collection. No
acute or chronic hemorrhage. There is multifocal hyperintense
T2-weighted signal within the white matter. Generalized volume loss
without a clear lobar predilection. The midline structures are
normal.

Vascular: Major flow voids are preserved.

Skull and upper cervical spine: Normal calvarium and skull base.
Visualized upper cervical spine and soft tissues are normal.

Sinuses/Orbits:No paranasal sinus fluid levels or advanced mucosal
thickening. No mastoid or middle ear effusion. Normal orbits.

MRA HEAD FINDINGS

POSTERIOR CIRCULATION:

--Vertebral arteries: Normal

--Inferior cerebellar arteries: Normal.

--Basilar artery: Normal.

--Superior cerebellar arteries: Normal.

--Posterior cerebral arteries: Normal.

ANTERIOR CIRCULATION:

--Intracranial internal carotid arteries: Normal.

--Anterior cerebral arteries (ACA): Normal.

--Middle cerebral arteries (MCA): Normal.

ANATOMIC VARIANTS: Bilateral fetal origins of the PCAs
IMPRESSION: 1. No acute intracranial abnormality.
2. Normal intracranial MRA.
3. Moderate chronic small vessel disease and volume loss.

## 2020-09-17 NOTE — Discharge Instructions (Signed)
Your evaluation in the emergency department this evening was reassuring.  We recommend follow-up with your primary care doctor.  We also advise you see an ophthalmologist given your complaints of peripheral vision changes.  If you do not have an ophthalmologist, follow-up with Dr. Sherlynn Stalls in his office.  Call to schedule an appointment.  Continue your daily medications and return for any new or concerning symptoms.

## 2020-09-17 NOTE — ED Notes (Signed)
Pt transported to MRI 

## 2020-09-18 ENCOUNTER — Encounter: Payer: Self-pay | Admitting: Student

## 2020-09-18 ENCOUNTER — Ambulatory Visit: Payer: Medicare Other | Admitting: Student

## 2020-09-18 ENCOUNTER — Other Ambulatory Visit: Payer: Self-pay

## 2020-09-18 VITALS — BP 116/72 | HR 86 | Ht 66.5 in | Wt 188.0 lb

## 2020-09-18 DIAGNOSIS — E118 Type 2 diabetes mellitus with unspecified complications: Secondary | ICD-10-CM

## 2020-09-18 DIAGNOSIS — G4733 Obstructive sleep apnea (adult) (pediatric): Secondary | ICD-10-CM

## 2020-09-18 DIAGNOSIS — I10 Essential (primary) hypertension: Secondary | ICD-10-CM | POA: Diagnosis not present

## 2020-09-18 DIAGNOSIS — I2510 Atherosclerotic heart disease of native coronary artery without angina pectoris: Secondary | ICD-10-CM

## 2020-09-18 DIAGNOSIS — Z951 Presence of aortocoronary bypass graft: Secondary | ICD-10-CM | POA: Diagnosis not present

## 2020-09-18 DIAGNOSIS — N183 Chronic kidney disease, stage 3 unspecified: Secondary | ICD-10-CM

## 2020-09-18 DIAGNOSIS — E785 Hyperlipidemia, unspecified: Secondary | ICD-10-CM

## 2020-09-18 DIAGNOSIS — I441 Atrioventricular block, second degree: Secondary | ICD-10-CM

## 2020-09-18 NOTE — Patient Instructions (Signed)
Medication Instructions:  Continue current medications  *If you need a refill on your cardiac medications before your next appointment, please call your pharmacy*   Lab Work: None Ordered   Testing/Procedures: None Ordered   Follow-Up: At [facility], you and your health needs are our priority.  As part of our continuing mission to provide you with exceptional heart care, we have created designated Provider Care Teams.  These Care Teams include your primary Cardiologist (physician) and Advanced Practice Providers (APPs -  Physician Assistants and Nurse Practitioners) who all work together to provide you with the care you need, when you need it.  We recommend signing up for the patient portal called "MyChart".  Sign up information is provided on this After Visit Summary.  MyChart is used to connect with patients for Virtual Visits (Telemedicine).  Patients are able to view lab/test results, encounter notes, upcoming appointments, etc.  Non-urgent messages can be sent to your provider as well.   To learn more about what you can do with MyChart, go to [email or web address].    Your next appointment:   6 month(s)  The format for your next appointment:   In Person  Provider:   You may see Lubna Stegeman Kelly, MD or one of the following Advanced Practice Providers on your designated Care Team:    Hao Meng, PA-C  Angela Duke, PA-C or   Krista Kroeger, PA-C     

## 2020-09-25 ENCOUNTER — Other Ambulatory Visit: Payer: Self-pay | Admitting: Cardiovascular Disease

## 2020-09-25 ENCOUNTER — Other Ambulatory Visit: Payer: Self-pay | Admitting: Student

## 2020-11-24 DEATH — deceased

## 2021-01-06 ENCOUNTER — Other Ambulatory Visit: Payer: Self-pay | Admitting: Cardiovascular Disease

## 2021-03-17 ENCOUNTER — Encounter: Payer: Self-pay | Admitting: Cardiovascular Disease

## 2021-03-17 ENCOUNTER — Other Ambulatory Visit: Payer: Self-pay

## 2021-03-17 ENCOUNTER — Ambulatory Visit: Payer: Medicare Other | Admitting: Cardiovascular Disease

## 2021-03-17 VITALS — BP 116/52 | HR 64 | Ht 66.5 in | Wt 183.0 lb

## 2021-03-17 DIAGNOSIS — I10 Essential (primary) hypertension: Secondary | ICD-10-CM

## 2021-03-17 DIAGNOSIS — G4730 Sleep apnea, unspecified: Secondary | ICD-10-CM

## 2021-03-17 DIAGNOSIS — I2510 Atherosclerotic heart disease of native coronary artery without angina pectoris: Secondary | ICD-10-CM

## 2021-03-17 DIAGNOSIS — Z951 Presence of aortocoronary bypass graft: Secondary | ICD-10-CM | POA: Diagnosis not present

## 2021-03-17 DIAGNOSIS — E118 Type 2 diabetes mellitus with unspecified complications: Secondary | ICD-10-CM

## 2021-03-17 DIAGNOSIS — I441 Atrioventricular block, second degree: Secondary | ICD-10-CM

## 2021-03-17 DIAGNOSIS — I493 Ventricular premature depolarization: Secondary | ICD-10-CM

## 2021-03-17 MED ORDER — METOPROLOL SUCCINATE ER 25 MG PO TB24: ORAL_TABLET | ORAL | 2 refills | Status: DC

## 2021-03-17 NOTE — Patient Instructions (Signed)
Medication Instructions:  CHANGE how to take your Metoprolol - change to every other day.   *If you need a refill on your cardiac medications before your next appointment, please call your pharmacy*   Follow-Up: At [facility], you and your health needs are our priority.  As part of our continuing mission to provide you with exceptional heart care, we have created designated Provider Care Teams.  These Care Teams include your primary Cardiologist (physician) and Advanced Practice Providers (APPs -  Physician Assistants and Nurse Practitioners) who all work together to provide you with the care you need, when you need it.  We recommend signing up for the patient portal called "MyChart".  Sign up information is provided on this After Visit Summary.  MyChart is used to connect with patients for Virtual Visits (Telemedicine).  Patients are able to view lab/test results, encounter notes, upcoming appointments, etc.  Non-urgent messages can be sent to your provider as well.   To learn more about what you can do with MyChart, go to [name].    Your next appointment:   6 month(s)  The format for your next appointment:   In Person  Provider:   Shelva Majestic, MD

## 2021-03-17 NOTE — Progress Notes (Signed)
Patient ID: Peter Mullen, male   DOB: 01-03-1932, 87 y.o.   MRN: [id number]    Primary M.D.: Dr. Delfino Lovett Tisoivic  HPI: Peter Mullen is a 87 y.o. male who presents to the office today for an 95 month follow-up cardiology evaluation.    Peter Mullen has established CAD and in March 2001 underwent CABG revascularization surgery with a LIMA to the LAD, SVG to diagonal, SVG to the PLA, and SVG to the PDA vessel. Additional medical problems include hypertension, type 2 diabetes mellitus, mild obstructive sleep apnea not on CPAP therapy and obesity.   In October 2012 an nuclear perfusion study showed normal perfusion with a post stress ejection fraction of 62%. He has documented grade 1 diastolic dysfunction EA ratio 0.71.   A 2-D echo irevealed mild aortic sclerosis without stenosis, trace MR and trace TR. He has a history of mixed hyperlipidemia. He sees Dr. [name] for his primary medical care. Laboratory showed a normal chemistry profile, cholesterol 93, triglycerides, 110 HDL 31, and LDL 40 on his current medical regimen.  A two-year followup nuclear perfusion study on 06/30/2013 was low risk and demonstrated apical thinning with increased attenuation on resting images without significant ischemia. He did have mild LV dysfunction with an ejection fraction of 47% and focal apical inferior hypocontractility.    On 09/20/2013 a follow-up echo Doppler study showed an ejection fraction of 55% without diagnostic regional wall motion abnormalities although septal bounce was noted. He had grade 1 diastolic dysfunction. He had moderate calcification of his aortic valve without stenosis and there was moderate calcification of the mitral annulus with mildly calcified leaflets without significant regurgitation. His left atrium was mildly dilated. He had normal pulmonary pressures.  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  At that time, he had been on Toprol 100 mg daily and I  reduced this to 50 mg per day.  He has continued to take losartan 100 mg, HCTZ in addition to terazocin for hypertension.   In 2017 he had broken 2 of his molars and has been evaluated at the dental clinic at [facility].  He has been on chronic Plavix.  He had noticed that his blood pressure has been consistently elevated despite taking his vacations which include HCTZ 25 mg, losartan 100 mg, Toprol-XL 25 mg, and Hytrin 5 mg daily.  I added amlodipine 5 mg to his medical regimen for improved blood pressure control.  He had held his Plavix for dental work on his molars.   He is remaining active and typically bowls 2 times per week.  He has been having occasional pain down his left leg which may be attributable to spinal stenosis and in the past he had seen Dr. [name] for this, which had limited his walking.  He will need to have a molar extraction as well as an impacted was some tooth removed at the [place] [facility], which is scheduled for later this month.  He underwent a nuclear perfusion study on 11/03/2016 in follow-up of his remote CABG surgery and this continued to be low risk and showed only a very small prior basal/mid inferoseptal defect without associated ischemia.  Ejection fraction was 55% and he had normal wall motion.    He underwent a nuclear perfusion study in April 2018, which remained low risk and showed a small defect in the basal inferoseptal and mid inferoseptal region consistent with probable scar without associated ischemia.  EF was 55% with normal wall motion.  Laboratory in June 2018 which showed total cholesterol 122, HDL 38, LDL 39, but triglycerides were elevated at 224.  Hemoglobin A1c was 6.2.  TSH was 3.25.     When I last saw him in July 2019 he remained stable and was without chest pain.  He  admitted to some exertional fatigue.  He was  found to have an elevated uric acid level and was just started on allopurinol.  He has been evaluated by Dr. [name] for left leg discomfort with  some left ankle swelling.  He denies any increasing shortness of breath or significant palpitations.    Since his prior evaluation he has continued to do well.  He still experiences occasional leg pain but this has improved.  He had undergone lower extremity Doppler assessment on May 06, 2018 which showed noncompressible ABIs bilaterally but he had normal toe brachial index bilaterally.  He has not had any acute gout flares.  He had recently fallen while bowling and banged his head.  He was hospitalized overnight and was found to have a small subdural hematoma.  He was assessed by Dr. [name] at that time recommended holding Plavix for at least 2 weeks and okay to continue aspirin.  He has been off Plavix since his head trauma May 26, 2018.    I saw him in the office in January 2020 for follow-up Cardiologic evaluation.  At that time he was feeling well and was without chest pain he denied PND orthopnea.  He was sleeping well.  I recommended discontinuance of hydrochlorothiazide due to potential increased uric acid risk and suggested slight titration of spironolactone up to 25 mg daily.  He was evaluated in a telemedicine visit in May 2020.  At that time he was feeling well but continued to have issues with leg swelling.  He had noticed some improvement in his leg swelling with the increase in spironolactone dose but every day by midday he had developed recurrent edema.  He was using compression stockings.  He was bradycardic and his Toprol dose was reduced to 12.5 mg because of continued bilateral leg edema furosemide 20 mg was added to his regimen.  I saw him in September 2020 at which time he was continuing to feel well and denied any anginal symptomatology.  He recently  noticed some mild tenderness in the breast region which had occurred on his increase spironolactone dosing.  He had been taking Spironolactone 25 mg twice a day.  Otherwise, he feels well.  He has had frequent awakenings and has  not been sleeping as well.  Remotely he had decided against pursuing CPAP therapy with remotely diagnosed sleep apnea well.    He developed some mild breast tenderness most likely resulting from spironolactone.  I recommended he reduce his spironolactone to 12.5 mg and further titrated furosemide.  With increased glycerides I recommended Vascepa 2 capsules twice a day.   I saw him  in February 2021.  Over the prior months, he has felt well.  His blood pressure has been stable.  He underwent a follow-up sleep study on May 10, 2019 which was notable for borderline sleep apnea with an AHI of 4.8/h; however, RDI was increased at 33.2/h.  AHI while supine was 6.8/h.  O2 desaturated to 89%.  AHI during REM sleep was 4.4/h.  He has had issues with elevated triglycerides and he was on Vascepa in addition to atorvastatin 40 mg.  We discussed improved sleep hygiene.  I last saw him in September  2021.  Over the past several months, he has continued eating well.  He apparently has only been taking Vascepa 1 g twice a day instead of 2 g twice a day and atorvastatin 20 mg.  Repeat laboratory by Dr. Osborne Casco can 2021 which showed total cholesterol 144, triglycerides elevated at 368.  LDL cholesterol was 35.  Chemistry was normal.  Uric acid was 5.8.  PSA 1.98.  He denies any chest pain or shortness of breath.  When he has to walk long distances out of town he brings with him an Transport planner for improved mobility.  He is sleeping well and did not pursue CPAP.   Since I saw him, he was evaluated by Sande Rives in February 2022.  He was not having any anginal symptoms.  He was on low-dose Toprol-XL and has a history of second-degree type I block and at times heart rate had dropped into the 40s.  Presently, he feels well.  He remains active.  He is bowling 2 times per week.  At times he states his heart rate can drop into the 40s particularly when he is sleeping.  He denies any anginal symptoms or exertional  dyspnea.  He is sleeping well but typically reads late at night and often goes to bed at 1 AM and wakes up around 10 AM.  He continues to be on atorvastatin 20 mg and Vascepa for hyperlipidemia.  He is on olmesartan 20 mg, metoprolol succinate 12.5 mg daily for blood pressure control.  He is diabetic on Jardiance and metformin.  GERD is controlled with Prilosec.  He presents for evaluation.   Past medical history: CAD, status post CABG, hypertension, type 2 diabetes mellitus, mixed hyperlipidemia, obstructive sleep apnea, mild. He is status post removal of a a basal cell cancer removed from his left cheek by Dr. Sarajane Jews.    Past surgical history notable for CABG surgery, cataract surgery 2005 prostate surgery 1989.  Current Outpatient Medications  Medication Sig Dispense Refill   acetaminophen (TYLENOL) 650 MG CR tablet Take 1,300 mg by mouth at bedtime.     allopurinol (ZYLOPRIM) 300 MG tablet Take 300 mg by mouth daily.  5   aspirin 81 MG tablet Take 81 mg by mouth every evening.      atorvastatin (LIPITOR) 40 MG tablet TAKE 1/2 (ONE-HALF) TABLET BY MOUTH ONCE DAILY AT  6PM 45 tablet 10   BIOTIN PO Take 1 tablet by mouth daily.     BLACK ELDERBERRY PO Take 50 mg by mouth 3 (three) times a week.     cetirizine (ZYRTEC) 10 MG tablet Take 10 mg by mouth at bedtime.     Cholecalciferol (VITAMIN D3) 50 MCG (2000 UT) capsule Take 2,000 Units by mouth 3 (three) times a week.     colchicine 0.6 MG tablet Take 0.6 mg by mouth daily as needed (gout flares).     Cyanocobalamin (VITAMIN B12 PO) Take 1,000 mg by mouth 3 (three) times a week.     diphenhydrAMINE (BENADRYL) 25 mg capsule Take 25 mg by mouth at bedtime as needed for sleep.     doxycycline (VIBRAMYCIN) 100 MG capsule Take 100 mg by mouth See admin instructions. Bid x 5 days     empagliflozin (JARDIANCE) 25 MG TABS tablet Take 12.5 mg by mouth daily.     fluticasone (FLONASE) 50 MCG/ACT nasal spray Place 1 spray into both nostrils every  evening.     folic acid (FOLVITE) 034 MCG tablet Take 400 mcg  by mouth 3 (three) times a week.     furosemide (LASIX) 40 MG tablet Take 1 tablet by mouth once daily 90 tablet 3   guaiFENesin (MUCINEX) 600 MG 12 hr tablet Take 600 mg by mouth daily.     icosapent Ethyl (VASCEPA) 1 g capsule Take 2 capsules (2 g total) by mouth 2 (two) times daily. 120 capsule 2   Melatonin 10 MG TABS Take 10 mg by mouth at bedtime.     metFORMIN (GLUCOPHAGE) 500 MG tablet Take 500 mg by mouth 2 (two) times daily with a meal.      montelukast (SINGULAIR) 10 MG tablet Take 10 mg by mouth at bedtime.      Multiple Vitamins-Minerals (ZINC PO) Take 1 tablet by mouth 3 (three) times a week.     nitroGLYCERIN (NITROSTAT) 0.4 MG SL tablet Place 1 tablet under the tongue daily as needed for chest pain.     olmesartan (BENICAR) 20 MG tablet Take 1 tablet by mouth once daily 90 tablet 0   omeprazole (PRILOSEC) 20 MG capsule Take 20 mg by mouth 2 (two) times daily before a meal.     oxymetazoline (AFRIN) 0.05 % nasal spray Place 1 spray into both nostrils at bedtime.     testosterone (ANDROGEL) 50 MG/5GM GEL Place 5 g onto the skin 2 (two) times daily.      vitamin E 400 UNIT capsule Take 400 Units by mouth 3 (three) times a week.     metoprolol succinate (TOPROL-XL) 25 MG 24 hr tablet Take 1/2 (one-half) tablet by mouth every other day 30 tablet 2   No current facility-administered medications for this visit.    He is widowed per history children 4 grandchildren. There is no tobacco use. He does drink occasional alcohol.  ROS General: Negative; No fevers, chills, or night sweats;  HEENT: Positive for cracks in his molars; No changes in vision or hearing, sinus congestion, difficulty swallowing Pulmonary: Negative; No cough, wheezing, shortness of breath, hemoptysis Cardiovascular: Negative; No chest pain, presyncope, syncope, palpitations GI: Negative; No nausea, vomiting, diarrhea, or abdominal pain GU: Negative; No  dysuria, hematuria, or difficulty voiding Musculoskeletal: Positive for back pain with leg discomfort secondary to spinal stenosis; intermittent left leg discomfort being evaluated by Dr. Lindwood Qua Rheumatologic: Gout Hematologic/Oncology: Negative; no easy bruising, bleeding Endocrine: Negative; no heat/cold intolerance; no diabetes Neuro: Negative; no changes in balance, headaches Skin: history of removal of prior basal cell carcinoma with clear margins; No rashes or skin lesions Psychiatric: Negative; No behavioral problems, depression Sleep: mild sleep apnea, not on CPAP therapy; No  daytime sleepiness, hypersomnolence, bruxism, restless legs, hypnogognic hallucinations, no cataplexy Other comprehensive 14 point system review is negative.   PE BP (!) 116/52 (BP Location: Left Arm, Patient Position: Sitting, Cuff Size: Normal)   Pulse 64   Ht 5' 6.5" (1.689 m)   Wt 183 lb (83 kg)   BMI 29.09 kg/m    Repeat blood pressure by me was 122/60  Wt Readings from Last 3 Encounters:  03/17/21 183 lb (83 kg)  09/18/20 188 lb (85.3 kg)  09/16/20 183 lb (83 kg)   General: Alert, oriented, no distress.  Skin: normal turgor, no rashes, warm and dry HEENT: Normocephalic, atraumatic. Pupils equal round and reactive to light; sclera anicteric; extraocular muscles intact;  Nose without nasal septal hypertrophy Mouth/Parynx benign; Mallinpatti scale 3 Neck: No JVD, no carotid bruits; normal carotid upstroke Lungs: clear to ausculatation and percussion; no wheezing or rales  Chest wall: without tenderness to palpitation Heart: PMI not displaced, RRR, s1 s2 normal, 1/6 systolic murmur, no diastolic murmur, no rubs, gallops, thrills, or heaves Abdomen: soft, nontender; no hepatosplenomehaly, BS+; abdominal aorta nontender and not dilated by palpation. Back: no CVA tenderness Pulses 2+ Musculoskeletal: full range of motion, normal strength, no joint deformities Extremities: no clubbing cyanosis  or edema, Homan's sign negative  Neurologic: grossly nonfocal; Cranial nerves grossly wnl Psychologic: Normal mood and affect   ECG (independently read by me):  Sinus rhythm, 2nd degree Mobitz I, PVCs, LAHB, IRBBB  September 2021 ECG (independently read by me): Sinus rhythm with PACs, first-degree AV block with PR interval 288 ms.  Left bundle branch block.  February 2021 ECG (independently read by me): Normal sinus rhythm with first-degree AV block and an isolated PVC 2015 checked intermittently at this time.  [employer],  September 2020 ECG (independently read by me): Sinus rhythm 82 bpm with PACs and blocked PACs.  January 2020 ECG (independently read by me): Sinus rhythm at 79 bpm with first-degree AV block with appeared normal at 248 ms.  Left bundle branch block and left axis deviation.  July 2019 ECG (independently read by me): Sinus rhythm with first-degree AV block, frequent PVCs with transient bigeminy.  QTc interval 451 ms.  PR interval 242 ms.  November 2018 ECG (independently read by me): Difficult to discern but probable sinus rhythm with PACs  October 2017 ECG (independently read by me): Normal sinus rhythm with first-degree AV block.  PAC.  Left bundle branch block.  July 2017 ECG (independently read by me): Sinus rhythm at 64 bpm.  PACs, nonspecific interventricular block.  First-degree AV block.  April 2017 ECG (independently read by me): Normal sinus rhythm at 64 bpm with occasional PVC and sinus arrhythmia.  First-degree AV block with a PR interval at 278 ms.  Blocked APC.  January 2017 ECG (independently read by me):  Sinus rhythm marked first-degree block.there also was a blocked APC.   He was a suggestion of possible 2nd degree Wenkebach block.  July 2016 ECG (independently read by me): Sinus rhythm with first-degree AV block with a PR interval at 248 ms.  May 2016 ECG (independently read by me): Probable 2nd degree Wenkebach block  November 2015 ECG  (independently read by me): normal sinus rhythm with first-degree AV block with a PR interval at 230 milliseconds.  March 2015 ECG: (independently read by me) sinus rhythm at 73 beats per minute. First degree block with PR interval 248 ms. Isolated unifocal PVCs.   LABS:  BMP Latest Ref Rng & Units 09/16/2020 09/16/2020 01/15/2020  Glucose 70 - 99 mg/dL 158(H) 159(H) 119(H)  BUN 8 - 23 mg/dL 24(H) 27(H) 29(H)  Creatinine 0.61 - 1.24 mg/dL 1.77(H) 1.80(H) 1.75(H)  BUN/Creat Ratio 10 - 24 - - 17  Sodium 135 - 145 mmol/L 138 140 142  Potassium 3.5 - 5.1 mmol/L 3.9 3.9 4.1  Chloride 98 - 111 mmol/L 101 102 105  CO2 22 - 32 mmol/L 24 - 18(L)  Calcium 8.9 - 10.3 mg/dL 9.7 - 9.4   Hepatic Function Latest Ref Rng & Units 09/16/2020 11/25/2015 10/28/2013  Total Protein 6.5 - 8.1 g/dL 7.1 6.6 7.5  Albumin 3.5 - 5.0 g/dL 4.0 4.2 3.9  AST 15 - 41 U/L 35 29 35  ALT 0 - 44 U/L 32 26 26  Alk Phosphatase 38 - 126 U/L 69 59 30(L)  Total Bilirubin 0.3 - 1.2 mg/dL 1.0 0.6 0.5  CBC Latest Ref Rng & Units 09/16/2020 09/16/2020 05/26/2018  WBC 4.0 - 10.5 K/uL 11.1(H) - 7.2  Hemoglobin 13.0 - 17.0 g/dL 16.6 18.0(H) 13.9  Hematocrit 39.0 - 52.0 % 52.3(H) 53.0(H) 44.3  Platelets 150 - 400 K/uL 216 - 212   Lab Results  Component Value Date   TSH 3.25 11/25/2015    Lab Results  Component Value Date   HGBA1C 6.8 (H) 11/25/2015    Lipid Panel     Component Value Date/Time   CHOL 101 (L) 11/25/2015 1343   TRIG 170 (H) 11/25/2015 1343   HDL 42 11/25/2015 1343   CHOLHDL 2.4 11/25/2015 1343   VLDL 34 (H) 11/25/2015 1343   LDLCALC 25 11/25/2015 1343     RADIOLOGY: No results found.  IMPRESSION:  1. Essential hypertension   2. Coronary artery disease involving native coronary artery of native heart without angina pectoris   3. Hx of CABG   4. Second degree AV block, Mobitz type I   5. PVC's (premature ventricular contractions)   6. Type 2 diabetes mellitus with complication, without long-term  current use of insulin (HCC)   7. Sleep apnea, unspecified type    ASSESSMENT AND PLAN:  Peter Mullen is an 87 year old gentleman who is status post CABG revascularization surgery in 2001. At that time, a preoperative nuclear perfusion study was done prior to him undergoing potential donor bone marrow transplantation for his brother which was abnormal and led to his catheterization and ultimate detection of significant multivessel CAD. He was asymptomatic without chest pain.  He has a history of hypertension and  has been demonstrated to have transient had second-degree AV block, type I.  He has had issues with leg edema and in the past gout.  Previously had been on spironolactone but this was ultimately discontinued due to breast tenderness.  His blood pressure today remained stable on his current regimen consisting of furosemide 40 mg, metoprolol succinate 12.5 mg daily and olmesartan 20 mg daily.  His ECG today continues to show second-degree type I Wenkebach.  At times he is noted that his pulse may drop down into the 40s.  I have recommended he try reducing metoprolol succinate to 12.5 mg every other day.  He has incomplete right bundle branch block, left anterior hemiblock, and PVCs.  On Vascepa 2 capsules twice a day and atorvastatin 40 mg daily for mixed hyperlipidemia.  Dr. Osborne Casco has been checking his laboratory.  He is diabetic on Jardiance in addition to metformin.  He is sleeping well and never pursued CPAP therapy.  He is averaging approximately 9 hours of sleep per night by his estimate.  He will monitor his heart rate and blood pressure.  I will see him in 6 months for follow-up evaluation.   Troy Sine, MD, [facility]  03/23/2021 2:05 PM

## 2021-03-23 ENCOUNTER — Encounter: Payer: Self-pay | Admitting: Cardiovascular Disease

## 2021-03-27 ENCOUNTER — Other Ambulatory Visit: Payer: Self-pay | Admitting: Cardiovascular Disease

## 2021-08-27 ENCOUNTER — Ambulatory Visit: Payer: Medicare Other | Admitting: Cardiovascular Disease

## 2021-08-27 ENCOUNTER — Other Ambulatory Visit: Payer: Self-pay

## 2021-08-27 ENCOUNTER — Encounter: Payer: Self-pay | Admitting: Cardiovascular Disease

## 2021-08-27 VITALS — BP 128/64 | HR 77 | Ht 66.0 in | Wt 184.8 lb

## 2021-08-27 DIAGNOSIS — Z951 Presence of aortocoronary bypass graft: Secondary | ICD-10-CM | POA: Diagnosis not present

## 2021-08-27 DIAGNOSIS — I493 Ventricular premature depolarization: Secondary | ICD-10-CM

## 2021-08-27 DIAGNOSIS — I10 Essential (primary) hypertension: Secondary | ICD-10-CM | POA: Diagnosis not present

## 2021-08-27 DIAGNOSIS — E782 Mixed hyperlipidemia: Secondary | ICD-10-CM

## 2021-08-27 DIAGNOSIS — E785 Hyperlipidemia, unspecified: Secondary | ICD-10-CM

## 2021-08-27 DIAGNOSIS — I2510 Atherosclerotic heart disease of native coronary artery without angina pectoris: Secondary | ICD-10-CM | POA: Diagnosis not present

## 2021-08-27 DIAGNOSIS — E118 Type 2 diabetes mellitus with unspecified complications: Secondary | ICD-10-CM

## 2021-08-27 NOTE — Progress Notes (Signed)
Patient ID: Peter Mullen, male   DOB: 29-Dec-1931, 88 y.o.   MRN: [id number]    Primary M.D.: Dr. Delfino Lovett Tisoivic  HPI: Peter Mullen is a 88 y.o. male who presents to the office today for a 6 month follow-up cardiology evaluation.    Peter Mullen has established CAD and in March 2001 underwent CABG revascularization surgery with a LIMA to the LAD, SVG to diagonal, SVG to the PLA, and SVG to the PDA vessel. Additional medical problems include hypertension, type 2 diabetes mellitus, mild obstructive sleep apnea not on CPAP therapy and obesity.   In October 2012 an nuclear perfusion study showed normal perfusion with a post stress ejection fraction of 62%. He has documented grade 1 diastolic dysfunction EA ratio 0.71.   A 2-D echo irevealed mild aortic sclerosis without stenosis, trace MR and trace TR. He has a history of mixed hyperlipidemia. He sees Dr. [name] for his primary medical care. Laboratory showed a normal chemistry profile, cholesterol 93, triglycerides, 110 HDL 31, and LDL 40 on his current medical regimen.  A two-year followup nuclear perfusion study on 06/30/2013 was low risk and demonstrated apical thinning with increased attenuation on resting images without significant ischemia. He did have mild LV dysfunction with an ejection fraction of 47% and focal apical inferior hypocontractility.    On 09/20/2013 a follow-up echo Doppler study showed an ejection fraction of 55% without diagnostic regional wall motion abnormalities although septal bounce was noted. He had grade 1 diastolic dysfunction. He had moderate calcification of his aortic valve without stenosis and there was moderate calcification of the mitral annulus with mildly calcified leaflets without significant regurgitation. His left atrium was mildly dilated. He had normal pulmonary pressures.  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  At that time, he had been on Toprol 100 mg daily and I  reduced this to 50 mg per day.  He has continued to take losartan 100 mg, HCTZ in addition to terazocin for hypertension.   In 2017 he had broken 2 of his molars and has been evaluated at the dental clinic at [facility].  He has been on chronic Plavix.  He had noticed that his blood pressure has been consistently elevated despite taking his vacations which include HCTZ 25 mg, losartan 100 mg, Toprol-XL 25 mg, and Hytrin 5 mg daily.  I added amlodipine 5 mg to his medical regimen for improved blood pressure control.  He had held his Plavix for dental work on his molars.   He is remaining active and typically bowls 2 times per week.  He has been having occasional pain down his left leg which may be attributable to spinal stenosis and in the past he had seen Dr. [name] for this, which had limited his walking.  He will need to have a molar extraction as well as an impacted was some tooth removed at the [facility], which is scheduled for later this month.  He underwent a nuclear perfusion study on 11/03/2016 in follow-up of his remote CABG surgery and this continued to be low risk and showed only a very small prior basal/mid inferoseptal defect without associated ischemia.  Ejection fraction was 55% and he had normal wall motion.    He underwent a nuclear perfusion study in April 2018, which remained low risk and showed a small defect in the basal inferoseptal and mid inferoseptal region consistent with probable scar without associated ischemia.  EF was 55% with normal wall motion.  Laboratory in June 2018 which showed total cholesterol 122, HDL 38, LDL 39, but triglycerides were elevated at 224.  Hemoglobin A1c was 6.2.  TSH was 3.25.     When I last saw him in July 2019 he remained stable and was without chest pain.  He  admitted to some exertional fatigue.  He was  found to have an elevated uric acid level and was just started on allopurinol.  He has been evaluated by Dr. [name] for left leg discomfort with  some left ankle swelling.  He denies any increasing shortness of breath or significant palpitations.    Since his prior evaluation he has continued to do well.  He still experiences occasional leg pain but this has improved.  He had undergone lower extremity Doppler assessment on May 06, 2018 which showed noncompressible ABIs bilaterally but he had normal toe brachial index bilaterally.  He has not had any acute gout flares.  He had recently fallen while bowling and banged his head.  He was hospitalized overnight and was found to have a small subdural hematoma.  He was assessed by Dr. [name] at that time recommended holding Plavix for at least 2 weeks and okay to continue aspirin.  He has been off Plavix since his head trauma May 26, 2018.    I saw him in the office in January 2020 for follow-up Cardiologic evaluation.  At that time he was feeling well and was without chest pain he denied PND orthopnea.  He was sleeping well.  I recommended discontinuance of hydrochlorothiazide due to potential increased uric acid risk and suggested slight titration of spironolactone up to 25 mg daily.  He was evaluated in a telemedicine visit in May 2020.  At that time he was feeling well but continued to have issues with leg swelling.  He had noticed some improvement in his leg swelling with the increase in spironolactone dose but every day by midday he had developed recurrent edema.  He was using compression stockings.  He was bradycardic and his Toprol dose was reduced to 12.5 mg because of continued bilateral leg edema furosemide 20 mg was added to his regimen.  I saw him in September 2020 at which time he was continuing to feel well and denied any anginal symptomatology.  He recently  noticed some mild tenderness in the breast region which had occurred on his increase spironolactone dosing.  He had been taking Spironolactone 25 mg twice a day.  Otherwise, he feels well.  He has had frequent awakenings and has  not been sleeping as well.  Remotely he had decided against pursuing CPAP therapy with remotely diagnosed sleep apnea well.    He developed some mild breast tenderness most likely resulting from spironolactone.  I recommended he reduce his spironolactone to 12.5 mg and further titrated furosemide.  With increased glycerides I recommended Vascepa 2 capsules twice a day.   I saw him  in February 2021.  Over the prior months, he has felt well.  His blood pressure has been stable.  He underwent a follow-up sleep study on May 10, 2019 which was notable for borderline sleep apnea with an AHI of 4.8/h; however, RDI was increased at 33.2/h.  AHI while supine was 6.8/h.  O2 desaturated to 89%.  AHI during REM sleep was 4.4/h.  He has had issues with elevated triglycerides and he was on Vascepa in addition to atorvastatin 40 mg.  We discussed improved sleep hygiene.  I last saw him in September  2021.  Over the past several months, he has continued eating well.  He apparently has only been taking Vascepa 1 g twice a day instead of 2 g twice a day and atorvastatin 20 mg.  Repeat laboratory by Dr. Osborne Casco can 2021 which showed total cholesterol 144, triglycerides elevated at 368.  LDL cholesterol was 35.  Chemistry was normal.  Uric acid was 5.8.  PSA 1.98.  He denies any chest pain or shortness of breath.  When he has to walk long distances out of town he brings with him an Transport planner for improved mobility.  He is sleeping well and did not pursue CPAP.   He was evaluated by Sande Rives in February 2022.  He was not having any anginal symptoms.  He was on low-dose Toprol-XL and has a history of second-degree type I block and at times heart rate had dropped into the 40s.  I last saw him in August 2022 2022 at which time he remained active and was bowling 2 times per week.  At times he has noticed that his heart rate may drop into the upper 40s particularly while sleeping.  He denied any chest pain or  shortness of breath or exertional dyspnea.  He was sleeping well but typically reads late at night and often goes to bed at 1 AM and wakes up around 10 AM.  He continues to be on atorvastatin 20 mg and Vascepa for hyperlipidemia.  He is on olmesartan 20 mg, metoprolol succinate 12.5 mg daily for blood pressure control.  He is diabetic on Jardiance and metformin.  GERD is controlled with Prilosec.  During that evaluation, his blood pressure was stable.  His ECG showed second-degree type I winky block.  I recommended he reduce metoprolol succinate to 12.5 mg every other day.  He had incomplete right bundle branch block, and left anterior hemiblock as well as PVCs.  Since I last saw him, he continues to feel well and denies chest pain or shortness of breath.  He continues to notice his pulse in the upper 40s while sleeping.  Most of the time his heart rate is in the 60 to around 70 range.  He denies any presyncope or syncope.  He brought with him a thorough list of his blood pressures and heart rate which I have reviewed.  He presents for evaluation.   Past medical history: CAD, status post CABG, hypertension, type 2 diabetes mellitus, mixed hyperlipidemia, obstructive sleep apnea, mild. He is status post removal of a a basal cell cancer removed from his left cheek by Dr. Sarajane Jews.    Past surgical history notable for CABG surgery, cataract surgery 2005 prostate surgery 1989.  Current Outpatient Medications  Medication Sig Dispense Refill   acetaminophen (TYLENOL) 650 MG CR tablet Take 1,300 mg by mouth at bedtime.     allopurinol (ZYLOPRIM) 300 MG tablet Take 300 mg by mouth daily.  5   aspirin 81 MG tablet Take 81 mg by mouth every evening.      atorvastatin (LIPITOR) 40 MG tablet TAKE 1/2 (ONE-HALF) TABLET BY MOUTH ONCE DAILY AT  6PM 45 tablet 10   BIOTIN PO Take 1 tablet by mouth daily.     BLACK ELDERBERRY PO Take 50 mg by mouth 3 (three) times a week.     cetirizine (ZYRTEC) 10 MG tablet Take 10  mg by mouth at bedtime.     cetirizine (ZYRTEC) 10 MG tablet as needed.     Cholecalciferol (VITAMIN D3) 50  MCG (2000 UT) capsule Take 2,000 Units by mouth 3 (three) times a week.     colchicine 0.6 MG tablet Take 0.6 mg by mouth daily as needed (gout flares).     Cyanocobalamin (VITAMIN B12 PO) Take 1,000 mg by mouth 3 (three) times a week.     diphenhydrAMINE (BENADRYL) 25 MG tablet 2 tablets     doxycycline (VIBRAMYCIN) 100 MG capsule Take 100 mg by mouth See admin instructions. Bid x 5 days     empagliflozin (JARDIANCE) 25 MG TABS tablet Take 12.5 mg by mouth daily.     fluticasone (FLONASE) 50 MCG/ACT nasal spray Place 1 spray into both nostrils every evening.     fluticasone (FLONASE) 50 MCG/ACT nasal spray as needed.     folic acid (FOLVITE) 161 MCG tablet Take 400 mcg by mouth 3 (three) times a week.     furosemide (LASIX) 40 MG tablet Take 1 tablet by mouth once daily 90 tablet 3   guaiFENesin (MUCINEX) 600 MG 12 hr tablet Take 600 mg by mouth daily.     icosapent Ethyl (VASCEPA) 1 g capsule Take 2 capsules (2 g total) by mouth 2 (two) times daily. 120 capsule 2   Melatonin 10 MG TABS Take 10 mg by mouth at bedtime.     metFORMIN (GLUCOPHAGE) 500 MG tablet Take 1 tablet by mouth 2 (two) times daily.     metoprolol succinate (TOPROL-XL) 25 MG 24 hr tablet Take 12.5 mg by mouth. Take 1 tablet by mouth two days in a row and then 1 day off     montelukast (SINGULAIR) 10 MG tablet Take 10 mg by mouth at bedtime.      montelukast (SINGULAIR) 10 MG tablet Take 1 tablet by mouth daily.     Multiple Vitamins-Minerals (ZINC PO) Take 1 tablet by mouth 3 (three) times a week.     nitroGLYCERIN (NITROSTAT) 0.4 MG SL tablet Place 1 tablet under the tongue daily as needed for chest pain.     olmesartan (BENICAR) 20 MG tablet Take 1 tablet by mouth once daily 90 tablet 1   omeprazole (PRILOSEC) 20 MG capsule Take 20 mg by mouth 2 (two) times daily before a meal.     oxymetazoline (AFRIN) 0.05 % nasal  spray Place 1 spray into both nostrils at bedtime.     testosterone (ANDROGEL) 50 MG/5GM GEL Place 5 g onto the skin 2 (two) times daily.      vitamin E 400 UNIT capsule Take 400 Units by mouth 3 (three) times a week.     No current facility-administered medications for this visit.    He is widowed per history children 4 grandchildren. There is no tobacco use. He does drink occasional alcohol.  ROS General: Negative; No fevers, chills, or night sweats;  HEENT: Positive for cracks in his molars; No changes in vision or hearing, sinus congestion, difficulty swallowing Pulmonary: Negative; No cough, wheezing, shortness of breath, hemoptysis Cardiovascular: Negative; No chest pain, presyncope, syncope, palpitations GI: Negative; No nausea, vomiting, diarrhea, or abdominal pain GU: Negative; No dysuria, hematuria, or difficulty voiding Musculoskeletal: Positive for back pain with leg discomfort secondary to spinal stenosis; intermittent left leg discomfort being evaluated by Dr. Lindwood Qua Rheumatologic: Gout Hematologic/Oncology: Negative; no easy bruising, bleeding Endocrine: Negative; no heat/cold intolerance; no diabetes Neuro: Negative; no changes in balance, headaches Skin: history of removal of prior basal cell carcinoma with clear margins; No rashes or skin lesions Psychiatric: Negative; No behavioral problems, depression Sleep:  mild sleep apnea, not on CPAP therapy; No  daytime sleepiness, hypersomnolence, bruxism, restless legs, hypnogognic hallucinations, no cataplexy Other comprehensive 14 point system review is negative.   PE BP 128/64    Pulse 77    Ht 5' 6" (1.676 m)    Wt 184 lb 12.8 oz (83.8 kg)    SpO2 97%    BMI 29.83 kg/m    Repeat blood pressure by me was 130/66.  Wt Readings from Last 3 Encounters:  08/27/21 184 lb 12.8 oz (83.8 kg)  03/17/21 183 lb (83 kg)  09/18/20 188 lb (85.3 kg)   General: Alert, oriented, no distress.  Skin: normal turgor, no rashes,  warm and dry HEENT: Normocephalic, atraumatic. Pupils equal round and reactive to light; sclera anicteric; extraocular muscles intact;  Nose without nasal septal hypertrophy Mouth/Parynx benign; Mallinpatti scale 3 Neck: No JVD, no carotid bruits; normal carotid upstroke Lungs: clear to ausculatation and percussion; no wheezing or rales Chest wall: without tenderness to palpitation Heart: PMI not displaced, frequent bigeminal PVCs, s1 s2 normal, 1/6 systolic murmur, no diastolic murmur, no rubs, gallops, thrills, or heaves Abdomen: soft, nontender; no hepatosplenomehaly, BS+; abdominal aorta nontender and not dilated by palpation. Back: no CVA tenderness Pulses 2+ Musculoskeletal: full range of motion, normal strength, no joint deformities Extremities: no clubbing cyanosis or edema, Homan's sign negative  Neurologic: grossly nonfocal; Cranial nerves grossly wnl Psychologic: Normal mood and affect    August 27, 2020 ECG (independently read by me): Probable sinus with transient bigeminal PVC, RBBB     March 17, 2021 ECG (independently read by me):  Sinus rhythm, 2nd degree Mobitz I, PVCs, LAHB, IRBBB  September 2021 ECG (independently read by me): Sinus rhythm with PACs, first-degree AV block with PR interval 288 ms.  Left bundle branch block.  February 2021 ECG (independently read by me): Normal sinus rhythm with first-degree AV block and an isolated PVC 2015 checked intermittently at this time.   September 2020 ECG (independently read by me): Sinus rhythm 82 bpm with PACs and blocked PACs.  January 2020 ECG (independently read by me): Sinus rhythm at 79 bpm with first-degree AV block with appeared normal at 248 ms.  Left bundle branch block and left axis deviation.  July 2019 ECG (independently read by me): Sinus rhythm with first-degree AV block, frequent PVCs with transient bigeminy.  QTc interval 451 ms.  PR interval 242 ms.  November 2018 ECG (independently read by me): Difficult  to discern but probable sinus rhythm with PACs  October 2017 ECG (independently read by me): Normal sinus rhythm with first-degree AV block.  PAC.  Left bundle branch block.  July 2017 ECG (independently read by me): Sinus rhythm at 64 bpm.  PACs, nonspecific interventricular block.  First-degree AV block.  April 2017 ECG (independently read by me): Normal sinus rhythm at 64 bpm with occasional PVC and sinus arrhythmia.  First-degree AV block with a PR interval at 278 ms.  Blocked APC.  January 2017 ECG (independently read by me):  Sinus rhythm marked first-degree block.there also was a blocked APC.   He was a suggestion of possible 2nd degree Wenkebach block.  July 2016 ECG (independently read by me): Sinus rhythm with first-degree AV block with a PR interval at 248 ms.  May 2016 ECG (independently read by me): Probable 2nd degree Wenkebach block  November 2015 ECG (independently read by me): normal sinus rhythm with first-degree AV block with a PR interval at 230 milliseconds.  March 2015  ECG: (independently read by me) sinus rhythm at 73 beats per minute. First degree block with PR interval 248 ms. Isolated unifocal PVCs.   LABS:  BMP Latest Ref Rng & Units 09/16/2020 09/16/2020 01/15/2020  Glucose 70 - 99 mg/dL 158(H) 159(H) 119(H)  BUN 8 - 23 mg/dL 24(H) 27(H) 29(H)  Creatinine 0.61 - 1.24 mg/dL 1.77(H) 1.80(H) 1.75(H)  BUN/Creat Ratio 10 - 24 - - 17  Sodium 135 - 145 mmol/L 138 140 142  Potassium 3.5 - 5.1 mmol/L 3.9 3.9 4.1  Chloride 98 - 111 mmol/L 101 102 105  CO2 22 - 32 mmol/L 24 - 18(L)  Calcium 8.9 - 10.3 mg/dL 9.7 - 9.4   Hepatic Function Latest Ref Rng & Units 09/16/2020 11/25/2015 10/28/2013  Total Protein 6.5 - 8.1 g/dL 7.1 6.6 7.5  Albumin 3.5 - 5.0 g/dL 4.0 4.2 3.9  AST 15 - 41 U/L 35 29 35  ALT 0 - 44 U/L 32 26 26  Alk Phosphatase 38 - 126 U/L 69 59 30(L)  Total Bilirubin 0.3 - 1.2 mg/dL 1.0 0.6 0.5   CBC Latest Ref Rng & Units 09/16/2020 09/16/2020 05/26/2018  WBC  4.0 - 10.5 K/uL 11.1(H) - 7.2  Hemoglobin 13.0 - 17.0 g/dL 16.6 18.0(H) 13.9  Hematocrit 39.0 - 52.0 % 52.3(H) 53.0(H) 44.3  Platelets 150 - 400 K/uL 216 - 212   Lab Results  Component Value Date   TSH 3.25 11/25/2015    Lab Results  Component Value Date   HGBA1C 6.8 (H) 11/25/2015    Lipid Panel     Component Value Date/Time   CHOL 101 (L) 11/25/2015 1343   TRIG 170 (H) 11/25/2015 1343   HDL 42 11/25/2015 1343   CHOLHDL 2.4 11/25/2015 1343   VLDL 34 (H) 11/25/2015 1343   LDLCALC 25 11/25/2015 1343     RADIOLOGY: No results found.  IMPRESSION:  1. Hx of CABG   2. Coronary artery disease involving native coronary artery of native heart without angina pectoris   3. Essential hypertension   4. PVC's (premature ventricular contractions)   5. Type 2 diabetes mellitus with complication, without long-term current use of insulin ([place])   6. Mixed hyperlipidemia   7. Hyperlipidemia with target LDL less than 70     ASSESSMENT AND PLAN:  Peter Mullen is an 88 year old gentleman who is status post CABG revascularization surgery in 2001. At that time, a preoperative nuclear perfusion study was done prior to him undergoing potential donor bone marrow transplantation for his brother which was abnormal and led to his catheterization and ultimate detection of significant multivessel CAD. He was asymptomatic without chest pain.  He has a history of hypertension and  has been demonstrated to have transient had second-degree AV block, type I.  He has had issues with leg edema and in the past gout.  Previously had been on spironolactone but this was  discontinued due to breast tenderness.  When I saw him in August 2022 his blood pressure was stable on his regimen of furosemide 40 mg, metoprolol succinate 12.5 mg daily and olmesartan 20 mg.  However at that time ECG showed second-degree type I Wenckebach block and as result I reduced his metoprolol succinate to 12.5 mg every other day.  He remains  stable without chest pain or shortness of breath, presyncope or syncope.  He is unaware of palpitations.  I reviewed his blood pressure log in detail.  His heart rate is increased from previously and on ECG today there  is transient ventricular bigeminal rhythm.  As result I have suggested a very slight additional titration of metoprolol and rather than 12.5 mg every other day he will take 12.5 mg for 2 consecutive days and then not take this on the third day.  He has been successful with weight loss from 2005 down to 184.  He continues to be on atorvastatin 20 mg daily and Vascepa 2 capsules twice a day.  In September 2022 LDL cholesterol was 35 however with his mixed hyperlipidemia triglycerides remained elevated at 323.  I discussed the importance of improved diet. he will be seeing Dr. Osborne Casco in March and repeat laboratory will be obtained.  I will see him in 4 to 6 months for reevaluation or sooner as needed.   Troy Sine, MD, [facility]  09/04/2021 7:23 PM

## 2021-08-27 NOTE — Patient Instructions (Signed)
Medication Instructions:  INCREASE metoprolol succinate to 2 days in a row then 1 day off  Monitor BP and heart rate -- if heart rate is too slow, contact office as frequency may need to be reduced again  *If you need a refill on your cardiac medications before your next appointment, please call your pharmacy*   Follow-Up: At [facility], you and your health needs are our priority.  As part of our continuing mission to provide you with exceptional heart care, we have created designated Provider Care Teams.  These Care Teams include your primary Cardiologist (physician) and Advanced Practice Providers (APPs -  Physician Assistants and Nurse Practitioners) who all work together to provide you with the care you need, when you need it.  We recommend signing up for the patient portal called "MyChart".  Sign up information is provided on this After Visit Summary.  MyChart is used to connect with patients for Virtual Visits (Telemedicine).  Patients are able to view lab/test results, encounter notes, upcoming appointments, etc.  Non-urgent messages can be sent to your provider as well.   To learn more about what you can do with MyChart, go to [name].    Your next appointment:    3-4 months with Dr. Claiborne Billings

## 2021-09-04 ENCOUNTER — Encounter: Payer: Self-pay | Admitting: Cardiovascular Disease

## 2021-09-26 ENCOUNTER — Other Ambulatory Visit: Payer: Self-pay | Admitting: Cardiovascular Disease

## 2021-09-30 ENCOUNTER — Telehealth: Payer: Self-pay | Admitting: Cardiovascular Disease

## 2021-09-30 NOTE — Telephone Encounter (Signed)
Patient of Dr. Claiborne Billings walked in to office with complaints of elevated BP and low HR ?BP 160/86 and pulse 37 ? ?Per review of last MD note: ?As result I have suggested a very slight additional titration of metoprolol and rather than 12.5 mg every other day he will take 12.5 mg for 2 consecutive days and then not take this on the third day ? ?Instructions provided to patient on AVS: ?INCREASE metoprolol succinate to 2 days in a row then 1 day off ?  ?Monitor BP and heart rate -- if heart rate is too slow, contact office as frequency may need to be reduced again  ?

## 2021-09-30 NOTE — Telephone Encounter (Signed)
Spoke with patient of Dr. Claiborne Billings.  ?He was last seen in Feb 2023 ?H/O CABG, second-degree AV block, type I, HTN ? ?He reports for about 1.5 - 2 weeks that he has noticed elevated BP. He checks around 8-10am, sometimes before and sometimes after meds. He reports low HR also - had a warning on his AppleWatch for HR in 30s-40s from today around 6:30-6:50am, which he reports he was asleep. His HR on AppleWatch was in 40s-50s while we were conversing. He does not know if HR increases with exertion. He went bowling today and is able to do so without difficulty. He feels "reasonably well". He did reports some left shoulder pain yesterday - has h/o CABG but also fell while he was in the office for last visit and injured that same shoulder/rotator cuff. He denies shortness of breath, chest pain, feelings of syncope.  ? ?Per review of chart, patient was to notify office if HR was too low. He said he has difficulties getting thru phone lines. He has NOT decreased metoprolol back to prior frequency.  ? ?He has been scheduled for acute DOD appt on 10/01/21 @ 4:30pm with Dr. Claiborne Billings. He was asked to bring a log of his BP readings from home.  ?

## 2021-10-01 ENCOUNTER — Other Ambulatory Visit: Payer: Self-pay | Admitting: Cardiovascular Disease

## 2021-10-01 ENCOUNTER — Ambulatory Visit: Payer: Medicare Other | Admitting: Cardiovascular Disease

## 2021-10-01 ENCOUNTER — Encounter: Payer: Self-pay | Admitting: Cardiovascular Disease

## 2021-10-01 ENCOUNTER — Other Ambulatory Visit: Payer: Self-pay

## 2021-10-01 DIAGNOSIS — E782 Mixed hyperlipidemia: Secondary | ICD-10-CM

## 2021-10-01 DIAGNOSIS — I493 Ventricular premature depolarization: Secondary | ICD-10-CM

## 2021-10-01 DIAGNOSIS — I2510 Atherosclerotic heart disease of native coronary artery without angina pectoris: Secondary | ICD-10-CM

## 2021-10-01 DIAGNOSIS — Z951 Presence of aortocoronary bypass graft: Secondary | ICD-10-CM

## 2021-10-01 DIAGNOSIS — I10 Essential (primary) hypertension: Secondary | ICD-10-CM

## 2021-10-01 DIAGNOSIS — I441 Atrioventricular block, second degree: Secondary | ICD-10-CM

## 2021-10-01 DIAGNOSIS — E118 Type 2 diabetes mellitus with unspecified complications: Secondary | ICD-10-CM

## 2021-10-01 NOTE — Patient Instructions (Signed)
Medication Instructions:  ?The current medical regimen is effective;  continue present plan and medications as directed. Please refer to the Current Medication list given to you today.  ? ?*If you need a refill on your cardiac medications before your next appointment, please call your pharmacy* ? ?Lab Work:   Testing/Procedures:  ?NONE    NONE ? ?Follow-Up: ?Your next appointment:  6-7 month(s) In Person with Shelva Majestic, MD  ? ?Please call our office 2 months in advance to schedule this appointment  ? ?At [facility], you and your health needs are our priority.  As part of our continuing mission to provide you with exceptional heart care, we have created designated Provider Care Teams.  These Care Teams include your primary Cardiologist (physician) and Advanced Practice Providers (APPs -  Physician Assistants and Nurse Practitioners) who all work together to provide you with the care you need, when you need it. ? ? ?

## 2021-10-01 NOTE — Progress Notes (Signed)
Patient ID: Peter Mullen, male   DOB: [birth year], 88 y.o.   MRN: [id number]    Primary M.D.: Dr. Delfino Lovett Mullen  HPI: Peter Mullen is a 88 y.o. male who presents to the office today for a 5 week follow-up cardiology evaluation.    [name] has established CAD and in March 2001 underwent CABG revascularization surgery with a LIMA to the LAD, SVG to diagonal, SVG to the PLA, and SVG to the PDA vessel. Additional medical problems include hypertension, type 2 diabetes mellitus, mild obstructive sleep apnea not on CPAP therapy and obesity.   In October 2012 an nuclear perfusion study showed normal perfusion with a post stress ejection fraction of 62%. He has documented grade 1 diastolic dysfunction EA ratio 0.71.   A 2-D echo irevealed mild aortic sclerosis without stenosis, trace MR and trace TR. He has a history of mixed hyperlipidemia. He sees Dr. Vianne Mullen for his primary medical care. Laboratory showed a normal chemistry profile, cholesterol 93, triglycerides, 110 HDL 31, and LDL 40 on his current medical regimen.  A two-year followup nuclear perfusion study on 06/30/2013 was low risk and demonstrated apical thinning with increased attenuation on resting images without significant ischemia. He did have mild LV dysfunction with an ejection fraction of 47% and focal apical inferior hypocontractility.    On 09/20/2013 a follow-up echo Doppler study showed an ejection fraction of 55% without diagnostic regional wall motion abnormalities although septal bounce was noted. He had grade 1 diastolic dysfunction. He had moderate calcification of his aortic valve without stenosis and there was moderate calcification of the mitral annulus with mildly calcified leaflets without significant regurgitation. His left atrium was mildly dilated. He had normal pulmonary pressures.  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  At that Mullen, he had been on Toprol 100 mg daily and I  reduced this to 50 mg per day.  He has continued to take losartan 100 mg, HCTZ in addition to terazocin for hypertension.   In 2017 he had broken 2 of his molars and has been evaluated at the dental clinic at [facility].  He has been on chronic Plavix.  He had noticed that his blood pressure has been consistently elevated despite taking his vacations which include HCTZ 25 mg, losartan 100 mg, Toprol-XL 25 mg, and Hytrin 5 mg daily.  I added amlodipine 5 mg to his medical regimen for improved blood pressure control.  He had held his Plavix for dental work on his molars.   He is remaining active and typically bowls 2 times per week.  He has been having occasional pain down his left leg which may be attributable to spinal stenosis and in the past he had seen [facility] Mullen for this, which had limited his walking.  He will need to have a molar extraction as well as an impacted was some tooth removed at the [facility] clinic, which is scheduled for later this month.  He underwent a nuclear perfusion study on 11/03/2016 in follow-up of his remote CABG surgery and this continued to be low risk and showed only a very small prior basal/mid inferoseptal defect without associated ischemia.  Ejection fraction was 55% and he had normal wall motion.    He underwent a nuclear perfusion study in April 2018, which remained low risk and showed a small defect in the basal inferoseptal and mid inferoseptal region consistent with probable scar without associated ischemia.  EF was 55% with normal wall motion.  Laboratory in June 2018 which showed total cholesterol 122, HDL 38, LDL 39, but triglycerides were elevated at 224.  Hemoglobin A1c was 6.2.  TSH was 3.25.     When I saw him in July 2019 he remained stable and was without chest pain.  He  admitted to some exertional fatigue.  He was  found to have an elevated uric acid level and was just started on allopurinol.  He has been evaluated by [facility] Mullen for left leg discomfort with some  left ankle swelling.  He denies any increasing shortness of breath or significant palpitations.    Since his prior evaluation he has continued to do well.  He still experiences occasional leg pain but this has improved.  He had undergone lower extremity Doppler assessment on May 06, 2018 which showed noncompressible ABIs bilaterally but he had normal toe brachial index bilaterally.  He has not had any acute gout flares.  He had recently fallen while bowling and banged his head.  He was hospitalized overnight and was found to have a small subdural hematoma.  He was assessed by Peter Mullen at that [facility]ullen recommended holding Plavix for at least 2 weeks and okay to continue aspirin.  He has been off Plavix since his head trauma May 26, 2018.    I saw him in the office in January 2020 for follow-up Cardiologic evaluation.  At that [facility]ullen he was feeling well and was without chest pain he denied PND orthopnea.  He was sleeping well.  I recommended discontinuance of hydrochlorothiazide due to potential increased uric acid risk and suggested slight titration of spironolactone up to 25 mg daily.  He was evaluated in a telemedicine visit in May 2020.  At that [facility]ullen he was feeling well but continued to have issues with leg swelling.  He had noticed some improvement in his leg swelling with the increase in spironolactone dose but every day by midday he had developed recurrent edema.  He was using compression stockings.  He was bradycardic and his Toprol dose was reduced to 12.5 mg because of continued bilateral leg edema furosemide 20 mg was added to his regimen.  I saw him in September 2020 at which [facility] he was continuing to feel well and denied any anginal symptomatology.  He recently  noticed some mild tenderness in the breast region which had occurred on his increase spironolactone dosing.  He had been taking Spironolactone 25 mg twice a day.  Otherwise, he feels well.  He has had frequent awakenings and has not  been sleeping as well.  Remotely he had decided against pursuing CPAP therapy with remotely diagnosed sleep apnea well.    He developed some mild breast tenderness most likely resulting from spironolactone.  I recommended he reduce his spironolactone to 12.5 mg and further titrated furosemide.  With increased glycerides I recommended Vascepa 2 capsules twice a day.   I saw him  in February 2021.  Over the prior months, he has felt well.  His blood pressure has been stable.  He underwent a follow-up sleep study on May 10, 2019 which was notable for borderline sleep apnea with an AHI of 4.8/h; however, RDI was increased at 33.2/h.  AHI while supine was 6.8/h.  O2 desaturated to 89%.  AHI during REM sleep was 4.4/h.  He has had issues with elevated triglycerides and he was on Vascepa in addition to atorvastatin 40 mg.  We discussed improved sleep hygiene.  I saw him in September 2021.  Over the past several months, he has continued eating well.  He apparently has only been taking Vascepa 1 g twice a day instead of 2 g twice a day and atorvastatin 20 mg.  Repeat laboratory by Dr. Osborne Casco can 2021 which showed total cholesterol 144, triglycerides elevated at 368.  LDL cholesterol was 35.  Chemistry was normal.  Uric acid was 5.8.  PSA 1.98.  He denies any chest pain or shortness of breath.  When he has to walk long distances out of town he brings with him an Transport planner for improved mobility.  He is sleeping well and did not pursue CPAP.   He was evaluated by Sande Rives in February 2022.  He was not having any anginal symptoms.  He was on low-dose Toprol-XL and has a history of second-degree type I block and at times heart rate had dropped into the 40s.  When I saw him in August 2022 2022 he remained active and was bowling 2 times per week.  At times he has noticed that his heart rate may drop into the upper 40s particularly while sleeping.  He denied any chest pain or shortness of breath or  exertional dyspnea.  He was sleeping well but typically reads late at night and often goes to bed at 1 AM and wakes up around 10 AM.  He continues to be on atorvastatin 20 mg and Vascepa for hyperlipidemia.  He is on olmesartan 20 mg, metoprolol succinate 12.5 mg daily for blood pressure control.  He is diabetic on Jardiance and metformin.  GERD is controlled with Prilosec.  During that evaluation, his blood pressure was stable.  His ECG showed second-degree type I wenkeblock.  I recommended he reduce metoprolol succinate to 12.5 mg every other day.  He had incomplete right bundle branch block, and left anterior hemiblock as well as PVCs.  I last saw him on August 27, 2021.  Since his prior evaluation he continued to feel well and denied any chest pain, presyncope or syncope or shortness of breath.  He brought with him a list of blood pressure recordings which I reviewed.  He notices his pulse in the upper 40s while sleeping but most of his heart rate is in the 60-70 range.  During that evaluation, his heart rate had increased from previously and on his ECG there was transient ventricular bigeminal rhythm.  As result I suggested a slight additional titration of metoprolol and rather take 12.5 mg every other day he will take 12.5 mg for 2 consecutive days and then not take this on the third day.  He has continued to be successful with weight loss down to 184 pounds.  Presently, he is feeling improved following his medication adjustment with metoprolol tartrate.  At times blood pressure is elevated but most of the Mullen is stable.  He continues to bowl 2 times per week.  He remains active.  He denies chest pain, exertional dyspnea, and is unaware of any lightheadedness.  He presents for reevaluation.  Past medical history: CAD, status post CABG, hypertension, type 2 diabetes mellitus, mixed hyperlipidemia, obstructive sleep apnea, mild. He is status post removal of a a basal cell cancer removed from his left  cheek by Dr. Sarajane Jews.    Past surgical history notable for CABG surgery, cataract surgery 2005 prostate surgery 1989.  Current Outpatient Medications  Medication Sig Dispense Refill   acetaminophen (TYLENOL) 650 MG CR tablet Take 1,300 mg by mouth at bedtime.     allopurinol (  ZYLOPRIM) 300 MG tablet Take 300 mg by mouth daily.  5   aspirin 81 MG tablet Take 81 mg by mouth every evening.      atorvastatin (LIPITOR) 40 MG tablet TAKE 1/2 (ONE-HALF) TABLET BY MOUTH ONCE DAILY AT  6PM 45 tablet 2   BIOTIN PO Take 1 tablet by mouth daily.     BLACK ELDERBERRY PO Take 50 mg by mouth 3 (three) times a week.     cetirizine (ZYRTEC) 10 MG tablet Take 10 mg by mouth at bedtime.     cetirizine (ZYRTEC) 10 MG tablet as needed.     Cholecalciferol (VITAMIN D3) 50 MCG (2000 UT) capsule Take 2,000 Units by mouth 3 (three) times a week.     colchicine 0.6 MG tablet Take 0.6 mg by mouth daily as needed (gout flares).     Cyanocobalamin (VITAMIN B12 PO) Take 1,000 mg by mouth 3 (three) times a week.     diphenhydrAMINE (BENADRYL) 25 MG tablet 2 tablets     doxycycline (VIBRAMYCIN) 100 MG capsule Take 100 mg by mouth See admin instructions. Bid x 5 days     empagliflozin (JARDIANCE) 25 MG TABS tablet Take 12.5 mg by mouth daily.     fluticasone (FLONASE) 50 MCG/ACT nasal spray Place 1 spray into both nostrils every evening.     fluticasone (FLONASE) 50 MCG/ACT nasal spray as needed.     folic acid (FOLVITE) 026 MCG tablet Take 400 mcg by mouth 3 (three) times a week.     furosemide (LASIX) 40 MG tablet Take 1 tablet by mouth once daily 90 tablet 3   guaiFENesin (MUCINEX) 600 MG 12 hr tablet Take 600 mg by mouth daily.     icosapent Ethyl (VASCEPA) 1 g capsule Take 2 capsules (2 g total) by mouth 2 (two) times daily. 120 capsule 2   Melatonin 10 MG TABS Take 10 mg by mouth at bedtime.     metFORMIN (GLUCOPHAGE) 500 MG tablet Take 1 tablet by mouth 2 (two) times daily.     metoprolol succinate (TOPROL-XL) 25  MG 24 hr tablet Take 1/2 (one-half) tablet by mouth once daily 60 tablet 2   montelukast (SINGULAIR) 10 MG tablet Take 10 mg by mouth at bedtime.      montelukast (SINGULAIR) 10 MG tablet Take 1 tablet by mouth daily.     Multiple Vitamins-Minerals (ZINC PO) Take 1 tablet by mouth 3 (three) times a week.     nitroGLYCERIN (NITROSTAT) 0.4 MG SL tablet Place 1 tablet under the tongue daily as needed for chest pain.     olmesartan (BENICAR) 20 MG tablet Take 1 tablet by mouth once daily 90 tablet 3   omeprazole (PRILOSEC) 20 MG capsule Take 20 mg by mouth 2 (two) times daily before a meal.     oxymetazoline (AFRIN) 0.05 % nasal spray Place 1 spray into both nostrils at bedtime.     testosterone (ANDROGEL) 50 MG/5GM GEL Place 5 g onto the skin 2 (two) times daily.      vitamin E 400 UNIT capsule Take 400 Units by mouth 3 (three) times a week.     No current facility-administered medications for this visit.    He is widowed per history children 4 grandchildren. There is no tobacco use. He does drink occasional alcohol.  ROS General: Negative; No fevers, chills, or night sweats;  HEENT: Positive for cracks in his molars; No changes in vision or hearing, sinus congestion, difficulty swallowing Pulmonary: Negative;  No cough, wheezing, shortness of breath, hemoptysis Cardiovascular: Negative; No chest pain, presyncope, syncope, palpitations GI: Negative; No nausea, vomiting, diarrhea, or abdominal pain GU: Negative; No dysuria, hematuria, or difficulty voiding Musculoskeletal: Positive for back pain with leg discomfort secondary to spinal stenosis; intermittent left leg discomfort being evaluated by Dr. Lindwood Qua; left shoulder discomfort from his prior fall Rheumatologic: Gout Hematologic/Oncology: Negative; no easy bruising, bleeding Endocrine: Negative; no heat/cold intolerance; no diabetes Neuro: Negative; no changes in balance, headaches Skin: history of removal of prior basal cell  carcinoma with clear margins; No rashes or skin lesions Psychiatric: Negative; No behavioral problems, depression Sleep: mild sleep apnea, not on CPAP therapy; No  daytime sleepiness, hypersomnolence, bruxism, restless legs, hypnogognic hallucinations, no cataplexy Other comprehensive 14 point system review is negative.   PE BP (!) 152/60    Pulse 69    Ht '5\' 6"'  (1.676 m)    Wt 181 lb 12.8 oz (82.5 kg)    SpO2 97%    BMI 29.34 kg/m    Repeat blood pressure by me was 134/64  Wt Readings from Last 3 Encounters:  10/01/21 181 lb 12.8 oz (82.5 kg)  08/27/21 184 lb 12.8 oz (83.8 kg)  03/17/21 183 lb (83 kg)   General: Alert, oriented, no distress.  Skin: normal turgor, no rashes, warm and dry HEENT: Normocephalic, atraumatic. Pupils equal round and reactive to light; sclera anicteric; extraocular muscles intact; Nose without nasal septal hypertrophy Mouth/Parynx benign; Mallinpatti scale 3 Neck: No JVD, no carotid bruits; normal carotid upstroke Lungs: clear to ausculatation and percussion; no wheezing or rales Chest wall: without tenderness to palpitation Heart: PMI not displaced, RRR, rare extra beat; s1 s2 normal, 1/6 systolic murmur, no diastolic murmur, no rubs, gallops, thrills, or heaves Abdomen: soft, nontender; no hepatosplenomehaly, BS+; abdominal aorta nontender and not dilated by palpation. Back: no CVA tenderness Pulses 2+ Musculoskeletal: full range of motion, normal strength, no joint deformities Extremities: no clubbing cyanosis or edema, Homan's sign negative  Neurologic: grossly nonfocal; Cranial nerves grossly wnl Psychologic: Normal mood and affect   ECG (independently read by me):  Sinus rhythm with PVC, and transient Mobitz I block   August 27, 2021 ECG (independently read by me): Probable sinus with transient bigeminal PVC, RBBB     March 17, 2021 ECG (independently read by me):  Sinus rhythm, 2nd degree Mobitz I, PVCs, LAHB, IRBBB  September 2021 ECG  (independently read by me): Sinus rhythm with PACs, first-degree AV block with PR interval 288 ms.  Left bundle branch block.  February 2021 ECG (independently read by me): Normal sinus rhythm with first-degree AV block and an isolated PVC 2015 checked intermittently at this [facility].   September 2020 ECG (independently read by me): Sinus rhythm 82 bpm with PACs and blocked PACs.  January 2020 ECG (independently read by me): Sinus rhythm at 79 bpm with first-degree AV block with appeared normal at 248 ms.  Left bundle branch block and left axis deviation.  July 2019 ECG (independently read by me): Sinus rhythm with first-degree AV block, frequent PVCs with transient bigeminy.  QTc interval 451 ms.  PR interval 242 ms.  November 2018 ECG (independently read by me): Difficult to discern but probable sinus rhythm with PACs  October 2017 ECG (independently read by me): Normal sinus rhythm with first-degree AV block.  PAC.  Left bundle branch block.  July 2017 ECG (independently read by me): Sinus rhythm at 64 bpm.  PACs, nonspecific interventricular block.  First-degree  AV block.  April 2017 ECG (independently read by me): Normal sinus rhythm at 64 bpm with occasional PVC and sinus arrhythmia.  First-degree AV block with a PR interval at 278 ms.  Blocked APC.  January 2017 ECG (independently read by me):  Sinus rhythm marked first-degree block.there also was a blocked APC.   He was a suggestion of possible 2nd degree Wenkebach block.  July 2016 ECG (independently read by me): Sinus rhythm with first-degree AV block with a PR interval at 248 ms.  May 2016 ECG (independently read by me): Probable 2nd degree Wenkebach block  November 2015 ECG (independently read by me): normal sinus rhythm with first-degree AV block with a PR interval at 230 milliseconds.  March 2015 ECG: (independently read by me) sinus rhythm at 73 beats per minute. First degree block with PR interval 248 ms. Isolated unifocal  PVCs.   LABS:  BMP Latest Ref Rng & Units 09/16/2020 09/16/2020 01/15/2020  Glucose 70 - 99 mg/dL 158(H) 159(H) 119(H)  BUN 8 - 23 mg/dL 24(H) 27(H) 29(H)  Creatinine 0.61 - 1.24 mg/dL 1.77(H) 1.80(H) 1.75(H)  BUN/Creat Ratio 10 - 24 - - 17  Sodium 135 - 145 mmol/L 138 140 142  Potassium 3.5 - 5.1 mmol/L 3.9 3.9 4.1  Chloride 98 - 111 mmol/L 101 102 105  CO2 22 - 32 mmol/L 24 - 18(L)  Calcium 8.9 - 10.3 mg/dL 9.7 - 9.4   Hepatic Function Latest Ref Rng & Units 09/16/2020 11/25/2015 10/28/2013  Total Protein 6.5 - 8.1 g/dL 7.1 6.6 7.5  Albumin 3.5 - 5.0 g/dL 4.0 4.2 3.9  AST 15 - 41 U/L 35 29 35  ALT 0 - 44 U/L 32 26 26  Alk Phosphatase 38 - 126 U/L 69 59 30(L)  Total Bilirubin 0.3 - 1.2 mg/dL 1.0 0.6 0.5   CBC Latest Ref Rng & Units 09/16/2020 09/16/2020 05/26/2018  WBC 4.0 - 10.5 K/uL 11.1(H) - 7.2  Hemoglobin 13.0 - 17.0 g/dL 16.6 18.0(H) 13.9  Hematocrit 39.0 - 52.0 % 52.3(H) 53.0(H) 44.3  Platelets 150 - 400 K/uL 216 - 212   Lab Results  Component Value Date   TSH 3.25 11/25/2015    Lab Results  Component Value Date   HGBA1C 6.8 (H) 11/25/2015    Lipid Panel     Component Value Date/[facility]   CHOL 101 (L) 11/25/2015 1343   TRIG 170 (H) 11/25/2015 1343   HDL 42 11/25/2015 1343   CHOLHDL 2.4 11/25/2015 1343   VLDL 34 (H) 11/25/2015 1343   LDLCALC 25 11/25/2015 1343     RADIOLOGY: No results found.  IMPRESSION:  1. Essential hypertension   2. Coronary artery disease involving native coronary artery of native heart without angina pectoris   3. Hx of CABG   4. PVC's (premature ventricular contractions)   5. Mixed hyperlipidemia   6. Type 2 diabetes mellitus with complication, without long-term current use of insulin (HCC)   7. Second degree AV block, Mobitz type I     ASSESSMENT AND PLAN:  [name] is an 88 year old gentleman who is status post CABG revascularization surgery in 2001. At that [facility], a preoperative nuclear perfusion study was done prior to him  undergoing potential donor bone marrow transplantation for his brother which was abnormal and led to his catheterization and ultimate detection of significant multivessel CAD. He was asymptomatic without chest pain.  He has a history of hypertension and  has been demonstrated to have transient had second-degree AV block, type  I.  He has had issues with leg edema and in the past gout.  Previously had been on spironolactone but this was  discontinued due to breast tenderness.  When I saw him in August 2022 his blood pressure was stable on his regimen of furosemide 40 mg, metoprolol succinate 12.5 mg daily and olmesartan 20 mg.  However at that [facility] ECG showed second-degree type I Wenckebach block and as result I reduced his metoprolol succinate to 12.5 mg every other day.  When seen on August 27, 2021 his ECG showed a bigeminal rhythm.  At that [facility] slight additional titration of metoprolol was recommended to take 12.5 mg for 2 consecutive days and then skip a dose on the third day.  He has felt improved on this therapy.  At times there is some blood pressure lability.  Repeat blood pressure by me was 134/64.  He continues to be on olmesartan 20 mg, furosemide 40 mg in addition to his metoprolol succinate.  He remains active and bowls at least 2 times per week and denies chest pain.  His ECG did show rare PVC and there was a transient suggestion of Wenkeblock.  He remains asymptomatic.  He continues to have purposeful weight loss and his weight today is further decreased to 181.  He continues to be on atorvastatin 20 mg and Vascepa 2 capsules twice a day for mixed hyperlipidemia.  He denies any chest pain or anginal symptoms.  He is diabetic on metformin and Jardiance.  He has stage IIIb CKD with most recent creatinine at 1.77 in February 2022.  He will be following up with Dr. Osborne Casco later this month.  I will see him in in 6 months for follow-up evaluation or sooner as needed.  Troy Sine, MD, [facility]   10/03/2021 12:04 PM

## 2021-10-03 ENCOUNTER — Encounter: Payer: Self-pay | Admitting: Cardiovascular Disease

## 2021-12-17 ENCOUNTER — Ambulatory Visit: Payer: Medicare Other | Admitting: Cardiovascular Disease

## 2021-12-30 ENCOUNTER — Telehealth: Payer: Self-pay

## 2021-12-30 NOTE — Telephone Encounter (Signed)
Mr.Westberg came into office to discuss his blood pressure then began telling front desk staff that he was having some chest discomfort only occurring in the evening. He states it is just a dull pain but would like to make sure he is okay. He denies swelling or SOB, no other complaints. He does have some complaints with his blood pressure and would like to have this reviewed as well. I did review the upcoming schedule with him- placed him on NP schedule for Friday. Patient verbalized understanding, thankful for the care shown to him in office.  He was made aware of when to contact 911 for emergency. He verbalized understanding of this.   Thanks!

## 2021-12-31 NOTE — Progress Notes (Signed)
Cardiology Clinic Note   Patient Name: Peter Mullen Date of Encounter: 01/02/2022  Primary Care Provider:  Haywood Pao, MD Primary Cardiologist:  Shelva Majestic, MD  Patient Profile    88 year old male patient with known history of coronary artery disease status post CABG in 2001, hypertension, history of transient second-degree AV block type I, chronic leg edema, hyperlipidemia, with other history to include type 2 diabetes on metformin and Jardiance, chronic kidney disease stage IIIb in the setting of complication for his resulting from diabetes.  Past Medical History    Past Medical History:  Diagnosis Date   Coronary artery disease    Diabetes mellitus without complication ([place])    Hyperlipidemia    Hypertension    Skin cancer    Stomach problems    Past Surgical History:  Procedure Laterality Date   CORONARY ARTERY BYPASS GRAFT  2001   x4 LIMA-LAD, SVG-PDA   PROSTATE SURGERY  1992   TONSILLECTOMY  1940   TUMOR REMOVAL  1970   fatty tissue tumor   VASECTOMY  1975    Allergies  Allergies  Allergen Reactions   Codeine Other (See Comments)    Unspecified    Cyclobenzaprine     Other reaction(s): palpitations   Nsaids Other (See Comments)    Unspecified     History of Present Illness    Peter Mullen presents today for ongoing assessment and management of hypertension, CAD with history of CABG, hyperlipidemia, and transient second-degree heart block.  Other history includes TIA, ocular.   He  presented to our office in person with complaints of chest discomfort occurring in the evening described as a dull pain along with the need to have his blood pressure evaluated and managed better.  He was added to my schedule today to assess his current status.  At the time he denied any shortness of breath, lower extremity edema, or significant fatigue.  He also complains of left shoulder pain, numbness and tingling in his right forearm and hand.  He states he was  told by orthopedics that he does have a rotator cuff injury.  There is consideration for surgical repair although the date has not been planned or determined necessary at this time.  He brings with him pages of blood pressure recordings from April through June 2023 along with a diary of his symptoms for each day beginning December 25, 2021.  Main focus of narrative is about shoulder pain and left upper chest discomfort.  Also tingling in his hands.  There is no significant mention of substernal chest pain, shortness of breath or dizziness.  And he denies the same in person.  Blood pressure readings have shown elevation in blood pressure first thing in the morning prior to taking his medications with blood pressure maximum 159/125 with the lowest blood pressure recording of 115/75.  This is usually in the afternoon.  Late evening blood pressure again rises to 153/83 and 149/95 on average.  He states he notices most of his discomfort in his left upper chest and shoulder in the evening.  Home Medications    Current Outpatient Medications  Medication Sig Dispense Refill   acetaminophen (TYLENOL) 650 MG CR tablet Take 1,300 mg by mouth at bedtime.     allopurinol (ZYLOPRIM) 300 MG tablet Take 300 mg by mouth daily.  5   aspirin 81 MG tablet Take 81 mg by mouth every evening.      atorvastatin (LIPITOR) 40 MG tablet TAKE 1/2 (ONE-HALF) TABLET  BY MOUTH ONCE DAILY AT  6PM 45 tablet 2   BIOTIN PO Take 1 tablet by mouth daily.     BLACK ELDERBERRY PO Take 50 mg by mouth 3 (three) times a week.     cetirizine (ZYRTEC) 10 MG tablet Take 10 mg by mouth at bedtime.     cetirizine (ZYRTEC) 10 MG tablet as needed.     Cholecalciferol (VITAMIN D3) 50 MCG (2000 UT) capsule Take 2,000 Units by mouth 3 (three) times a week.     colchicine 0.6 MG tablet Take 0.6 mg by mouth daily as needed (gout flares).     Cyanocobalamin (VITAMIN B12 PO) Take 1,000 mg by mouth 3 (three) times a week.     diphenhydrAMINE (BENADRYL) 25  MG tablet 2 tablets     empagliflozin (JARDIANCE) 25 MG TABS tablet Take 12.5 mg by mouth daily.     fluticasone (FLONASE) 50 MCG/ACT nasal spray Place 1 spray into both nostrils every evening.     folic acid (FOLVITE) 502 MCG tablet Take 400 mcg by mouth 3 (three) times a week.     furosemide (LASIX) 40 MG tablet Take 1 tablet by mouth once daily 90 tablet 3   guaiFENesin (MUCINEX) 600 MG 12 hr tablet Take 600 mg by mouth daily.     icosapent Ethyl (VASCEPA) 1 g capsule Take 2 capsules (2 g total) by mouth 2 (two) times daily. 120 capsule 2   Melatonin 10 MG TABS Take 10 mg by mouth at bedtime.     metFORMIN (GLUCOPHAGE) 500 MG tablet Take 1 tablet by mouth 2 (two) times daily.     metoprolol succinate (TOPROL-XL) 25 MG 24 hr tablet Take 1/2 (one-half) tablet by mouth once daily 60 tablet 2   montelukast (SINGULAIR) 10 MG tablet Take 10 mg by mouth at bedtime.      montelukast (SINGULAIR) 10 MG tablet Take 1 tablet by mouth daily.     Multiple Vitamins-Minerals (ZINC PO) Take 1 tablet by mouth 3 (three) times a week.     nitroGLYCERIN (NITROSTAT) 0.4 MG SL tablet Place 1 tablet under the tongue daily as needed for chest pain.     olmesartan (BENICAR) 20 MG tablet Take 1 tablet by mouth once daily 90 tablet 3   omeprazole (PRILOSEC) 20 MG capsule Take 20 mg by mouth 2 (two) times daily before a meal.     oxymetazoline (AFRIN) 0.05 % nasal spray Place 1 spray into both nostrils at bedtime.     testosterone (ANDROGEL) 50 MG/5GM GEL Place 5 g onto the skin 2 (two) times daily.      vitamin E 400 UNIT capsule Take 400 Units by mouth 3 (three) times a week.     No current facility-administered medications for this visit.     Family History    Family History  Problem Relation Age of Onset   Diabetes Mother    Heart disease Mother    Stroke Mother    Lung cancer Father    Heart disease Maternal Grandfather    Leukemia Brother    Stroke Paternal Grandfather    He indicated that his mother  is deceased. He indicated that his father is deceased. He indicated that the status of his brother is unknown. He indicated that his maternal grandfather is deceased. He indicated that the status of his paternal grandfather is unknown.  Social History    Social History   Socioeconomic History   Marital status: Married  Spouse name: Not on file   Number of children: Not on file   Years of education: Not on file   Highest education level: Not on file  Occupational History   Not on file  Tobacco Use   Smoking status: Never   Smokeless tobacco: Never  Substance and Sexual Activity   Alcohol use: Yes    Alcohol/week: 2.0 standard drinks of alcohol    Types: 2 Standard drinks or equivalent per week   Drug use: Not on file   Sexual activity: Not on file  Other Topics Concern   Not on file  Social History Narrative   Not on file   Social Determinants of Health   Financial Resource Strain: Not on file  Food Insecurity: Not on file  Transportation Needs: Not on file  Physical Activity: Not on file  Stress: Not on file  Social Connections: Not on file  Intimate Partner Violence: Not on file     Review of Systems    General:  No chills, fever, night sweats or weight changes.  Cardiovascular:  No chest pain, dyspnea on exertion, edema, orthopnea, palpitations, paroxysmal nocturnal dyspnea. Dermatological: No rash, lesions/masses Respiratory: No cough, dyspnea Urologic: No hematuria, dysuria Abdominal:   No nausea, vomiting, diarrhea, bright red blood per rectum, melena, or hematemesis Neurologic:  No visual changes, wkns, changes in mental status. All other systems reviewed and are otherwise negative except as noted above.     Physical Exam    VS:  BP 140/70   Pulse (!) 56   Ht 5' 6.5" (1.689 m)   Wt 180 lb (81.6 kg)   SpO2 93%   BMI 28.62 kg/m  , BMI Body mass index is 28.62 kg/m.     GEN: Well nourished, well developed, in no acute distress. HEENT:  normal. Neck: Supple, no JVD, carotid bruits, or masses. Cardiac: RRR, soft systolic murmur murmurs, rubs, or gallops. No clubbing, cyanosis, edema.  Radials/DP/PT 2+ and equal bilaterally.  Respiratory:  Respirations regular and unlabored, clear to auscultation bilaterally. GI: Soft, nontender, nondistended, BS + x 4.  Obese MS: no deformity or atrophy.  Painful range of motion of the left  shoulder and reproducible pain in the left pectoral area with palpation. Skin: warm and dry, no rash. Neuro:  Strength and sensation are intact. Psych: Normal affect.  Accessory Clinical Findings     Lab Results  Component Value Date   WBC 11.1 (H) 09/16/2020   HGB 16.6 09/16/2020   HCT 52.3 (H) 09/16/2020   MCV 93.7 09/16/2020   PLT 216 09/16/2020   Lab Results  Component Value Date   CREATININE 1.77 (H) 09/16/2020   BUN 24 (H) 09/16/2020   NA 138 09/16/2020   K 3.9 09/16/2020   CL 101 09/16/2020   CO2 24 09/16/2020   Lab Results  Component Value Date   ALT 32 09/16/2020   AST 35 09/16/2020   ALKPHOS 69 09/16/2020   BILITOT 1.0 09/16/2020   Lab Results  Component Value Date   CHOL 101 (L) 11/25/2015   HDL 42 11/25/2015   LDLCALC 25 11/25/2015   TRIG 170 (H) 11/25/2015   CHOLHDL 2.4 11/25/2015    Lab Results  Component Value Date   HGBA1C 6.8 (H) 11/25/2015    Review of Prior Studies: Echocardiogram 09/20/2013 Left ventricle: The cavity size was normal. Wall thickness    was increased in a pattern of mild LVH. The estimated    ejection fraction was 55%. Although no  diagnostic regional    wall motion abnormality was identified, this possibility    cannot be completely excluded on the basis of this study.    Septal bounce noted. Doppler parameters are consistent    with abnormal left ventricular relaxation (grade 1    diastolic dysfunction).  - Aortic valve: Trileaflet; moderately calcified leaflets.    There was no stenosis.  - Mitral valve: Moderately calcified  annulus. Mildly    calcified leaflets . No significant regurgitation.  - Left atrium: The atrium was mildly dilated.  - Right ventricle: The cavity size was normal. Systolic    function was normal.  - Tricuspid valve: Peak RV-RA gradient: 64m Hg (S).  - Pulmonary arteries: PA peak pressure: 257mHg (S).  - Inferior vena cava: The vessel was normal in size; the    respirophasic diameter changes were in the normal range (=    50%); findings are consistent with normal central venous    pressure.   NM Stress Test 11/03/2016 The left ventricular ejection fraction is normal (55-65%). Nuclear stress EF: 55%. There was no ST segment deviation noted during stress. Defect 1: There is a medium defect of moderate severity present in the basal inferoseptal and mid inferoseptal location. This is a low risk study.  Assessment & Plan   1.  Coronary artery disease: History of CABG in 2001.  Most recent stress Myoview was in 2018 and found to be normal.  Was recurrence of discomfort in his chest (likely musculoskeletal) and is concerned over same, we will schedule a Lexiscan Myoview for diagnostic prognostic purposes and management if necessary.  2.  Hypertension: Blood pressure is elevated in the morning prior to medications but does seem to do much better later in the day with exception of late evening.  Before adjusting medications I will do an echocardiogram for changes in LV function to help us[place]oreao better manage his medications.   I am noticing that his diastolic pressure is elevated in the morning prior to taking this medication.    He will continue his medications which include metoprolol, olmesartan for now.  May consider adjustments later.  He is to bring with him his blood pressure readings with a note concerning whether or not he has had his medication prior to the recording.  [facility] to have [facility] labs drawn.  We will review those once available  3.   Hypercholesterolemia: He is currently on atorvastatin 40 mg daily.  [facility]  We will review and evaluate current response to medication.  4.  Left shoulder pain: Likely related to rotator cuff injury.  He is being followed by orthopedics.  No planned repair at this time.  Having stress test and echo would help to expedite planned surgery if found to be normal.  5.  Non-insulin-dependent diabetes: [facility] TIA: He states these were ocular and is being followed by ophthalmologist.  He will continue aspirin 81 mg daily.  Review echo for PFO.  Consider carotid artery Doppler ultrasound on follow-up.  Current medicines are reviewed at length with the patient today.  I have spent 30 min's  dedicated to the care of this patient on the date of this encounter to include pre-visit review of records, assessment, management and diagnostic testing,with shared decision making. Signed, KaPhill MyronLaWest PughANP, AACC   01/02/2022  11:59 AM    [place] [address] Office [phone] Fax [phone]  Notice: This dictation was prepared with Dragon dictation along with smaller phrase technology. Any transcriptional errors that result from this process are unintentional and may not be corrected upon review.

## 2022-01-02 ENCOUNTER — Encounter: Payer: Self-pay | Admitting: Adult Health

## 2022-01-02 ENCOUNTER — Ambulatory Visit (INDEPENDENT_AMBULATORY_CARE_PROVIDER_SITE_OTHER): Payer: Medicare Other | Admitting: Adult Health

## 2022-01-02 VITALS — BP 140/70 | HR 56 | Ht 66.5 in | Wt 180.0 lb

## 2022-01-02 DIAGNOSIS — C44319 Basal cell carcinoma of skin of other parts of face: Secondary | ICD-10-CM

## 2022-01-02 DIAGNOSIS — I10 Essential (primary) hypertension: Secondary | ICD-10-CM | POA: Diagnosis not present

## 2022-01-02 DIAGNOSIS — G459 Transient cerebral ischemic attack, unspecified: Secondary | ICD-10-CM

## 2022-01-02 DIAGNOSIS — I2510 Atherosclerotic heart disease of native coronary artery without angina pectoris: Secondary | ICD-10-CM | POA: Diagnosis not present

## 2022-01-02 DIAGNOSIS — Z951 Presence of aortocoronary bypass graft: Secondary | ICD-10-CM | POA: Diagnosis not present

## 2022-01-02 DIAGNOSIS — E118 Type 2 diabetes mellitus with unspecified complications: Secondary | ICD-10-CM

## 2022-01-02 DIAGNOSIS — E7800 Pure hypercholesterolemia, unspecified: Secondary | ICD-10-CM | POA: Diagnosis not present

## 2022-01-02 NOTE — Patient Instructions (Signed)
Medication Instructions:  No Changes *If you need a refill on your cardiac medications before your next appointment, please call your pharmacy*   Lab Work: No Labs If you have labs (blood work) drawn today and your tests are completely normal, you will receive your results only by: [place] (if you have MyChart) OR A paper copy in the mail If you have any lab test that is abnormal or we need to change your treatment, we will call you to review the results.   Testing/Procedures: [address] [place] has requested that you have a Lexicographer. For further information please visit [email or web address]. Please follow instruction sheet, as given.   [address], [place] has requested that you have an echocardiogram. Echocardiography is a painless test that uses sound waves to create images of your heart. It provides your doctor with information about the size and shape of your heart and how well your heart's chambers and valves are working. This procedure takes approximately one hour. There are no restrictions for this procedure.   Follow-Up: At [facility], you and your health needs are our priority.  As part of our continuing mission to provide you with exceptional heart care, we have created designated Provider Care Teams.  These Care Teams include your primary Cardiologist (physician) and Advanced Practice Providers (APPs -  Physician Assistants and Nurse Practitioners) who all work together to provide you with the care you need, when you need it.  We recommend signing up for the patient portal called "MyChart".  Sign up information is provided on this After Visit Summary.  MyChart is used to connect with patients for Virtual Visits (Telemedicine).  Patients are able to view lab/test results, encounter notes, upcoming appointments, etc.  Non-urgent messages can be sent to your provider as well.   To learn more about what  you can do with MyChart, go to [name].    Your next appointment:   1 month(s)  The format for your next appointment:   In Person  Provider:   Sande Rives, PA-C or Jory Sims, DNP, ANP   or , Shelva Majestic, MD will plan to see you again in 1 month(s).      Important Information About Sugar

## 2022-01-14 ENCOUNTER — Telehealth: Payer: Self-pay

## 2022-01-14 NOTE — Telephone Encounter (Signed)
Detailed instructions left on the patient's answering machine. Asked to call back with any questions. S.Chenee Munns EMTP 

## 2022-01-20 ENCOUNTER — Other Ambulatory Visit (HOSPITAL_COMMUNITY): Payer: Self-pay | Admitting: Adult Health

## 2022-01-20 ENCOUNTER — Encounter (HOSPITAL_COMMUNITY): Payer: Self-pay

## 2022-01-20 ENCOUNTER — Ambulatory Visit (HOSPITAL_BASED_OUTPATIENT_CLINIC_OR_DEPARTMENT_OTHER): Payer: Medicare Other

## 2022-01-20 ENCOUNTER — Ambulatory Visit (HOSPITAL_COMMUNITY): Payer: Medicare Other

## 2022-01-20 ENCOUNTER — Ambulatory Visit (HOSPITAL_COMMUNITY): Payer: Medicare Other | Attending: Internal Medicine

## 2022-01-20 VITALS — Ht 66.5 in | Wt 180.0 lb

## 2022-01-20 DIAGNOSIS — R079 Chest pain, unspecified: Secondary | ICD-10-CM | POA: Insufficient documentation

## 2022-01-20 DIAGNOSIS — I2510 Atherosclerotic heart disease of native coronary artery without angina pectoris: Secondary | ICD-10-CM

## 2022-01-20 LAB — ECHOCARDIOGRAM COMPLETE
AR max vel: 1.66 cm2
AV Area VTI: 1.52 cm2
AV Area mean vel: 1.66 cm2
AV Mean grad: 8.5 mmHg
AV Peak grad: 16.3 mmHg
Ao pk vel: 2.02 m/s
Area-P 1/2: 3.53 cm2
S' Lateral: 3.3 cm

## 2022-01-20 LAB — MYOCARDIAL PERFUSION IMAGING
LV dias vol: 106 mL (ref 62–150)
LV sys vol: 57 mL
Nuc Stress EF: 47 %
Peak HR: 89 {beats}/min
Rest HR: 73 {beats}/min
Rest Nuclear Isotope Dose: 10.5 mCi
SDS: 0
SRS: 6
SSS: 6
ST Depression (mm): 0 mm
Stress Nuclear Isotope Dose: 31.3 mCi
TID: 1

## 2022-01-20 MED ORDER — TECHNETIUM TC 99M TETROFOSMIN IV KIT
31.3000 | PACK | Freq: Once | INTRAVENOUS | Status: AC | PRN
  Administered 2022-01-20: 31.3 via INTRAVENOUS

## 2022-01-20 MED ORDER — PERFLUTREN LIPID MICROSPHERE
1.0000 mL | INTRAVENOUS | Status: AC | PRN
  Administered 2022-01-20: 2 mL via INTRAVENOUS

## 2022-01-20 MED ORDER — TECHNETIUM TC 99M TETROFOSMIN IV KIT
10.9000 | PACK | Freq: Once | INTRAVENOUS | Status: AC | PRN
  Administered 2022-01-20: 10.9 via INTRAVENOUS

## 2022-01-20 MED ORDER — REGADENOSON 0.4 MG/5ML IV SOLN
0.4000 mg | Freq: Once | INTRAVENOUS | Status: AC
  Administered 2022-01-20: 0.4 mg via INTRAVENOUS

## 2022-01-21 ENCOUNTER — Telehealth: Payer: Self-pay

## 2022-01-21 NOTE — Telephone Encounter (Addendum)
Called patient regarding results. Patient had understanding of results----- Message from Lendon Colonel, NP sent at 01/20/2022  4:21 PM EDT ----- Stress test is negative for new areas of blood flow reduction. Old scar tissue is seen from prior heart attack. No changes or new testing is planned. Unlikely his left sided chest and left arm numbness are related to heart. Reassuring results.   KL

## 2022-01-21 NOTE — Telephone Encounter (Addendum)
Called patient regarding results. Patient had understanding of results.----- Message from Lendon Colonel, NP sent at 01/20/2022  4:17 PM EDT ----- I have reviewed his echocardiogram report. Heart pumping function is normal. He has some calcification on his aortic valve but not obstructing blood flow out of the heart. He also has some mild calcification on his tricuspid valve not concerning.  No changes in his regimen.

## 2022-01-23 NOTE — Addendum Note (Signed)
Addended by: Lendon Colonel on: 01/23/2022 07:34 AM   Modules accepted: Orders

## 2022-01-28 NOTE — Progress Notes (Signed)
Cardiology Clinic Note   Patient Name: Peter Mullen Date of Encounter: 01/30/2022  Primary Care Provider:  Haywood Pao, MD Primary Cardiologist:  Shelva Majestic, MD  Patient Profile    88 year old male patient with known history of coronary artery disease status post CABG in 2001, hypertension, history of transient second-degree AV block type I, chronic leg edema, hyperlipidemia, with other history to include type 2 diabetes on metformin and Jardiance, chronic kidney disease stage IIIb in the setting of complication for his resulting from diabetes.  Past Medical History    Past Medical History:  Diagnosis Date   Coronary artery disease    Diabetes mellitus without complication ([place])    Hyperlipidemia    Hypertension    Skin cancer    Stomach problems    Past Surgical History:  Procedure Laterality Date   CORONARY ARTERY BYPASS GRAFT  2001   x4 LIMA-LAD, SVG-PDA   PROSTATE SURGERY  1992   TONSILLECTOMY  1940   TUMOR REMOVAL  1970   fatty tissue tumor   VASECTOMY  1975    Allergies  Allergies  Allergen Reactions   Codeine Other (See Comments)    Unspecified    Cyclobenzaprine     Other reaction(s): palpitations   Nsaids Other (See Comments)    Unspecified     History of Present Illness    [name] presents today for follow up after having Lexiscan myoview for recurrent chest pain with known CAD and CABG, with other history to include hypertension not well controlled. When last seen an echocardiogram was completed to assess LV function and hypertension impact for LVH structure    He denies any new symptoms.  Remains active, bowling, working in his yard, walking.  He is medically compliant and brings with him an extensive list of his blood pressures to include whether or not he has taken his medicine when the blood pressure has been recorded.  He has multiple questions concerning his blood pressures along with other medications he is taking.   Home  Medications    Current Outpatient Medications  Medication Sig Dispense Refill   acetaminophen (TYLENOL) 650 MG CR tablet Take 1,300 mg by mouth at bedtime.     allopurinol (ZYLOPRIM) 300 MG tablet Take 300 mg by mouth daily.  5   aspirin 81 MG tablet Take 81 mg by mouth every evening.      atorvastatin (LIPITOR) 40 MG tablet TAKE 1/2 (ONE-HALF) TABLET BY MOUTH ONCE DAILY AT  6PM 45 tablet 2   BIOTIN PO Take 1 tablet by mouth daily.     BLACK ELDERBERRY PO Take 50 mg by mouth 3 (three) times a week.     cetirizine (ZYRTEC) 10 MG tablet Take 10 mg by mouth at bedtime.     cetirizine (ZYRTEC) 10 MG tablet as needed.     Cholecalciferol (VITAMIN D3) 50 MCG (2000 UT) capsule Take 2,000 Units by mouth 3 (three) times a week.     colchicine 0.6 MG tablet Take 0.6 mg by mouth daily as needed (gout flares).     Cyanocobalamin (VITAMIN B12 PO) Take 1,000 mg by mouth 3 (three) times a week.     diphenhydrAMINE (BENADRYL) 25 MG tablet 2 tablets     empagliflozin (JARDIANCE) 25 MG TABS tablet Take 12.5 mg by mouth daily.     fluticasone (FLONASE) 50 MCG/ACT nasal spray Place 1 spray into both nostrils every evening.     folic acid (FOLVITE) 144 MCG tablet Take  400 mcg by mouth 3 (three) times a week.     furosemide (LASIX) 40 MG tablet Take 1 tablet by mouth once daily (Patient taking differently: Take 40 mg by mouth every other day.) 90 tablet 3   guaiFENesin (MUCINEX) 600 MG 12 hr tablet Take 600 mg by mouth daily.     icosapent Ethyl (VASCEPA) 1 g capsule Take 2 capsules (2 g total) by mouth 2 (two) times daily. 120 capsule 2   Melatonin 10 MG TABS Take 10 mg by mouth at bedtime.     metFORMIN (GLUCOPHAGE) 500 MG tablet Take 1 tablet by mouth 2 (two) times daily.     metoprolol succinate (TOPROL-XL) 25 MG 24 hr tablet Take 1/2 (one-half) tablet by mouth once daily 60 tablet 2   montelukast (SINGULAIR) 10 MG tablet Take 10 mg by mouth at bedtime.      montelukast (SINGULAIR) 10 MG tablet Take 1 tablet  by mouth daily.     Multiple Vitamins-Minerals (ZINC PO) Take 1 tablet by mouth 3 (three) times a week.     nitroGLYCERIN (NITROSTAT) 0.4 MG SL tablet Place 1 tablet under the tongue daily as needed for chest pain.     olmesartan (BENICAR) 20 MG tablet Take 1 tablet by mouth once daily 90 tablet 3   omeprazole (PRILOSEC) 20 MG capsule Take 20 mg by mouth 2 (two) times daily before a meal.     oxymetazoline (AFRIN) 0.05 % nasal spray Place 1 spray into both nostrils at bedtime.     testosterone (ANDROGEL) 50 MG/5GM GEL Place 5 g onto the skin 2 (two) times daily.      vitamin E 400 UNIT capsule Take 400 Units by mouth 3 (three) times a week.     No current facility-administered medications for this visit.     Family History    Family History  Problem Relation Age of Onset   Diabetes Mother    Heart disease Mother    Stroke Mother    Lung cancer Father    Heart disease Maternal Grandfather    Leukemia Brother    Stroke Paternal Grandfather    He indicated that his mother is deceased. He indicated that his father is deceased. He indicated that the status of his brother is unknown. He indicated that his maternal grandfather is deceased. He indicated that the status of his paternal grandfather is unknown.  Social History    Social History   Socioeconomic History   Marital status: Married    Spouse name: Not on file   Number of children: Not on file   Years of education: Not on file   Highest education level: Not on file  Occupational History   Not on file  Tobacco Use   Smoking status: Never   Smokeless tobacco: Never  Substance and Sexual Activity   Alcohol use: Yes    Alcohol/week: 2.0 standard drinks of alcohol    Types: 2 Standard drinks or equivalent per week   Drug use: Not on file   Sexual activity: Not on file  Other Topics Concern   Not on file  Social History Narrative   Not on file   Social Determinants of Health   Financial Resource Strain: Not on file   Food Insecurity: Not on file  Transportation Needs: Not on file  Physical Activity: Not on file  Stress: Not on file  Social Connections: Not on file  Intimate Partner Violence: Not on file     Review  of Systems    General:  No chills, fever, night sweats or weight changes.  Cardiovascular:  No chest pain, dyspnea on exertion, edema, orthopnea, palpitations, paroxysmal nocturnal dyspnea. Dermatological: No rash, lesions/masses Respiratory: No cough, dyspnea Urologic: No hematuria, dysuria Abdominal:   No nausea, vomiting, diarrhea, bright red blood per rectum, melena, or hematemesis Neurologic:  No visual changes, wkns, changes in mental status. All other systems reviewed and are otherwise negative except as noted above.     Physical Exam    VS:  BP (!) 140/58   Pulse 65   Ht '5\' 6"'$  (1.676 m)   Wt 181 lb 12.8 oz (82.5 kg)   SpO2 97%   BMI 29.34 kg/m  , BMI Body mass index is 29.34 kg/m.     GEN: Well nourished, well developed, in no acute distress. HEENT: normal. Neck: Supple, no JVD, carotid bruits, or masses. Cardiac: RRR, no murmurs, rubs, or gallops. No clubbing, cyanosis, edema.  Radials/DP/PT 2+ and equal bilaterally.  Respiratory:  Respirations regular and unlabored, clear to auscultation bilaterally. GI: Soft, nontender, nondistended, BS + x 4. MS: no deformity or atrophy. Skin: warm and dry, no rash. Neuro:  Strength and sensation are intact. Psych: Normal affect.  Accessory Clinical Findings      Lab Results  Component Value Date   WBC 11.1 (H) 09/16/2020   HGB 16.6 09/16/2020   HCT 52.3 (H) 09/16/2020   MCV 93.7 09/16/2020   PLT 216 09/16/2020   Lab Results  Component Value Date   CREATININE 1.77 (H) 09/16/2020   BUN 24 (H) 09/16/2020   NA 138 09/16/2020   K 3.9 09/16/2020   CL 101 09/16/2020   CO2 24 09/16/2020   Lab Results  Component Value Date   ALT 32 09/16/2020   AST 35 09/16/2020   ALKPHOS 69 09/16/2020   BILITOT 1.0 09/16/2020    Lab Results  Component Value Date   CHOL 101 (L) 11/25/2015   HDL 42 11/25/2015   LDLCALC 25 11/25/2015   TRIG 170 (H) 11/25/2015   CHOLHDL 2.4 11/25/2015    Lab Results  Component Value Date   HGBA1C 6.8 (H) 11/25/2015    Review of Prior Studies: Lexiscan Myoview 01/20/2022  Findings are consistent with prior myocardial infarction. The study is intermediate risk.   No ST deviation was noted.   LV perfusion is abnormal. Defect 1: There is a medium defect with moderate reduction in uptake present in the mid to basal inferior and inferoseptal location(s) that is fixed. There is abnormal wall motion in the defect area. Consistent with infarction.   Left ventricular function is abnormal. Global function is mildly reduced. Nuclear stress EF: 47 %. The left ventricular ejection fraction is mildly decreased (45-54%). End diastolic cavity size is mildly enlarged. End systolic cavity size is normal.   Prior study available for comparison from 11/03/2016. There are changes compared to prior study. The left ventricular ejection fraction has decreased. LVEF 55%, small prior basal to mid inferoseptal infarct   Medium sized, moderate intensity fixed basal to mid inferior, inferoseptal perfusion defect, likely scar, without reversible ischemia. LVEF 47% with inferior and inferoseptal hypokinesis. This is an intermediate risk study. Compared to a prior study in 2018, the LVEF is lower, but the perfusion defect is similar.  Echocardiogram 01/20/2022 1. Left ventricular ejection fraction, by estimation, is 45 to 50% with  significant beat to beat variability. The left ventricle has mildly  decreased function. The left ventricle has no regional  wall motion  abnormalities. Left ventricular diastolic  parameters are consistent with Grade I diastolic dysfunction (impaired  relaxation).   2. Right ventricular systolic function is mildly reduced. The right  ventricular size is mildly enlarged. There is  mildly elevated pulmonary  artery systolic pressure. The estimated right ventricular systolic  pressure is 19.5 mmHg.   3. Right atrial size was mildly dilated.   4. The mitral valve is grossly normal. Mild mitral valve regurgitation.  No evidence of mitral stenosis.   5. Tricuspid valve regurgitation is mild to moderate.   6. The aortic valve is abnormal. There is moderate calcification of the  aortic valve. Aortic valve regurgitation is trivial. Aortic valve  sclerosis/calcification is present, without any evidence of aortic  stenosis.   7. Pulmonic valve regurgitation is moderate.   8. The inferior vena cava is normal in size with greater than 50%  respiratory variability, suggesting right atrial pressure of 3 mmHg.   Assessment & Plan   1.  Hypertension: Review of his extensive list of blood pressures, demonstrates he has good control of blood pressure on medication regimen with an average blood pressure after medications 132/78.  He does have occasional elevations at 157/98, and low readings of 109/66 which has been asymptomatic for him.  They travel a lot and eat out a lot which may be contributing to some of the blood pressure elevations with salt content.  I will continue his current antihypertensive regimen without changes.  2.  Coronary artery disease: History of coronary artery bypass grafting, in 2001.  Recent Lexiscan Myoview revealed no new areas of ischemia but old scar was noted.  No changes from prior evaluation.  Continue secondary management with blood pressure control and statin therapy along with aspirin.  3.  Grade 1 diastolic dysfunction: Noted on echocardiogram.  He continues to take Lasix 40 mg daily and feels like he is diuresing too much.  We will change this to every other day, or we can make it 20 mg daily if he prefers.  He prefers every other day at this time.  He will follow-up with primary care for ongoing evaluation and labs, and be seen again in 6 months.  If  labs were not drawn by then we will repeat a BMET  4.  Hypercholesterolemia: Remains on atorvastatin 40 mg daily, with goal of LDL less than 70.  He is dieting trying to lose about 10 pounds and reducing fast food and cholesterol rich foods as well.   Current medicines are reviewed at length with the patient today.  I have spent 30 min's  dedicated to the care of this patient on the date of this encounter to include pre-visit review of records, assessment, management and diagnostic testing,with shared decision making. Signed, Phill Myron. West Pugh, ANP, AACC   01/30/2022 12:30 PM    [facility] [address] Office [phone] Fax [phone]  Notice: This dictation was prepared with Dragon dictation along with smaller phrase technology. Any transcriptional errors that result from this process are unintentional and may not be corrected upon review.

## 2022-01-30 ENCOUNTER — Encounter: Payer: Self-pay | Admitting: Adult Health

## 2022-01-30 ENCOUNTER — Ambulatory Visit: Payer: Medicare Other | Admitting: Adult Health

## 2022-01-30 VITALS — BP 140/58 | HR 65 | Ht 66.0 in | Wt 181.8 lb

## 2022-01-30 DIAGNOSIS — I2510 Atherosclerotic heart disease of native coronary artery without angina pectoris: Secondary | ICD-10-CM | POA: Diagnosis not present

## 2022-01-30 DIAGNOSIS — E7800 Pure hypercholesterolemia, unspecified: Secondary | ICD-10-CM

## 2022-01-30 DIAGNOSIS — I10 Essential (primary) hypertension: Secondary | ICD-10-CM

## 2022-01-30 DIAGNOSIS — I5032 Chronic diastolic (congestive) heart failure: Secondary | ICD-10-CM

## 2022-01-30 DIAGNOSIS — Z951 Presence of aortocoronary bypass graft: Secondary | ICD-10-CM | POA: Diagnosis not present

## 2022-01-30 NOTE — Patient Instructions (Signed)
Medication Instructions:  Lasix 40 mg ( Take 1 Tablet Every Other Day). *If you need a refill on your cardiac medications before your next appointment, please call your pharmacy*   Lab Work: No labs If you have labs (blood work) drawn today and your tests are completely normal, you will receive your results only by: [place] (if you have MyChart) OR A paper copy in the mail If you have any lab test that is abnormal or we need to change your treatment, we will call you to review the results.   Testing/Procedures: No Testing    Follow-Up: At [facility], you and your health needs are our priority.  As part of our continuing mission to provide you with exceptional heart care, we have created designated Provider Care Teams.  These Care Teams include your primary Cardiologist (physician) and Advanced Practice Providers (APPs -  Physician Assistants and Nurse Practitioners) who all work together to provide you with the care you need, when you need it.  We recommend signing up for the patient portal called "MyChart".  Sign up information is provided on this After Visit Summary.  MyChart is used to connect with patients for Virtual Visits (Telemedicine).  Patients are able to view lab/test results, encounter notes, upcoming appointments, etc.  Non-urgent messages can be sent to your provider as well.   To learn more about what you can do with MyChart, go to [name].    Your next appointment:   6 month(s)  The format for your next appointment:   In Person  Provider:   Shelva Majestic, MD       Important Information About Sugar

## 2022-03-16 ENCOUNTER — Ambulatory Visit (INDEPENDENT_AMBULATORY_CARE_PROVIDER_SITE_OTHER): Payer: Medicare Other | Admitting: Physician Assistant

## 2022-03-16 ENCOUNTER — Encounter: Payer: Self-pay | Admitting: Physician Assistant

## 2022-03-16 ENCOUNTER — Ambulatory Visit (INDEPENDENT_AMBULATORY_CARE_PROVIDER_SITE_OTHER): Payer: Medicare Other

## 2022-03-16 VITALS — BP 126/60 | HR 58 | Ht 66.0 in | Wt 189.0 lb

## 2022-03-16 DIAGNOSIS — I10 Essential (primary) hypertension: Secondary | ICD-10-CM | POA: Diagnosis not present

## 2022-03-16 DIAGNOSIS — I25810 Atherosclerosis of coronary artery bypass graft(s) without angina pectoris: Secondary | ICD-10-CM | POA: Diagnosis not present

## 2022-03-16 DIAGNOSIS — E785 Hyperlipidemia, unspecified: Secondary | ICD-10-CM | POA: Diagnosis not present

## 2022-03-16 DIAGNOSIS — R002 Palpitations: Secondary | ICD-10-CM

## 2022-03-16 DIAGNOSIS — N183 Chronic kidney disease, stage 3 unspecified: Secondary | ICD-10-CM

## 2022-03-16 DIAGNOSIS — E119 Type 2 diabetes mellitus without complications: Secondary | ICD-10-CM

## 2022-03-16 NOTE — Patient Instructions (Addendum)
Medication Instructions:  Your physician recommends that you continue on your current medications as directed. Please refer to the Current Medication list given to you today.  *If you need a refill on your cardiac medications before your next appointment, please call your pharmacy*  Lab Work: NONE ordered at this time of appointment   If you have labs (blood work) drawn today and your tests are completely normal, you will receive your results only by: [place] (if you have MyChart) OR A paper copy in the mail If you have any lab test that is abnormal or we need to change your treatment, we will call you to review the results.  Testing/Procedures:  [facility]- Long Term Monitor Instructions  Your physician has requested you wear a ZIO patch monitor for 14 days.  This is a single patch monitor. [employer] supplies one patch monitor per enrollment. Additional stickers are not available. Please do not apply patch if you will be having a Nuclear Stress Test,  Echocardiogram, Cardiac CT, MRI, or Chest Xray during the period you would be wearing the  monitor. The patch cannot be worn during these tests. You cannot remove and re-apply the  ZIO XT patch monitor.  Your ZIO patch monitor will be mailed 3 day USPS to your address on file. It may take 3-5 days  to receive your monitor after you have been enrolled.  Once you have received your monitor, please review the enclosed instructions. Your monitor  has already been registered assigning a specific monitor serial # to you.  Billing and Patient Assistance Program Information  We have supplied [employer] with any of your insurance information on file for billing purposes. [employer] offers a sliding scale Patient Assistance Program for patients that do not have  insurance, or whose insurance does not completely cover the cost of the ZIO monitor.  You must apply for the Patient Assistance Program to qualify for this discounted rate.  To apply,  please call [employer] at [phone], select option 4, select option 2, ask to apply for  Patient Assistance Program. Theodore Demark will ask your household income, and how many people  are in your household. They will quote your out-of-pocket cost based on that information.  [employer] will also be able to set up a 67-month interest-free payment plan if needed.  Applying the monitor   Shave hair from upper left chest.  Hold abrader disc by orange tab. Rub abrader in 40 strokes over the upper left chest as  indicated in your monitor instructions.  Clean area with 4 enclosed alcohol pads. Let dry.  Apply patch as indicated in monitor instructions. Patch will be placed under collarbone on left  side of chest with arrow pointing upward.  Rub patch adhesive wings for 2 minutes. Remove white label marked "1". Remove the white  label marked "2". Rub patch adhesive wings for 2 additional minutes.  While looking in a mirror, press and release button in center of patch. A small green light will  flash 3-4 times. This will be your only indicator that the monitor has been turned on.  Do not shower for the first 24 hours. You may shower after the first 24 hours.  Press the button if you feel a symptom. You will hear a small click. Record Date, Time and  Symptom in the Patient Logbook.  When you are ready to remove the patch, follow instructions on the last 2 pages of Patient  Logbook. Stick patch monitor onto the last page of  Patient Logbook.  Place Patient Logbook in the blue and white box. Use locking tab on box and tape box closed  securely. The blue and white box has prepaid postage on it. Please place it in the mailbox as  soon as possible. Your physician should have your test results approximately 7 days after the  monitor has been mailed back to [facility].  Call [facility] at [phone] if you have questions regarding  your ZIO XT patch monitor. Call them immediately if you see  an orange light blinking on your  monitor.  If your monitor falls off in less than 4 days, contact our Monitor department at [phone].  If your monitor becomes loose or falls off after 4 days call [employer] at [phone] for  suggestions on securing your monitor   Follow-Up: At [facility], you and your health needs are our priority.  As part of our continuing mission to provide you with exceptional heart care, we have created designated Provider Care Teams.  These Care Teams include your primary Cardiologist (physician) and Advanced Practice Providers (APPs -  Physician Assistants and Nurse Practitioners) who all work together to provide you with the care you need, when you need it.  Your next appointment:   6-8 week(s)  The format for your next appointment:   In Person  Provider:   APP       Other Instructions   Important Information About Sugar

## 2022-03-16 NOTE — Progress Notes (Unsigned)
ZIO XT - 14 monitor mailed to pt's home address.  Dr. Claiborne Billings to read

## 2022-03-16 NOTE — Progress Notes (Signed)
Cardiology Office Note:    Date:  03/18/2022   ID:  Peter Mullen, DOB 06/09/1932, MRN [id number]  PCP:  Haywood Pao, MD   [facility] Cardiologist:  Shelva Majestic, MD     Referring MD: Haywood Pao, MD   Chief Complaint  Patient presents with   Follow-up    Seen for Dr. Claiborne Billings    History of Present Illness:    Peter Mullen is a 88 y.o. male with a hx of CAD s/p CABG (LIMA-LAD, SVG-Diagonal, SVG-PLA, SVG-PDA) in March 2001, hypertension, hyperlipidemia, DM2, stage III CKD, mild OSA not on CPAP, and obesity. Echocardiogram in February 2015 showed normal EF of 55%, no regional wall motion abnormality, grade 1 DD.  EKG demonstrated probable second-degree AV block type I in 2016.  Toprol-XL was reduced to 50 mg daily, this was later further cut down to 25 mg daily.  Nuclear stress test in April 2018 was low risk with very small prior basal mid inferoseptal defect without associated ischemia, EF 55%.  In October over 2019, patient was admitted with head trauma after falling while bowling that resulted in a small subdural hematoma.  When he was seen in April 2022, he continued to have type I Mobitz heart block, Toprol-XL was further decreased to 12.5 mg every other day.  Echocardiogram obtained on 01/20/2022 showed EF 45 to 50%, significant beat-to-beat variability, grade 1 DD, RVSP 40.5 mmHg, mild MR, mild to moderate TR.  Myoview obtained on 01/20/2022 came back intermediate risk, EF 47%, medium defect of moderate reduction in uptake present in the mid to basal inferior and inferoseptal location that is fixed, consistent with previous infarction.  Perfusion defect is very similar to the previous report from 2018.  Patient was last seen by Bunnie Domino on 01/30/2022 at which time he was doing well.  Diuretic was decreased.  Patient presents today for follow-up.  He complains of occasional palpitations.  EKG shows sinus rhythm, right bundle branch block,  left anterior fascicular block and Mobitz 1 heart block.  Given the underlying conduction disorder, he would not be a good candidate to start on rate control therapy.  I recommended a heart monitor to assess the palpitation.  Otherwise, he does not have significant chest pain, shortness of breath or signs of heart failure.  We will reassess the patient in 6 weeks.  Past Medical History:  Diagnosis Date   Coronary artery disease    Diabetes mellitus without complication ([place])    Hyperlipidemia    Hypertension    Skin cancer    Stomach problems     Past Surgical History:  Procedure Laterality Date   CORONARY ARTERY BYPASS GRAFT  2001   x4 LIMA-LAD, SVG-PDA   PROSTATE SURGERY  1992   TONSILLECTOMY  1940   TUMOR REMOVAL  1970   fatty tissue tumor   VASECTOMY  1975    Current Medications: Current Meds  Medication Sig   acetaminophen (TYLENOL) 500 MG tablet Take 2 tablets by mouth at bedtime.   allopurinol (ZYLOPRIM) 300 MG tablet Take 300 mg by mouth daily.   aspirin 81 MG tablet Take 81 mg by mouth every evening.    atorvastatin (LIPITOR) 40 MG tablet TAKE 1/2 (ONE-HALF) TABLET BY MOUTH ONCE DAILY AT  6PM   BIOTIN PO Take 10,000 mcg by mouth daily.   BLACK ELDERBERRY PO Take 50 mg by mouth 3 (three) times a week.   cetirizine (ZYRTEC) 10 MG tablet as  needed.   diphenhydrAMINE (BENADRYL) 25 MG tablet 2 tablets   empagliflozin (JARDIANCE) 25 MG TABS tablet Take 12.5 mg by mouth daily.   fluticasone (FLONASE) 50 MCG/ACT nasal spray Place 1 spray into both nostrils every evening.   furosemide (LASIX) 40 MG tablet Take 1 tablet by mouth once daily (Patient taking differently: Take 40 mg by mouth every other day.)   guaiFENesin (MUCINEX) 600 MG 12 hr tablet Take 2 tablets by mouth at bedtime.   icosapent Ethyl (VASCEPA) 1 g capsule Take 2 capsules (2 g total) by mouth 2 (two) times daily.   Melatonin 10 MG TABS Take 10 mg by mouth at bedtime.   metFORMIN (GLUCOPHAGE) 500 MG tablet Take  1 tablet by mouth 2 (two) times daily.   metoprolol succinate (TOPROL-XL) 25 MG 24 hr tablet Take 1/2 (one-half) tablet by mouth once daily   montelukast (SINGULAIR) 10 MG tablet Take 10 mg by mouth at bedtime.    Multiple Vitamins-Minerals (ZINC PO) Take 1 tablet by mouth 3 (three) times a week.   olmesartan (BENICAR) 20 MG tablet Take 1 tablet by mouth once daily   omeprazole (PRILOSEC) 20 MG capsule Take 20 mg by mouth 2 (two) times daily before a meal.   oxymetazoline (AFRIN) 0.05 % nasal spray Place 1 spray into both nostrils at bedtime.   testosterone (ANDROGEL) 50 MG/5GM GEL Place 5 g onto the skin 2 (two) times daily.      Allergies:   Codeine, Cyclobenzaprine, and Nsaids   Social History   Socioeconomic History   Marital status: Married    Spouse name: Not on file   Number of children: Not on file   Years of education: Not on file   Highest education level: Not on file  Occupational History   Not on file  Tobacco Use   Smoking status: Never   Smokeless tobacco: Never  Substance and Sexual Activity   Alcohol use: Yes    Alcohol/week: 2.0 standard drinks of alcohol    Types: 2 Standard drinks or equivalent per week   Drug use: Not on file   Sexual activity: Not on file  Other Topics Concern   Not on file  Social History Narrative   Not on file   Social Determinants of Health   Financial Resource Strain: Not on file  Food Insecurity: Not on file  Transportation Needs: Not on file  Physical Activity: Not on file  Stress: Not on file  Social Connections: Not on file     Family History: The patient's family history includes Diabetes in his mother; Heart disease in his maternal grandfather and mother; Leukemia in his brother; Lung cancer in his father; Stroke in his mother and paternal grandfather.  ROS:   Please see the history of present illness.     All other systems reviewed and are negative.  EKGs/Labs/Other Studies Reviewed:    The following studies were  reviewed today:  Echo 01/20/2022  1. Left ventricular ejection fraction, by estimation, is 45 to 50% with  significant beat to beat variability. The left ventricle has mildly  decreased function. The left ventricle has no regional wall motion  abnormalities. Left ventricular diastolic  parameters are consistent with Grade I diastolic dysfunction (impaired  relaxation).   2. Right ventricular systolic function is mildly reduced. The right  ventricular size is mildly enlarged. There is mildly elevated pulmonary  artery systolic pressure. The estimated right ventricular systolic  pressure is 17.4 mmHg.   3. Right  atrial size was mildly dilated.   4. The mitral valve is grossly normal. Mild mitral valve regurgitation.  No evidence of mitral stenosis.   5. Tricuspid valve regurgitation is mild to moderate.   6. The aortic valve is abnormal. There is moderate calcification of the  aortic valve. Aortic valve regurgitation is trivial. Aortic valve  sclerosis/calcification is present, without any evidence of aortic  stenosis.   7. Pulmonic valve regurgitation is moderate.   8. The inferior vena cava is normal in size with greater than 50%  respiratory variability, suggesting right atrial pressure of 3 mmHg.   Comparison(s): Prior images unable to be directly viewed, comparison made  by report only.   EKG:  EKG is ordered today.  The ekg ordered today demonstrates normal sinus rhythm, right bundle branch block, left anterior fascicular block.  Mobitz type I heart block  Recent Labs: No results found for requested labs within last 365 days.  Recent Lipid Panel    Component Value Date/Time   CHOL 101 (L) 11/25/2015 1343   TRIG 170 (H) 11/25/2015 1343   HDL 42 11/25/2015 1343   CHOLHDL 2.4 11/25/2015 1343   VLDL 34 (H) 11/25/2015 1343   LDLCALC 25 11/25/2015 1343     Risk Assessment/Calculations:           Physical Exam:    VS:  BP 126/60 (BP Location: Left Arm, Patient Position:  Sitting, Cuff Size: Normal)   Pulse (!) 58   Ht '5\' 6"'$  (1.676 m)   Wt 189 lb (85.7 kg)   SpO2 91%   BMI 30.51 kg/m        Wt Readings from Last 3 Encounters:  03/16/22 189 lb (85.7 kg)  01/30/22 181 lb 12.8 oz (82.5 kg)  01/20/22 180 lb (81.6 kg)     GEN:  Well nourished, well developed in no acute distress HEENT: Normal NECK: No JVD; No carotid bruits LYMPHATICS: No lymphadenopathy CARDIAC: RRR, no murmurs, rubs, gallops RESPIRATORY:  Clear to auscultation without rales, wheezing or rhonchi  ABDOMEN: Soft, non-tender, non-distended MUSCULOSKELETAL:  No edema; No deformity  SKIN: Warm and dry NEUROLOGIC:  Alert and oriented x 3 PSYCHIATRIC:  Normal affect   ASSESSMENT:    1. Palpitations   2. Coronary artery disease involving coronary bypass graft of native heart without angina pectoris   3. Essential hypertension   4. Hyperlipidemia with target LDL less than 70   5. Controlled type 2 diabetes mellitus without complication, without long-term current use of insulin (HCC)   6. Stage 3 chronic kidney disease, unspecified whether stage 3a or 3b CKD ([place])    PLAN:    In order of problems listed above:  Palpitation: We will obtain 2-week heart monitor.  We will try to avoid any rate control medication given underlying Mobitz 1 type heart block, right bundle branch block and left anterior fascicular block, patient clearly has conduction disorder which can be worsened with rate control therapy.  We will follow-up in 6 weeks to review heart monitor results  CAD s/p CABG: Denies any recent chest pain.  Recent Myoview obtained on 01/20/2022 shows previous infarction, no significant ischemia.  Hypertension: Blood pressure stable  Hyperlipidemia: Continue Lipitor  CKD stage III: Followed by primary care provider.           Medication Adjustments/Labs and Tests Ordered: Current medicines are reviewed at length with the patient today.  Concerns regarding medicines are outlined  above.  Orders Placed This Encounter  Procedures  LONG TERM MONITOR (3-14 DAYS)   EKG 12-Lead   No orders of the defined types were placed in this encounter.   Patient Instructions  Medication Instructions:  Your physician recommends that you continue on your current medications as directed. Please refer to the Current Medication list given to you today.  *If you need a refill on your cardiac medications before your next appointment, please call your pharmacy*  Lab Work: NONE ordered at this time of appointment   If you have labs (blood work) drawn today and your tests are completely normal, you will receive your results only by: [place] (if you have MyChart) OR A paper copy in the mail If you have any lab test that is abnormal or we need to change your treatment, we will call you to review the results.  Testing/Procedures:  [facility]- Long Term Monitor Instructions  Your physician has requested you wear a ZIO patch monitor for 14 days.  This is a single patch monitor. [employer] supplies one patch monitor per enrollment. Additional stickers are not available. Please do not apply patch if you will be having a Nuclear Stress Test,  Echocardiogram, Cardiac CT, MRI, or Chest Xray during the period you would be wearing the  monitor. The patch cannot be worn during these tests. You cannot remove and re-apply the  ZIO XT patch monitor.  Your ZIO patch monitor will be mailed 3 day USPS to your address on file. It may take 3-5 days  to receive your monitor after you have been enrolled.  Once you have received your monitor, please review the enclosed instructions. Your monitor  has already been registered assigning a specific monitor serial # to you.  Billing and Patient Assistance Program Information  We have supplied [employer] with any of your insurance information on file for billing purposes. [employer] offers a sliding scale Patient Assistance Program for patients that do not have   insurance, or whose insurance does not completely cover the cost of the ZIO monitor.  You must apply for the Patient Assistance Program to qualify for this discounted rate.  To apply, please call [employer] at [phone], select option 4, select option 2, ask to apply for  Patient Assistance Program. Theodore Demark will ask your household income, and how many people  are in your household. They will quote your out-of-pocket cost based on that information.  [employer] will also be able to set up a 2-month interest-free payment plan if needed.  Applying the monitor   Shave hair from upper left chest.  Hold abrader disc by orange tab. Rub abrader in 40 strokes over the upper left chest as  indicated in your monitor instructions.  Clean area with 4 enclosed alcohol pads. Let dry.  Apply patch as indicated in monitor instructions. Patch will be placed under collarbone on left  side of chest with arrow pointing upward.  Rub patch adhesive wings for 2 minutes. Remove white label marked "1". Remove the white  label marked "2". Rub patch adhesive wings for 2 additional minutes.  While looking in a mirror, press and release button in center of patch. A small green light will  flash 3-4 times. This will be your only indicator that the monitor has been turned on.  Do not shower for the first 24 hours. You may shower after the first 24 hours.  Press the button if you feel a symptom. You will hear a small click. Record Date, Time and  Symptom in the Patient Logbook.  When you are ready to remove the patch, follow instructions on the last 2 pages of Patient  Logbook. Stick patch monitor onto the last page of Patient Logbook.  Place Patient Logbook in the blue and white box. Use locking tab on box and tape box closed  securely. The blue and white box has prepaid postage on it. Please place it in the mailbox as  soon as possible. Your physician should have your test results approximately 7 days after the  monitor  has been mailed back to [facility]  Call [place] at [phone] if you have questions regarding  your ZIO XT patch monitor. Call them immediately if you see an orange light blinking on your  monitor.  If your monitor falls off in less than 4 days, contact our Monitor department at [phone].  If your monitor becomes loose or falls off after 4 days call [employer] at [phone] for  suggestions on securing your monitor   Follow-Up: At [facility], you and your health needs are our priority.  As part of our continuing mission to provide you with exceptional heart care, we have created designated Provider Care Teams.  These Care Teams include your primary Cardiologist (physician) and Advanced Practice Providers (APPs -  Physician Assistants and Nurse Practitioners) who all work together to provide you with the care you need, when you need it.  Your next appointment:   6-8 week(s)  The format for your next appointment:   In Person  Provider:   APP       Other Instructions   Important Information About Sugar         Hilbert Corrigan, [place]  03/18/2022 11:52 PM    [place]

## 2022-03-18 ENCOUNTER — Encounter: Payer: Self-pay | Admitting: Physician Assistant

## 2022-04-27 ENCOUNTER — Encounter: Payer: Self-pay | Admitting: Physician Assistant

## 2022-04-27 ENCOUNTER — Ambulatory Visit: Payer: Medicare Other | Attending: Physician Assistant | Admitting: Physician Assistant

## 2022-04-27 VITALS — BP 130/60 | HR 63 | Ht 66.5 in | Wt 188.8 lb

## 2022-04-27 DIAGNOSIS — N183 Chronic kidney disease, stage 3 unspecified: Secondary | ICD-10-CM

## 2022-04-27 DIAGNOSIS — E785 Hyperlipidemia, unspecified: Secondary | ICD-10-CM

## 2022-04-27 DIAGNOSIS — I10 Essential (primary) hypertension: Secondary | ICD-10-CM | POA: Diagnosis not present

## 2022-04-27 DIAGNOSIS — E119 Type 2 diabetes mellitus without complications: Secondary | ICD-10-CM

## 2022-04-27 DIAGNOSIS — I25810 Atherosclerosis of coronary artery bypass graft(s) without angina pectoris: Secondary | ICD-10-CM

## 2022-04-27 DIAGNOSIS — R001 Bradycardia, unspecified: Secondary | ICD-10-CM

## 2022-04-27 NOTE — Patient Instructions (Signed)
Medication Instructions:  Your physician recommends that you continue on your current medications as directed. Please refer to the Current Medication list given to you today.  *If you need a refill on your cardiac medications before your next appointment, please call your pharmacy*  Lab Work: NONE ordered at this time of appointment   If you have labs (blood work) drawn today and your tests are completely normal, you will receive your results only by: [place] (if you have MyChart) OR A paper copy in the mail If you have any lab test that is abnormal or we need to change your treatment, we will call you to review the results.  Testing/Procedures: You have been referred to Cardiac Electrophysiology   Follow-Up: At [facility], you and your health needs are our priority.  As part of our continuing mission to provide you with exceptional heart care, we have created designated Provider Care Teams.  These Care Teams include your primary Cardiologist (physician) and Advanced Practice Providers (APPs -  Physician Assistants and Nurse Practitioners) who all work together to provide you with the care you need, when you need it.   Your next appointment:   6 month(s)  The format for your next appointment:   In Person  Provider:   Shelva Majestic, MD     Other Instructions   Important Information About Sugar

## 2022-04-27 NOTE — Progress Notes (Unsigned)
Cardiology Office Note:    Date:  04/29/2022   ID:  DAREK EIFLER, DOB 08/06/1931, MRN [id number]  PCP:  Haywood Pao, MD   [facility] Prov[facility] Cardiologist:  Shelva Majestic, MD     Referring MD: Haywood Pao, MD   Chief Complaint  Patient presents with   Follow-up    Seen for Dr. Claiborne Billings    History of Present Illness:    Peter Mullen is a 88 y.o. male with a hx of CAD s/p CABG (LIMA-LAD, SVG-Diagonal, SVG-PLA, SVG-PDA) in March 2001, hypertension, hyperlipidemia, DM2, stage III CKD, mild OSA not on CPAP, and obesity. Echocardiogram in February 2015 showed normal EF of 55%, no regional wall motion abnormality, grade 1 DD.  EKG demonstrated probable second-degree AV block type I in 2016.  Toprol-XL was reduced to 50 mg daily, this was later further cut down to 25 mg daily.  Nuclear stress test in April 2018 was low risk with very small prior basal mid inferoseptal defect without associated ischemia, EF 55%.  In October 2019, patient was admitted with head trauma after falling while bowling that resulted in a small subdural hematoma.  When he was seen in April 2022, he continued to have type I Mobitz heart block, Toprol-XL was further decreased to 12.5 mg every other day.  Echocardiogram obtained on 01/20/2022 showed EF 45 to 50%, significant beat-to-beat variability, grade 1 DD, RVSP 40.5 mmHg, mild MR, mild to moderate TR.  Myoview obtained on 01/20/2022 came back intermediate risk, EF 47%, medium defect of moderate reduction in uptake present in the mid to basal inferior and inferoseptal location that is fixed, consistent with previous infarction.  Perfusion defect is very similar to the previous report from 2018.  Patient was last seen by Bunnie Domino on 01/30/2022 at which time he was doing well.  Diuretic was decreased.  I last saw the patient on 03/16/2022 with palpitation.  EKG showed sinus rhythm, right bundle branch block, left anterior fascicular block and  Mobitz 1 heart block.  Given underlying conduction disorder, I did not wish to start him on rate control therapy.  I recommend a heart monitor to further assess the palpitation.   Patient presents today for follow-up.  We reviewed the recent heart monitor.  Minimal heart rate of 22 bpm, maximal heart rate 203 bpm.  Average heart rate of 66.  There was 1 run of supraventricular tachycardia that lasted 4 beats.  There are multiple winky block heart block present.  PVC burden was high at 11.3%.  Talking with the patient, he continued to have palpitation episodes.  He denies any significant dizziness, blurred vision or feeling of passing out.  I discussed the recent heart monitor both with the patient and Dr. Claiborne Billings.  Given lack of symptom, he may not need a pacemaker now but likely will need a pacemaker in the future.  Dr. Claiborne Billings recommended referral to electrophysiology service so the patient can be followed by EP service on the side.   Past Medical History:  Diagnosis Date   Coronary artery disease    Diabetes mellitus without complication ([place])    Hyperlipidemia    Hypertension    Skin cancer    Stomach problems     Past Surgical History:  Procedure Laterality Date   CORONARY ARTERY BYPASS GRAFT  2001   x4 LIMA-LAD, SVG-PDA   PROSTATE SURGERY  1992   TONSILLECTOMY  1940   TUMOR REMOVAL  1970   fatty  tissue tumor   VASECTOMY  1975    Current Medications: Current Meds  Medication Sig   acetaminophen (TYLENOL) 500 MG tablet Take 2 tablets by mouth at bedtime.   allopurinol (ZYLOPRIM) 300 MG tablet Take 300 mg by mouth daily.   Ascorbic Acid (VITAMIN C PO) Take by mouth 2 (two) times a week.   aspirin 81 MG tablet Take 81 mg by mouth every evening.    atorvastatin (LIPITOR) 40 MG tablet TAKE 1/2 (ONE-HALF) TABLET BY MOUTH ONCE DAILY AT  6PM   BIOTIN PO Take 10,000 mcg by mouth daily.   BLACK ELDERBERRY PO Take 50 mg by mouth 3 (three) times a week.   cetirizine (ZYRTEC) 10 MG tablet  Take 10 mg by mouth at bedtime.   Cholecalciferol (VITAMIN D3) 50 MCG (2000 UT) capsule Take 2,000 Units by mouth 3 (three) times a week.   colchicine 0.6 MG tablet Take 0.6 mg by mouth daily as needed (gout flares).   Cyanocobalamin (VITAMIN B12 PO) Take 1,000 mg by mouth 3 (three) times a week.   diclofenac Sodium (VOLTAREN) 1 % GEL Apply topically as needed.   diphenhydrAMINE (BENADRYL) 25 MG tablet 2 tablets   empagliflozin (JARDIANCE) 25 MG TABS tablet Take 12.5 mg by mouth daily.   fluticasone (FLONASE) 50 MCG/ACT nasal spray Place 1 spray into both nostrils every evening.   folic acid (FOLVITE) 350 MCG tablet Take 400 mcg by mouth 3 (three) times a week.   furosemide (LASIX) 40 MG tablet Take 1 tablet by mouth once daily (Patient taking differently: Take 40 mg by mouth every other day.)   guaiFENesin (MUCINEX) 600 MG 12 hr tablet Take 2 tablets by mouth at bedtime.   icosapent Ethyl (VASCEPA) 1 g capsule Take 2 capsules (2 g total) by mouth 2 (two) times daily.   Melatonin 10 MG TABS Take 10 mg by mouth at bedtime.   metFORMIN (GLUCOPHAGE) 500 MG tablet Take 1 tablet by mouth 2 (two) times daily.   metoprolol succinate (TOPROL-XL) 25 MG 24 hr tablet Take 1/2 (one-half) tablet by mouth once daily   montelukast (SINGULAIR) 10 MG tablet Take 10 mg by mouth at bedtime.    Multiple Vitamins-Minerals (ZINC PO) Take 1 tablet by mouth 3 (three) times a week.   olmesartan (BENICAR) 20 MG tablet Take 1 tablet by mouth once daily   omeprazole (PRILOSEC) 20 MG capsule Take 20 mg by mouth 2 (two) times daily before a meal.   oxymetazoline (AFRIN) 0.05 % nasal spray Place 1 spray into both nostrils at bedtime.   testosterone (ANDROGEL) 50 MG/5GM GEL Place 5 g onto the skin 2 (two) times daily.    vitamin E 400 UNIT capsule Take 400 Units by mouth 3 (three) times a week.     Allergies:   Codeine, Cyclobenzaprine, and Nsaids   Social History   Socioeconomic History   Marital status: Married     Spouse name: Not on file   Number of children: Not on file   Years of education: Not on file   Highest education level: Not on file  Occupational History   Not on file  Tobacco Use   Smoking status: Never   Smokeless tobacco: Never  Substance and Sexual Activity   Alcohol use: Yes    Alcohol/week: 2.0 standard drinks of alcohol    Types: 2 Standard drinks or equivalent per week   Drug use: Not on file   Sexual activity: Not on file  Other Topics  Concern   Not on file  Social History Narrative   Not on file   Social Determinants of Health   Financial Resource Strain: Not on file  Food Insecurity: Not on file  Transportation Needs: Not on file  Physical Activity: Not on file  Stress: Not on file  Social Connections: Not on file     Family History: The patient's family history includes Diabetes in his mother; Heart disease in his maternal grandfather and mother; Leukemia in his brother; Lung cancer in his father; Stroke in his mother and paternal grandfather.  ROS:   Please see the history of present illness.     All other systems reviewed and are negative.  EKGs/Labs/Other Studies Reviewed:    The following studies were reviewed today:  Heart Monitor 04/13/2022 Patch Wear Time:  14 days and 0 hours (2023-08-25T18:47:21-0400 to 2023-09-08T18:47:25-0400)   Patient had a min HR of 22 bpm, max HR of 203 bpm, and avg HR of 66 bpm. Predominant underlying rhythm was Sinus Rhythm. First Degree AV Block was present. Bundle Branch Block/IVCD was present. 12 Ventricular Tachycardia runs occurred, the run with the  fastest interval lasting 4 beats with a max rate of 203 bpm, the longest lasting 4 beats with an avg rate of 121 bpm. 1 run of Supraventricular Tachycardia occurred lasting 4 beats with a max rate of 128 bpm (avg 111 bpm). Second Degree AV Block-Mobitz I  (Wenckebach) was present. Wenckebach was detected within +/- 45 seconds of symptomatic patient event(s). Isolated SVEs  were rare (<1.0%), SVE Couplets were rare (<1.0%), and no SVE Triplets were present. Isolated VEs were frequent (11.3%, [id number]), VE  Couplets were occasional (2.7%, 16428), and VE Triplets were rare (<1.0%, 45). Ventricular Bigeminy and Trigeminy were present.  EKG:  EKG is not ordered today.    Recent Labs: No results found for requested labs within last 365 days.  Recent Lipid Panel    Component Value Date/Time   CHOL 101 (L) 11/25/2015 1343   TRIG 170 (H) 11/25/2015 1343   HDL 42 11/25/2015 1343   CHOLHDL 2.4 11/25/2015 1343   VLDL 34 (H) 11/25/2015 1343   LDLCALC 25 11/25/2015 1343     Risk Assessment/Calculations:           Physical Exam:    VS:  BP 130/60   Pulse 63   Ht 5' 6.5" (1.689 m)   Wt 188 lb 12.8 oz (85.6 kg)   SpO2 96%   BMI 30.02 kg/m        Wt Readings from Last 3 Encounters:  04/27/22 188 lb 12.8 oz (85.6 kg)  03/16/22 189 lb (85.7 kg)  01/30/22 181 lb 12.8 oz (82.5 kg)     GEN:  Well nourished, well developed in no acute distress HEENT: Normal NECK: No JVD; No carotid bruits LYMPHATICS: No lymphadenopathy CARDIAC: RRR, no murmurs, rubs, gallops RESPIRATORY:  Clear to auscultation without rales, wheezing or rhonchi  ABDOMEN: Soft, non-tender, non-distended MUSCULOSKELETAL:  No edema; No deformity  SKIN: Warm and dry NEUROLOGIC:  Alert and oriented x 3 PSYCHIATRIC:  Normal affect   ASSESSMENT:    1. Bradycardia   2. Coronary artery disease involving coronary bypass graft of native heart without angina pectoris   3. Essential hypertension   4. Hyperlipidemia LDL goal <70   5. Controlled type 2 diabetes mellitus without complication, without long-term current use of insulin (HCC)   6. Stage 3 chronic kidney disease, unspecified whether stage 3a or 3b CKD ([employer])  PLAN:    In order of problems listed above:  Bradycardia: Largely asymptomatic.  He has some bradycardic episodes during the recent heart monitor.  Slowest heart rate was 22  bpm, this likely has occurred during sleep.  Patient denies any significant dizziness, blurred vision or feeling of passing out.  He also has underlying winky block heart block.  I discussed the recent heart monitor result with the patient and Dr. Claiborne Billings.  Given lack of symptom, he may not need a pacemaker now however he likely will continue to have worsening conduction disorder in the future and may eventually need a pacemaker.  Dr. Claiborne Billings recommended to refer the patient to EP service for follow-up  CAD s/p CABG: On aspirin and Lipitor.  He denies any recent chest pain.  Hypertension: Blood pressure stable  Hyperlipidemia: On Lipitor  DM2: Managed by primary care provider  CKD: Stable on recent last work.           Medication Adjustments/Labs and Tests Ordered: Current medicines are reviewed at length with the patient today.  Concerns regarding medicines are outlined above.  Orders Placed This Encounter  Procedures   Ambulatory referral to Cardiac Electrophysiology   No orders of the defined types were placed in this encounter.   Patient Instructions  Medication Instructions:  Your physician recommends that you continue on your current medications as directed. Please refer to the Current Medication list given to you today.  *If you need a refill on your cardiac medications before your next appointment, please call your pharmacy*  Lab Work: NONE ordered at this time of appointment   If you have labs (blood work) drawn today and your tests are completely normal, you will receive your results only by: [place] (if you have MyChart) OR A paper copy in the mail If you have any lab test that is abnormal or we need to change your treatment, we will call you to review the results.  Testing/Procedures: You have been referred to Cardiac Electrophysiology   Follow-Up: At [facility], you and your health needs are our priority.  As part of our continuing mission to  provide you with exceptional heart care, we have created designated Provider Care Teams.  These Care Teams include your primary Cardiologist (physician) and Advanced Practice Providers (APPs -  Physician Assistants and Nurse Practitioners) who all work together to provide you with the care you need, when you need it.   Your next appointment:   6 month(s)  The format for your next appointment:   In Person  Provider:   Shelva Majestic, MD     Other Instructions   Important Information About Sugar         Hilbert Corrigan, [place]  04/29/2022 10:43 PM    [place]

## 2022-04-29 ENCOUNTER — Encounter: Payer: Self-pay | Admitting: Physician Assistant

## 2022-05-20 ENCOUNTER — Emergency Department (HOSPITAL_BASED_OUTPATIENT_CLINIC_OR_DEPARTMENT_OTHER): Payer: Medicare Other | Admitting: Radiology

## 2022-05-20 ENCOUNTER — Inpatient Hospital Stay (HOSPITAL_BASED_OUTPATIENT_CLINIC_OR_DEPARTMENT_OTHER)
Admission: EM | Admit: 2022-05-20 | Discharge: 2022-05-25 | DRG: 194 | Disposition: A | Discharge status: Home / Self Care | Payer: Medicare Other | Attending: Internal Medicine | Admitting: Internal Medicine

## 2022-05-20 ENCOUNTER — Encounter (HOSPITAL_BASED_OUTPATIENT_CLINIC_OR_DEPARTMENT_OTHER): Payer: Self-pay | Admitting: Emergency Medicine

## 2022-05-20 ENCOUNTER — Encounter (HOSPITAL_COMMUNITY): Payer: Self-pay

## 2022-05-20 ENCOUNTER — Other Ambulatory Visit: Payer: Self-pay

## 2022-05-20 DIAGNOSIS — E785 Hyperlipidemia, unspecified: Secondary | ICD-10-CM | POA: Diagnosis present

## 2022-05-20 DIAGNOSIS — Z801 Family history of malignant neoplasm of trachea, bronchus and lung: Secondary | ICD-10-CM

## 2022-05-20 DIAGNOSIS — Z79899 Other long term (current) drug therapy: Secondary | ICD-10-CM

## 2022-05-20 DIAGNOSIS — I455 Other specified heart block: Secondary | ICD-10-CM | POA: Diagnosis present

## 2022-05-20 DIAGNOSIS — Z806 Family history of leukemia: Secondary | ICD-10-CM

## 2022-05-20 DIAGNOSIS — J09X2 Influenza due to identified novel influenza A virus with other respiratory manifestations: Secondary | ICD-10-CM | POA: Diagnosis present

## 2022-05-20 DIAGNOSIS — E861 Hypovolemia: Secondary | ICD-10-CM | POA: Diagnosis present

## 2022-05-20 DIAGNOSIS — M6282 Rhabdomyolysis: Secondary | ICD-10-CM | POA: Diagnosis present

## 2022-05-20 DIAGNOSIS — I10 Essential (primary) hypertension: Secondary | ICD-10-CM | POA: Diagnosis present

## 2022-05-20 DIAGNOSIS — E1122 Type 2 diabetes mellitus with diabetic chronic kidney disease: Secondary | ICD-10-CM | POA: Diagnosis present

## 2022-05-20 DIAGNOSIS — I5022 Chronic systolic (congestive) heart failure: Secondary | ICD-10-CM | POA: Diagnosis present

## 2022-05-20 DIAGNOSIS — J101 Influenza due to other identified influenza virus with other respiratory manifestations: Principal | ICD-10-CM | POA: Diagnosis present

## 2022-05-20 DIAGNOSIS — R001 Bradycardia, unspecified: Secondary | ICD-10-CM

## 2022-05-20 DIAGNOSIS — I2510 Atherosclerotic heart disease of native coronary artery without angina pectoris: Secondary | ICD-10-CM | POA: Diagnosis not present

## 2022-05-20 DIAGNOSIS — Z8249 Family history of ischemic heart disease and other diseases of the circulatory system: Secondary | ICD-10-CM

## 2022-05-20 DIAGNOSIS — Z7982 Long term (current) use of aspirin: Secondary | ICD-10-CM

## 2022-05-20 DIAGNOSIS — Z1152 Encounter for screening for COVID-19: Secondary | ICD-10-CM

## 2022-05-20 DIAGNOSIS — Z886 Allergy status to analgesic agent status: Secondary | ICD-10-CM

## 2022-05-20 DIAGNOSIS — Y92009 Unspecified place in unspecified non-institutional (private) residence as the place of occurrence of the external cause: Secondary | ICD-10-CM

## 2022-05-20 DIAGNOSIS — Z823 Family history of stroke: Secondary | ICD-10-CM

## 2022-05-20 DIAGNOSIS — R9431 Abnormal electrocardiogram [ECG] [EKG]: Secondary | ICD-10-CM | POA: Diagnosis present

## 2022-05-20 DIAGNOSIS — Z833 Family history of diabetes mellitus: Secondary | ICD-10-CM

## 2022-05-20 DIAGNOSIS — E119 Type 2 diabetes mellitus without complications: Secondary | ICD-10-CM

## 2022-05-20 DIAGNOSIS — I130 Hypertensive heart and chronic kidney disease with heart failure and stage 1 through stage 4 chronic kidney disease, or unspecified chronic kidney disease: Secondary | ICD-10-CM | POA: Diagnosis present

## 2022-05-20 DIAGNOSIS — Z85828 Personal history of other malignant neoplasm of skin: Secondary | ICD-10-CM

## 2022-05-20 DIAGNOSIS — Z7984 Long term (current) use of oral hypoglycemic drugs: Secondary | ICD-10-CM

## 2022-05-20 DIAGNOSIS — R5381 Other malaise: Secondary | ICD-10-CM

## 2022-05-20 DIAGNOSIS — Z602 Problems related to living alone: Secondary | ICD-10-CM | POA: Diagnosis present

## 2022-05-20 DIAGNOSIS — Z888 Allergy status to other drugs, medicaments and biological substances status: Secondary | ICD-10-CM

## 2022-05-20 DIAGNOSIS — N1832 Chronic kidney disease, stage 3b: Secondary | ICD-10-CM | POA: Diagnosis present

## 2022-05-20 DIAGNOSIS — W06XXXA Fall from bed, initial encounter: Secondary | ICD-10-CM | POA: Diagnosis present

## 2022-05-20 DIAGNOSIS — Z951 Presence of aortocoronary bypass graft: Secondary | ICD-10-CM

## 2022-05-20 DIAGNOSIS — I5020 Unspecified systolic (congestive) heart failure: Secondary | ICD-10-CM | POA: Diagnosis present

## 2022-05-20 DIAGNOSIS — Z885 Allergy status to narcotic agent status: Secondary | ICD-10-CM

## 2022-05-20 DIAGNOSIS — I4510 Unspecified right bundle-branch block: Secondary | ICD-10-CM | POA: Diagnosis present

## 2022-05-20 LAB — URINALYSIS, ROUTINE W REFLEX MICROSCOPIC
Bilirubin Urine: NEGATIVE
Glucose, UA: >1000 mg/dL — AB
Leukocytes,Ua: NEGATIVE
Nitrite: NEGATIVE
Protein, ur: >300 mg/dL — AB
Specific Gravity, Urine: 1.029 (ref 1.005–1.030)
pH: 6 (ref 5.0–8.0)

## 2022-05-20 LAB — BASIC METABOLIC PANEL
Anion gap: 13 (ref 5–15)
BUN: 26 mg/dL — ABNORMAL HIGH (ref 8–23)
CO2: 22 mmol/L (ref 22–32)
Calcium: 9.3 mg/dL (ref 8.9–10.3)
Chloride: 98 mmol/L (ref 98–111)
Creatinine, Ser: 1.81 mg/dL — ABNORMAL HIGH (ref 0.61–1.24)
GFR, Estimated: 36 mL/min — ABNORMAL LOW (ref >60)
Glucose, Bld: 148 mg/dL — ABNORMAL HIGH (ref 70–99)
Potassium: 4.2 mmol/L (ref 3.5–5.1)
Sodium: 133 mmol/L — ABNORMAL LOW (ref 135–145)

## 2022-05-20 LAB — CBC
HCT: 45.6 % (ref 39.0–52.0)
Hemoglobin: 15 g/dL (ref 13.0–17.0)
MCH: 32.1 pg (ref 26.0–34.0)
MCHC: 32.9 g/dL (ref 30.0–36.0)
MCV: 97.4 fL (ref 80.0–100.0)
Platelets: 162 10*3/uL (ref 150–400)
RBC: 4.68 MIL/uL (ref 4.22–5.81)
RDW: 15.8 % — ABNORMAL HIGH (ref 11.5–15.5)
WBC: 7.8 10*3/uL (ref 4.0–10.5)
nRBC: 0 % (ref 0.0–0.2)

## 2022-05-20 LAB — RESP PANEL BY RT-PCR (FLU A&B, COVID) ARPGX2
Influenza A by PCR: POSITIVE — AB
Influenza B by PCR: NEGATIVE
SARS Coronavirus 2 by RT PCR: NEGATIVE

## 2022-05-20 LAB — CK: Total CK: 3755 U/L — ABNORMAL HIGH (ref 49–397)

## 2022-05-20 MED ORDER — DOXYCYCLINE HYCLATE 100 MG PO TABS: 100.0000 mg | ORAL_TABLET | Freq: Once | ORAL | Status: DC

## 2022-05-20 MED ORDER — CEFDINIR 300 MG PO CAPS: 300.0000 mg | ORAL_CAPSULE | Freq: Once | ORAL | Status: DC

## 2022-05-20 MED ORDER — LACTATED RINGERS IV SOLN: INTRAVENOUS | Status: DC

## 2022-05-20 MED ORDER — LACTATED RINGERS IV BOLUS
500.0000 mL | Freq: Once | INTRAVENOUS | Status: AC
  Administered 2022-05-20: 500 mL via INTRAVENOUS

## 2022-05-20 MED ORDER — OSELTAMIVIR PHOSPHATE 30 MG PO CAPS
30.0000 mg | ORAL_CAPSULE | Freq: Two times a day (BID) | ORAL | Status: DC
  Administered 2022-05-20: 30 mg via ORAL
  Filled 2022-05-20 (×2): qty 1

## 2022-05-20 NOTE — ED Notes (Signed)
Family brought food in for pt.

## 2022-05-20 NOTE — ED Provider Notes (Signed)
[facility] EMERGENCY DEPT Provider Note   CSN: [id number] Arrival date & time: 05/20/22  1435     History  Chief Complaint  Patient presents with   Peter Mullen is a 88 y.o. male.  HPI 88 year old male presents with slip and fall and soreness.  Family is concerned he may be dehydrated.  He fell try to get out of bed to go the bathroom at around 5 AM.  He really just slipped out of the bed and landed on his buttocks.  However he was not able to get himself up.  He has been dealing with a URI with cough and congestion for a few days.  He did seem to injure his left arm though it is pretty mild in his upper arm.  He is sore from where he is been laying on the ground for 7+ hours.  Family did find him and was able to get him up and then he was brought here.  No urinary complaints.  Seem to have dark but yellow urine when he urinated today.  Home Medications Prior to Admission medications   Medication Sig Start Date End Date Taking? Authorizing Provider  acetaminophen (TYLENOL) 500 MG tablet Take 2 tablets by mouth at bedtime.    [provider]  allopurinol (ZYLOPRIM) 300 MG tablet Take 300 mg by mouth daily. 02/10/18   [provider]  Ascorbic Acid (VITAMIN C PO) Take by mouth 2 (two) times a week.    [provider]  aspirin 81 MG tablet Take 81 mg by mouth every evening.     [provider]  atorvastatin (LIPITOR) 40 MG tablet TAKE 1/2 (ONE-HALF) TABLET BY MOUTH ONCE DAILY AT  6PM 10/01/21   Troy Sine, MD  BIOTIN PO Take 10,000 mcg by mouth daily.    [provider]  BLACK ELDERBERRY PO Take 50 mg by mouth 3 (three) times a week.    [provider]  cetirizine (ZYRTEC) 10 MG tablet Take 10 mg by mouth at bedtime.    [provider]  Cholecalciferol (VITAMIN D3) 50 MCG (2000 UT) capsule Take 2,000 Units by mouth 3 (three) times a week.    [provider]  colchicine 0.6 MG tablet  Take 0.6 mg by mouth daily as needed (gout flares).    [provider]  Cyanocobalamin (VITAMIN B12 PO) Take 1,000 mg by mouth 3 (three) times a week.    [provider]  diclofenac Sodium (VOLTAREN) 1 % GEL Apply topically as needed.    [provider]  diphenhydrAMINE (BENADRYL) 25 MG tablet 2 tablets    [provider]  empagliflozin (JARDIANCE) 25 MG TABS tablet Take 12.5 mg by mouth daily. 02/05/20   [provider]  fluticasone (FLONASE) 50 MCG/ACT nasal spray Place 1 spray into both nostrils every evening.    [provider]  folic acid (FOLVITE) 629 MCG tablet Take 400 mcg by mouth 3 (three) times a week.    [provider]  furosemide (LASIX) 40 MG tablet Take 1 tablet by mouth once daily Patient taking differently: Take 40 mg by mouth every other day. 09/29/21   Troy Sine, MD  guaiFENesin (MUCINEX) 600 MG 12 hr tablet Take 2 tablets by mouth at bedtime.    [provider]  icosapent Ethyl (VASCEPA) 1 g capsule Take 2 capsules (2 g total) by mouth 2 (two) times daily. 04/17/20   Troy Sine, MD  Melatonin 10 MG TABS Take 10 mg by mouth at bedtime.    [provider]  metFORMIN (GLUCOPHAGE) 500 MG tablet Take 1 tablet by mouth 2 (two) times daily.    [provider]  metoprolol succinate (TOPROL-XL) 25 MG 24 hr tablet Take 1/2 (one-half) tablet by mouth once daily 09/29/21   Troy Sine, MD  montelukast (SINGULAIR) 10 MG tablet Take 10 mg by mouth at bedtime.  05/04/14   [provider]  Multiple Vitamins-Minerals (ZINC PO) Take 1 tablet by mouth 3 (three) times a week.    [provider]  nitroGLYCERIN (NITROSTAT) 0.4 MG SL tablet Place 1 tablet under the tongue daily as needed for chest pain. Patient not taking: Reported on 03/16/2022    [provider]  olmesartan (BENICAR) 20 MG tablet Take 1 tablet by mouth once daily 09/29/21   Troy Sine, MD  omeprazole  (PRILOSEC) 20 MG capsule Take 20 mg by mouth 2 (two) times daily before a meal.    [provider]  oxymetazoline (AFRIN) 0.05 % nasal spray Place 1 spray into both nostrils at bedtime.    [provider]  testosterone (ANDROGEL) 50 MG/5GM GEL Place 5 g onto the skin 2 (two) times daily.     [provider]  vitamin E 400 UNIT capsule Take 400 Units by mouth 3 (three) times a week.    [provider]      Allergies    Codeine, Cyclobenzaprine, and Nsaids    Review of Systems   Review of Systems  HENT:  Positive for congestion.   Respiratory:  Positive for cough.   Gastrointestinal:  Negative for abdominal pain.  Musculoskeletal:  Positive for myalgias (focal, left arm, no diffuse myalgias).    Physical Exam Updated Vital Signs BP 97/66 (BP Location: Right Arm)   Pulse 82   Temp 98.7 F (37.1 C)   Resp (!) 23   Ht '5\' 6"'$  (1.676 m)   Wt 81.6 kg   SpO2 96%   BMI 29.05 kg/m  Physical Exam Vitals and nursing note reviewed.  Constitutional:      General: He is not in acute distress.    Appearance: He is well-developed. He is not ill-appearing or diaphoretic.  HENT:     Head: Normocephalic and atraumatic.  Cardiovascular:     Rate and Rhythm: Normal rate. Rhythm irregular.     Heart sounds: Normal heart sounds.  Pulmonary:     Effort: Pulmonary effort is normal.  Abdominal:     General: There is no distension.     Palpations: Abdomen is soft.     Tenderness: There is no abdominal tenderness.  Musculoskeletal:     Comments: Mild soreness to left upper arm. No significant bruising Full ROM of hips  Skin:    General: Skin is warm and dry.  Neurological:     Mental Status: He is alert.     ED Results / Procedures / Treatments   Labs (all labs ordered are listed, but only abnormal results are displayed) Labs Reviewed  RESP PANEL BY RT-PCR (FLU A&B, COVID) ARPGX2 - Abnormal; Notable for the following components:      Result Value    Influenza A by PCR POSITIVE (*)    All other components within normal limits  BASIC METABOLIC PANEL - Abnormal; Notable for the following components:   Sodium 133 (*)    Glucose, Bld 148 (*)    BUN 26 (*)  Creatinine, Ser 1.81 (*)    GFR, Estimated 36 (*)    All other components within normal limits  CBC - Abnormal; Notable for the following components:   RDW 15.8 (*)    All other components within normal limits  URINALYSIS, ROUTINE W REFLEX MICROSCOPIC - Abnormal; Notable for the following components:   Glucose, UA >1,000 (*)    Hgb urine dipstick LARGE (*)    Ketones, ur TRACE (*)    Protein, ur >300 (*)    All other components within normal limits  CK - Abnormal; Notable for the following components:   Total CK 3,755 (*)    All other components within normal limits  CBG MONITORING, ED    EKG EKG Interpretation  Date/Time:  Wednesday May 20 2022 16:25:08 EDT Ventricular Rate:  93 PR Interval:  232 QRS Duration: 146 QT Interval:  444 QTC Calculation: 552 R Axis:   -74 Text Interpretation: Sinus rhythm with 1st degree A-V block with frequent Premature ventricular complexes Left axis deviation Right bundle branch block Minimal voltage criteria for LVH, may be normal variant ( R in aVL ) Anterior infarct , age undetermined Confirmed by Sherwood Gambler [phone]) on 05/20/2022 7:57:54 PM  Radiology DG Chest 2 View  Result Date: 05/20/2022 CLINICAL DATA:  Cough and congestion. EXAM: CHEST - 2 VIEW COMPARISON:  No relevant comparison studies are available. FINDINGS: The heart size is at the upper limits of normal status post median sternotomy and CABG. There is aortic atherosclerosis. The lungs appear clear. There is no pleural effusion or pneumothorax. Mild degenerative changes throughout the spine without evidence of acute osseous abnormality. Telemetry leads overlie the chest. IMPRESSION: No evidence of acute cardiopulmonary process. Borderline heart size status post CABG.  Electronically Signed   By: Richardean Sale M.D.   On: 05/20/2022 18:54   DG Humerus Left  Result Date: 05/20/2022 CLINICAL DATA:  Injury EXAM: LEFT HUMERUS - 2+ VIEW COMPARISON:  None Available. FINDINGS: No fracture or dislocation of the humerus. Shoulder joint appears intact. IMPRESSION: No fracture or dislocation. Electronically Signed   By: Suzy Bouchard M.D.   On: 05/20/2022 16:48    Procedures Procedures    Medications Ordered in ED Medications  lactated ringers infusion (has no administration in time range)  oseltamivir (TAMIFLU) capsule 30 mg (30 mg Oral Given 05/20/22 1934)  lactated ringers bolus 500 mL (500 mLs Intravenous New Bag/Given 05/20/22 1902)    ED Course/ Medical Decision Making/ A&P                           Medical Decision Making Amount and/or Complexity of Data Reviewed Labs: ordered.    Details: Influenza A is positive UA with large hemoglobin Kidney function at baseline Radiology: ordered and independent interpretation performed.    Details: Chest x-ray without pneumonia ECG/medicine tests: independent interpretation performed.    Details: Sign this rhythm with PVCs  Risk Prescription drug management. Decision regarding hospitalization.   Patient's URI symptoms and generalized weakness is probably from the influenza A.  Had a discussion about but she will risks/potential benefits and he and family have decided to go forward with Tamiflu.  He is right on the 48-hour mark of symptom onset according to family.  No pneumonia.  He does have a CK of 3800 and given his age, along with myoglobinuria on the UA, I am concerned about rhabdomyolysis.  He will be started on IV fluids.  Chart review  shows he has a history of CHF so we will do gentle hydration and admit for observation.  Discussed with Dr. Marlowe Sax.        Final Clinical Impression(s) / ED Diagnoses Final diagnoses:  Non-traumatic rhabdomyolysis  Influenza A    Rx / DC Orders ED  Discharge Orders     None         Sherwood Gambler, MD 05/20/22 2000

## 2022-05-20 NOTE — ED Triage Notes (Signed)
Pt via pov from home after slipping off the side of the bed and falling onto the floor this morning; Pt could not get up and stayed on the floor for several hours. Pt denies any injury from the fall, but he reports that his left arm is a bit sore. He believes he may have stretched the arm trying to get up. He also reports some abdominal pain that "feels like a strain." Denies nausea, vomiting; endorses some loose stool. Denies loc or hitting his head. EMS thought he might be dehydrated after 8 hours with nothing to drink. Pt alert & oriented, nad noted. Pt's goal of care is to determine if he is or was dehydrated. Pt lives alone a few blocks from his family members.

## 2022-05-20 NOTE — ED Notes (Signed)
Pt slipped out of bed this am and laid there several hrs. Pt c/o left arm pain, possibly from stretching it.  Denies any N/V/D states he feels dehydrated.   Family at bedside

## 2022-05-20 NOTE — Plan of Care (Signed)
[facility] will assume care on arrival to accepting facility. Until arrival, care as per EDP. However, [facility] available 24/7 for questions and assistance.  Nursing staff, please page [facility] Admits and Consults ([phone]) as soon as the patient arrives to the hospital.   

## 2022-05-20 NOTE — ED Notes (Signed)
Patient transported to X-ray 

## 2022-05-21 ENCOUNTER — Encounter (HOSPITAL_COMMUNITY): Payer: Self-pay | Admitting: Internal Medicine

## 2022-05-21 DIAGNOSIS — Z823 Family history of stroke: Secondary | ICD-10-CM | POA: Diagnosis not present

## 2022-05-21 DIAGNOSIS — M6282 Rhabdomyolysis: Secondary | ICD-10-CM | POA: Diagnosis present

## 2022-05-21 DIAGNOSIS — I130 Hypertensive heart and chronic kidney disease with heart failure and stage 1 through stage 4 chronic kidney disease, or unspecified chronic kidney disease: Secondary | ICD-10-CM | POA: Diagnosis present

## 2022-05-21 DIAGNOSIS — Z801 Family history of malignant neoplasm of trachea, bronchus and lung: Secondary | ICD-10-CM | POA: Diagnosis not present

## 2022-05-21 DIAGNOSIS — Z886 Allergy status to analgesic agent status: Secondary | ICD-10-CM | POA: Diagnosis not present

## 2022-05-21 DIAGNOSIS — Z7984 Long term (current) use of oral hypoglycemic drugs: Secondary | ICD-10-CM | POA: Diagnosis not present

## 2022-05-21 DIAGNOSIS — Z951 Presence of aortocoronary bypass graft: Secondary | ICD-10-CM | POA: Diagnosis not present

## 2022-05-21 DIAGNOSIS — I5022 Chronic systolic (congestive) heart failure: Secondary | ICD-10-CM | POA: Diagnosis present

## 2022-05-21 DIAGNOSIS — E861 Hypovolemia: Secondary | ICD-10-CM | POA: Diagnosis present

## 2022-05-21 DIAGNOSIS — Y92009 Unspecified place in unspecified non-institutional (private) residence as the place of occurrence of the external cause: Secondary | ICD-10-CM | POA: Diagnosis not present

## 2022-05-21 DIAGNOSIS — R5381 Other malaise: Secondary | ICD-10-CM

## 2022-05-21 DIAGNOSIS — R001 Bradycardia, unspecified: Secondary | ICD-10-CM

## 2022-05-21 DIAGNOSIS — Z8249 Family history of ischemic heart disease and other diseases of the circulatory system: Secondary | ICD-10-CM | POA: Diagnosis not present

## 2022-05-21 DIAGNOSIS — R9431 Abnormal electrocardiogram [ECG] [EKG]: Secondary | ICD-10-CM | POA: Diagnosis present

## 2022-05-21 DIAGNOSIS — I4510 Unspecified right bundle-branch block: Secondary | ICD-10-CM | POA: Diagnosis present

## 2022-05-21 DIAGNOSIS — Z602 Problems related to living alone: Secondary | ICD-10-CM | POA: Diagnosis present

## 2022-05-21 DIAGNOSIS — J101 Influenza due to other identified influenza virus with other respiratory manifestations: Secondary | ICD-10-CM | POA: Diagnosis present

## 2022-05-21 DIAGNOSIS — W06XXXA Fall from bed, initial encounter: Secondary | ICD-10-CM | POA: Diagnosis present

## 2022-05-21 DIAGNOSIS — E785 Hyperlipidemia, unspecified: Secondary | ICD-10-CM | POA: Diagnosis present

## 2022-05-21 DIAGNOSIS — N1832 Chronic kidney disease, stage 3b: Secondary | ICD-10-CM | POA: Diagnosis present

## 2022-05-21 DIAGNOSIS — J09X2 Influenza due to identified novel influenza A virus with other respiratory manifestations: Secondary | ICD-10-CM | POA: Diagnosis not present

## 2022-05-21 DIAGNOSIS — Z833 Family history of diabetes mellitus: Secondary | ICD-10-CM | POA: Diagnosis not present

## 2022-05-21 DIAGNOSIS — E1122 Type 2 diabetes mellitus with diabetic chronic kidney disease: Secondary | ICD-10-CM | POA: Diagnosis present

## 2022-05-21 DIAGNOSIS — I2510 Atherosclerotic heart disease of native coronary artery without angina pectoris: Secondary | ICD-10-CM | POA: Diagnosis present

## 2022-05-21 DIAGNOSIS — I455 Other specified heart block: Secondary | ICD-10-CM | POA: Diagnosis present

## 2022-05-21 DIAGNOSIS — Z7982 Long term (current) use of aspirin: Secondary | ICD-10-CM | POA: Diagnosis not present

## 2022-05-21 DIAGNOSIS — Z806 Family history of leukemia: Secondary | ICD-10-CM | POA: Diagnosis not present

## 2022-05-21 DIAGNOSIS — Z79899 Other long term (current) drug therapy: Secondary | ICD-10-CM | POA: Diagnosis not present

## 2022-05-21 DIAGNOSIS — Z85828 Personal history of other malignant neoplasm of skin: Secondary | ICD-10-CM | POA: Diagnosis not present

## 2022-05-21 DIAGNOSIS — Z1152 Encounter for screening for COVID-19: Secondary | ICD-10-CM | POA: Diagnosis not present

## 2022-05-21 LAB — BASIC METABOLIC PANEL
Anion gap: 9 (ref 5–15)
BUN: 30 mg/dL — ABNORMAL HIGH (ref 8–23)
CO2: 23 mmol/L (ref 22–32)
Calcium: 8.5 mg/dL — ABNORMAL LOW (ref 8.9–10.3)
Chloride: 105 mmol/L (ref 98–111)
Creatinine, Ser: 1.66 mg/dL — ABNORMAL HIGH (ref 0.61–1.24)
GFR, Estimated: 39 mL/min — ABNORMAL LOW (ref >60)
Glucose, Bld: 127 mg/dL — ABNORMAL HIGH (ref 70–99)
Potassium: 3.7 mmol/L (ref 3.5–5.1)
Sodium: 137 mmol/L (ref 135–145)

## 2022-05-21 LAB — MAGNESIUM: Magnesium: 1.7 mg/dL (ref 1.7–2.4)

## 2022-05-21 LAB — CBC
HCT: 42.8 % (ref 39.0–52.0)
Hemoglobin: 14.3 g/dL (ref 13.0–17.0)
MCH: 32.2 pg (ref 26.0–34.0)
MCHC: 33.4 g/dL (ref 30.0–36.0)
MCV: 96.4 fL (ref 80.0–100.0)
Platelets: 152 10*3/uL (ref 150–400)
RBC: 4.44 MIL/uL (ref 4.22–5.81)
RDW: 15.5 % (ref 11.5–15.5)
WBC: 6.3 10*3/uL (ref 4.0–10.5)
nRBC: 0 % (ref 0.0–0.2)

## 2022-05-21 LAB — GLUCOSE, CAPILLARY
Glucose-Capillary: 109 mg/dL — ABNORMAL HIGH (ref 70–99)
Glucose-Capillary: 161 mg/dL — ABNORMAL HIGH (ref 70–99)
Glucose-Capillary: 116 mg/dL — ABNORMAL HIGH (ref 70–99)
Glucose-Capillary: 156 mg/dL — ABNORMAL HIGH (ref 70–99)

## 2022-05-21 LAB — PHOSPHORUS: Phosphorus: 3.4 mg/dL (ref 2.5–4.6)

## 2022-05-21 LAB — CK: Total CK: 8272 U/L — ABNORMAL HIGH (ref 49–397)

## 2022-05-21 LAB — HEMOGLOBIN A1C
Hgb A1c MFr Bld: 6.8 % — ABNORMAL HIGH (ref 4.8–5.6)
Mean Plasma Glucose: 148.46 mg/dL

## 2022-05-21 MED ORDER — ASPIRIN 81 MG PO TBEC
81.0000 mg | DELAYED_RELEASE_TABLET | Freq: Every evening | ORAL | Status: DC
  Administered 2022-05-21 – 2022-05-24 (×4): 81 mg via ORAL
  Filled 2022-05-21 (×4): qty 1

## 2022-05-21 MED ORDER — SODIUM CHLORIDE 0.9% FLUSH
3.0000 mL | Freq: Two times a day (BID) | INTRAVENOUS | Status: DC
  Administered 2022-05-21 – 2022-05-23 (×3): 3 mL via INTRAVENOUS

## 2022-05-21 MED ORDER — INSULIN ASPART 100 UNIT/ML IJ SOLN
0.0000 [IU] | Freq: Three times a day (TID) | INTRAMUSCULAR | Status: DC
  Administered 2022-05-21 – 2022-05-24 (×4): 1 [IU] via SUBCUTANEOUS

## 2022-05-21 MED ORDER — MONTELUKAST SODIUM 10 MG PO TABS
10.0000 mg | ORAL_TABLET | Freq: Every day | ORAL | Status: DC
  Administered 2022-05-21 – 2022-05-24 (×4): 10 mg via ORAL
  Filled 2022-05-21 (×4): qty 1

## 2022-05-21 MED ORDER — ICOSAPENT ETHYL 1 G PO CAPS
2.0000 g | ORAL_CAPSULE | Freq: Two times a day (BID) | ORAL | Status: DC
  Administered 2022-05-21 – 2022-05-25 (×9): 2 g via ORAL
  Filled 2022-05-21 (×9): qty 2

## 2022-05-21 MED ORDER — OSELTAMIVIR PHOSPHATE 30 MG PO CAPS
30.0000 mg | ORAL_CAPSULE | Freq: Two times a day (BID) | ORAL | Status: AC
  Administered 2022-05-21 – 2022-05-25 (×9): 30 mg via ORAL
  Filled 2022-05-21 (×9): qty 1

## 2022-05-21 MED ORDER — PANTOPRAZOLE SODIUM 40 MG PO TBEC
40.0000 mg | DELAYED_RELEASE_TABLET | Freq: Every day | ORAL | Status: DC
  Administered 2022-05-21 – 2022-05-25 (×5): 40 mg via ORAL
  Filled 2022-05-21 (×5): qty 1

## 2022-05-21 MED ORDER — SENNOSIDES-DOCUSATE SODIUM 8.6-50 MG PO TABS: 1.0000 | ORAL_TABLET | Freq: Every evening | ORAL | Status: DC | PRN

## 2022-05-21 MED ORDER — OSELTAMIVIR PHOSPHATE 30 MG PO CAPS
30.0000 mg | ORAL_CAPSULE | Freq: Every day | ORAL | Status: DC
  Filled 2022-05-21: qty 1

## 2022-05-21 MED ORDER — HEPARIN SODIUM (PORCINE) 5000 UNIT/ML IJ SOLN
5000.0000 [IU] | Freq: Three times a day (TID) | INTRAMUSCULAR | Status: DC
  Administered 2022-05-21 – 2022-05-25 (×13): 5000 [IU] via SUBCUTANEOUS
  Filled 2022-05-21 (×13): qty 1

## 2022-05-21 MED ORDER — LACTATED RINGERS IV SOLN: INTRAVENOUS | Status: DC

## 2022-05-21 MED ORDER — ATORVASTATIN CALCIUM 20 MG PO TABS
20.0000 mg | ORAL_TABLET | Freq: Every day | ORAL | Status: DC
  Administered 2022-05-21 – 2022-05-22 (×2): 20 mg via ORAL
  Filled 2022-05-21 (×2): qty 1

## 2022-05-21 MED ORDER — ACETAMINOPHEN 650 MG RE SUPP: 650.0000 mg | Freq: Four times a day (QID) | RECTAL | Status: DC | PRN

## 2022-05-21 MED ORDER — LORATADINE 10 MG PO TABS
10.0000 mg | ORAL_TABLET | Freq: Every day | ORAL | Status: DC
  Administered 2022-05-21 – 2022-05-25 (×5): 10 mg via ORAL
  Filled 2022-05-21 (×5): qty 1

## 2022-05-21 MED ORDER — ACETAMINOPHEN 325 MG PO TABS: 650.0000 mg | ORAL_TABLET | Freq: Four times a day (QID) | ORAL | Status: DC | PRN

## 2022-05-21 MED ORDER — METOPROLOL SUCCINATE ER 25 MG PO TB24
12.5000 mg | ORAL_TABLET | ORAL | Status: DC
  Administered 2022-05-22 – 2022-05-25 (×2): 12.5 mg via ORAL
  Filled 2022-05-21 (×2): qty 1

## 2022-05-21 MED ORDER — INSULIN ASPART 100 UNIT/ML IJ SOLN: 0.0000 [IU] | Freq: Every day | INTRAMUSCULAR | Status: DC

## 2022-05-21 MED ORDER — ALLOPURINOL 300 MG PO TABS
300.0000 mg | ORAL_TABLET | Freq: Every day | ORAL | Status: DC
  Administered 2022-05-21 – 2022-05-25 (×5): 300 mg via ORAL
  Filled 2022-05-21 (×5): qty 1

## 2022-05-21 NOTE — Assessment & Plan Note (Addendum)
-  patient has history of CKD3b. Baseline creat ~ 1.4 - 1.6, eGFR 40 -Currently at baseline - Does have some risk for pigment nephropathy from rhabdo if CK continues to uptrend -Renal function showed continual improvement while on fluids

## 2022-05-21 NOTE — Assessment & Plan Note (Addendum)
-   Continue aspirin, statin - Continue Toprol -Resume Jardiance and ARB at discharge

## 2022-05-21 NOTE — Progress Notes (Signed)
  Transition of Care [facility]) Screening Note   Patient Details  Name: Peter Mullen Date of Birth: [birth year]   Transition of Care [facility]) CM/SW Contact:    Dessa Phi, RN Phone Number: 05/21/2022, 3:47 PM    Transition of Care Department [facility]) has reviewed patient and no TOC needs have been identified at this time. We will continue to monitor patient advancement through interdisciplinary progression rounds. If new patient transition needs arise, please place a TOC consult.

## 2022-05-21 NOTE — Assessment & Plan Note (Signed)
-   QTc 552 on EKG -Caution with prolonging agents

## 2022-05-21 NOTE — Assessment & Plan Note (Addendum)
-   resume home regimen 

## 2022-05-21 NOTE — Assessment & Plan Note (Signed)
-  Continue aspirin, Lipitor 

## 2022-05-21 NOTE — ED Notes (Signed)
Pt, belongings, paperwork transferred to [facility] at this time via [facility], receiving RN made aware.

## 2022-05-21 NOTE — ED Notes (Signed)
Requested patient change into gown, states he would rather stay in clothes. Patient ambulatory with walker and with minor assistance to bathroom.

## 2022-05-21 NOTE — Assessment & Plan Note (Signed)
Continue current regimen

## 2022-05-21 NOTE — H&P (Signed)
History and Physical    Peter Mullen NGE:[id number] DOB: 1932/02/11 DOA: 05/20/2022  PCP: Haywood Pao, MD   Patient coming from: Home   Chief Complaint: Cough, congestion, aches, fell and was too weak to get up   HPI: Peter Mullen is a pleasant 88 y.o. male with medical history significant for CAD status post CABG, hypertension, hyperlipidemia, type 2 diabetes mellitus, and CKD 3B, now presenting to the emergency department after he slid to the floor and was too weak to get up.  Patient reports that he developed a cough and congestion on 05/18/2022 and then aches with general malaise.  He tried to get up to the bathroom early this morning but states that his feet slid out from under him while he was getting out of bed and he was too generally weak to get up from the floor.  His son came to check on him around noon.  Patient believes he was on the floor for close to 8 hours.  He has some upper extremity and abdominal muscle soreness that he attributes to trying to pull himself up from the floor.  Denies hitting his head or losing consciousness.  Reports that he was vaccinated against influenza on May 01, 2022.  [facility] ED Course: Upon arrival to the ED, patient is found to be afebrile and saturating mid 90s on room air with systolic blood pressure of 97 and greater.  EKG features sinus rhythm with first-degree AV nodal block, PVCs, LAD, RBBB, and QTc 552.  Chest x-ray is negative for acute cardiopulmonary disease and her radiographs of the left humerus are negative for fracture or dislocation.  Chemistry panel notable for sodium 133 and creatinine 1.81.  CBC is unremarkable.  Serum CK is elevated to 3755.  Influenza A PCR is positive.  Patient was given IV fluids and Tamiflu in the ED.  He was transferred to [facility] for admission.  Review of Systems:  All other systems reviewed and apart from HPI, are negative.  Past Medical History:  Diagnosis Date    Coronary artery disease    Diabetes mellitus without complication ([place])    Hyperlipidemia    Hypertension    Skin cancer    Stomach problems     Past Surgical History:  Procedure Laterality Date   CORONARY ARTERY BYPASS GRAFT  2001   x4 LIMA-LAD, SVG-PDA   PROSTATE SURGERY  1992   TONSILLECTOMY  1940   TUMOR REMOVAL  1970   fatty tissue tumor   VASECTOMY  1975    Social History:   reports that he has never smoked. He has never used smokeless tobacco. He reports that he does not currently use alcohol after a past usage of about 2.0 standard drinks of alcohol per week. He reports that he does not currently use drugs.  Allergies  Allergen Reactions   Codeine Other (See Comments)    Unspecified    Cyclobenzaprine     Other reaction(s): palpitations   Nsaids Other (See Comments)    Unspecified     Family History  Problem Relation Age of Onset   Diabetes Mother    Heart disease Mother    Stroke Mother    Lung cancer Father    Heart disease Maternal Grandfather    Leukemia Brother    Stroke Paternal Grandfather      Prior to Admission medications   Medication Sig Start Date End Date Taking? Authorizing Provider  acetaminophen (TYLENOL) 500 MG tablet Take  2 tablets by mouth at bedtime.    [provider]  allopurinol (ZYLOPRIM) 300 MG tablet Take 300 mg by mouth daily. 02/10/18   [provider]  Ascorbic Acid (VITAMIN C PO) Take by mouth 2 (two) times a week.    [provider]  aspirin 81 MG tablet Take 81 mg by mouth every evening.     [provider]  atorvastatin (LIPITOR) 40 MG tablet TAKE 1/2 (ONE-HALF) TABLET BY MOUTH ONCE DAILY AT  6PM 10/01/21   Troy Sine, MD  BIOTIN PO Take 10,000 mcg by mouth daily.    [provider]  BLACK ELDERBERRY PO Take 50 mg by mouth 3 (three) times a week.    [provider]  cetirizine (ZYRTEC) 10 MG tablet Take 10 mg by mouth at bedtime.    [provider]   Cholecalciferol (VITAMIN D3) 50 MCG (2000 UT) capsule Take 2,000 Units by mouth 3 (three) times a week.    [provider]  colchicine 0.6 MG tablet Take 0.6 mg by mouth daily as needed (gout flares).    [provider]  Cyanocobalamin (VITAMIN B12 PO) Take 1,000 mg by mouth 3 (three) times a week.    [provider]  diclofenac Sodium (VOLTAREN) 1 % GEL Apply topically as needed.    [provider]  diphenhydrAMINE (BENADRYL) 25 MG tablet 2 tablets    [provider]  empagliflozin (JARDIANCE) 25 MG TABS tablet Take 12.5 mg by mouth daily. 02/05/20   [provider]  fluticasone (FLONASE) 50 MCG/ACT nasal spray Place 1 spray into both nostrils every evening.    [provider]  folic acid (FOLVITE) 389 MCG tablet Take 400 mcg by mouth 3 (three) times a week.    [provider]  furosemide (LASIX) 40 MG tablet Take 1 tablet by mouth once daily Patient taking differently: Take 40 mg by mouth every other day. 09/29/21   Troy Sine, MD  guaiFENesin (MUCINEX) 600 MG 12 hr tablet Take 2 tablets by mouth at bedtime.    [provider]  icosapent Ethyl (VASCEPA) 1 g capsule Take 2 capsules (2 g total) by mouth 2 (two) times daily. 04/17/20   Troy Sine, MD  Melatonin 10 MG TABS Take 10 mg by mouth at bedtime.    [provider]  metFORMIN (GLUCOPHAGE) 500 MG tablet Take 1 tablet by mouth 2 (two) times daily.    [provider]  metoprolol succinate (TOPROL-XL) 25 MG 24 hr tablet Take 1/2 (one-half) tablet by mouth once daily 09/29/21   Troy Sine, MD  montelukast (SINGULAIR) 10 MG tablet Take 10 mg by mouth at bedtime.  05/04/14   [provider]  Multiple Vitamins-Minerals (ZINC PO) Take 1 tablet by mouth 3 (three) times a week.    [provider]  nitroGLYCERIN (NITROSTAT) 0.4 MG SL tablet Place 1 tablet under the tongue daily as needed for chest pain. Patient not taking:  Reported on 03/16/2022    [provider]  olmesartan (BENICAR) 20 MG tablet Take 1 tablet by mouth once daily 09/29/21   Troy Sine, MD  omeprazole (PRILOSEC) 20 MG capsule Take 20 mg by mouth 2 (two) times daily before a meal.    [provider]  oxymetazoline (AFRIN) 0.05 % nasal spray Place 1 spray into both nostrils at bedtime.    [provider]  testosterone (ANDROGEL) 50 MG/5GM GEL Place 5 g onto the skin  2 (two) times daily.     [provider]  vitamin E 400 UNIT capsule Take 400 Units by mouth 3 (three) times a week.    [provider]    Physical Exam: Vitals:   05/21/22 0005 05/21/22 0100 05/21/22 0200 05/21/22 0339  BP: 106/60 107/60 109/70 (!) 120/54  Pulse: 67 (!) 58 (!) 56 79  Resp: (!) 26 (!) 27 (!) 23   Temp: 98.5 F (36.9 C)   99.1 F (37.3 C)  TempSrc:    Oral  SpO2: 93% 94% 92% 97%  Weight:      Height:        Constitutional: NAD, calm  Eyes: PERTLA, lids and conjunctivae normal ENMT: Mucous membranes are dry. Posterior pharynx clear of any exudate or lesions.   Neck: supple, no masses  Respiratory: no wheezing, no crackles. No accessory muscle use.  Cardiovascular: S1 & S2 heard, regular rate and rhythm. No extremity edema.   Abdomen: No distension, no tenderness, soft. Bowel sounds active.  Musculoskeletal: no clubbing / cyanosis. No joint deformity upper and lower extremities.   Skin: no significant rashes, lesions, ulcers. Warm, dry, well-perfused. Neurologic: CN 2-12 grossly intact. Moving all extremities. Alert and oriented.  Psychiatric: Pleasant. Cooperative.    Labs and Imaging on Admission: I have personally reviewed following labs and imaging studies  CBC: Recent Labs  Lab 05/20/22 1557  WBC 7.8  HGB 15.0  HCT 45.6  MCV 97.4  PLT 774   Basic Metabolic Panel: Recent Labs  Lab 05/20/22 1557  NA 133*  K 4.2  CL 98  CO2 22  GLUCOSE 148*  BUN 26*  CREATININE 1.81*  CALCIUM 9.3    GFR: Estimated Creatinine Clearance: 28.3 mL/min (A) (by C-G formula based on SCr of 1.81 mg/dL (H)). Liver Function Tests: No results for input(s): "AST", "ALT", "ALKPHOS", "BILITOT", "PROT", "ALBUMIN" in the last 168 hours. No results for input(s): "LIPASE", "AMYLASE" in the last 168 hours. No results for input(s): "AMMONIA" in the last 168 hours. Coagulation Profile: No results for input(s): "INR", "PROTIME" in the last 168 hours. Cardiac Enzymes: Recent Labs  Lab 05/20/22 1557  CKTOTAL 3,755*   BNP (last 3 results) No results for input(s): "PROBNP" in the last 8760 hours. HbA1C: No results for input(s): "HGBA1C" in the last 72 hours. CBG: No results for input(s): "GLUCAP" in the last 168 hours. Lipid Profile: No results for input(s): "CHOL", "HDL", "LDLCALC", "TRIG", "CHOLHDL", "LDLDIRECT" in the last 72 hours. Thyroid Function Tests: No results for input(s): "TSH", "T4TOTAL", "FREET4", "T3FREE", "THYROIDAB" in the last 72 hours. Anemia Panel: No results for input(s): "VITAMINB12", "FOLATE", "FERRITIN", "TIBC", "IRON", "RETICCTPCT" in the last 72 hours. Urine analysis:    Component Value Date/Time   COLORURINE YELLOW 05/20/2022 [place] 05/20/2022 1615   LABSPEC 1.029 05/20/2022 1615   PHURINE 6.0 05/20/2022 1615   GLUCOSEU >1,000 (A) 05/20/2022 1615   HGBUR LARGE (A) 05/20/2022 1615   BILIRUBINUR NEGATIVE 05/20/2022 1615   KETONESUR TRACE (A) 05/20/2022 1615   PROTEINUR >300 (A) 05/20/2022 1615   UROBILINOGEN 0.2 10/28/2013 1921   NITRITE NEGATIVE 05/20/2022 1615   LEUKOCYTESUR NEGATIVE 05/20/2022 1615   Sepsis Labs: '@LABRCNTIP'$ (procalcitonin:4,lacticidven:4) ) Recent Results (from the past 240 hour(s))  Resp Panel by RT-PCR (Flu A&B, Covid) Anterior Nasal Swab     Status: Abnormal   Collection Time: 05/20/22  6:31 PM   Specimen: Anterior Nasal Swab  Result Value Ref Range Status   SARS Coronavirus 2  by RT PCR NEGATIVE NEGATIVE Final     Comment: (NOTE) SARS-CoV-2 target nucleic acids are NOT DETECTED.  The SARS-CoV-2 RNA is generally detectable in upper respiratory specimens during the acute phase of infection. The lowest concentration of SARS-CoV-2 viral copies this assay can detect is 138 copies/mL. A negative result does not preclude SARS-Cov-2 infection and should not be used as the sole basis for treatment or other patient management decisions. A negative result may occur with  improper specimen collection/handling, submission of specimen other than nasopharyngeal swab, presence of viral mutation(s) within the areas targeted by this assay, and inadequate number of viral copies(<138 copies/mL). A negative result must be combined with clinical observations, patient history, and epidemiological information. The expected result is Negative.  Fact Sheet for Patients:  EntrepreneurPulse.com.au  Fact Sheet for Healthcare Providers:  IncredibleEmployment.be  This test is no t yet approved or cleared by the [place] FDA and  has been authorized for detection and/or diagnosis of SARS-CoV-2 by FDA under an Emergency Use Authorization (EUA). This EUA will remain  in effect (meaning this test can be used) for the duration of the COVID-19 declaration under Section 564(b)(1) of the Act, 21 U.S.C.section 360bbb-3(b)(1), unless the authorization is terminated  or revoked sooner.       Influenza A by PCR POSITIVE (A) NEGATIVE Final   Influenza B by PCR NEGATIVE NEGATIVE Final    Comment: (NOTE) The Xpert Xpress SARS-CoV-2/FLU/RSV plus assay is intended as an aid in the diagnosis of influenza from Nasopharyngeal swab specimens and should not be used as a sole basis for treatment. Nasal washings and aspirates are unacceptable for Xpert Xpress SARS-CoV-2/FLU/RSV testing.  Fact Sheet for Patients: EntrepreneurPulse.com.au  Fact Sheet for Healthcare  Providers: IncredibleEmployment.be  This test is not yet approved or cleared by the [place] FDA and has been authorized for detection and/or diagnosis of SARS-CoV-2 by FDA under an Emergency Use Authorization (EUA). This EUA will remain in effect (meaning this test can be used) for the duration of the COVID-19 declaration under Section 564(b)(1) of the Act, 21 U.S.C. section 360bbb-3(b)(1), unless the authorization is terminated or revoked.  Performed at [facility], [address], [place], [place] [address]      Radiological Exams on Admission: DG Chest 2 View  Result Date: 05/20/2022 CLINICAL DATA:  Cough and congestion. EXAM: CHEST - 2 VIEW COMPARISON:  No relevant comparison studies are available. FINDINGS: The heart size is at the upper limits of normal status post median sternotomy and CABG. There is aortic atherosclerosis. The lungs appear clear. There is no pleural effusion or pneumothorax. Mild degenerative changes throughout the spine without evidence of acute osseous abnormality. Telemetry leads overlie the chest. IMPRESSION: No evidence of acute cardiopulmonary process. Borderline heart size status post CABG. Electronically Signed   By: Richardean Sale M.D.   On: 05/20/2022 18:54   DG Humerus Left  Result Date: 05/20/2022 CLINICAL DATA:  Injury EXAM: LEFT HUMERUS - 2+ VIEW COMPARISON:  None Available. FINDINGS: No fracture or dislocation of the humerus. Shoulder joint appears intact. IMPRESSION: No fracture or dislocation. Electronically Signed   By: Suzy Bouchard M.D.   On: 05/20/2022 16:48    EKG: Independently reviewed. SR, 1st deg AV block, PVCs, LAD, RBBB, QTc 552.   Assessment/Plan   1. Influenza A  - Developed cough 10/23, then aches and malaise  - Received influenza vaccine October 6th  - Started on Tamiflu in ED  - Continue Tamiflu, supportive care, droplet  precautions    2. Rhabdomyolysis  - CK 3755 on  admission, renal function at baseline  - Secondary to laying on floor for hours and/or influenza  - Continue IVF hydration, monitor electrolytes and renal function    3. CAD - No anginal complaints  - Continue Lipitor and ASA    4. CKD IIIb  - SCr is 1.81 on admission, appears close to baseline  - Renally-dose medications, monitor    5. Type II DM  - Check CBGs and use low-intensity SSI for now    6. Chronic HFmrEF  - Hypovolemic on admission  - Hold Lasix, continue cautious IVF hydration, monitor volume status    7. Bradycardia  - Scheduled to see EP on 05/26/22   8. HTN  - SBP in 90s in ED, will hold antihypertensives initially   9. Prolonged QT interval  - QTc 552 ms on admission  - Check magnesium, continue cardiac monitoring, avoid QT-prolonging medications    DVT prophylaxis: Lovenox Code Status: Full  Level of Care: Level of care: Telemetry Family Communication: None present  Disposition Plan:  Patient is from: Home  Anticipated d/c is to: TBD Anticipated d/c date is: 10/27 or 05/23/22  Patient currently: Pending stable renal function, may need PT eval  Consults called: none  Admission status: Observation     [name], MD [facility]  05/21/2022, 4:35 AM

## 2022-05-21 NOTE — Progress Notes (Signed)
Mobility Specialist - Progress Note   05/21/22 1428  Mobility  Activity Ambulated with assistance in hallway  Level of Assistance Standby assist, set-up cues, supervision of patient - no hands on  Assistive Device Front wheel walker  Distance Ambulated (ft) 500 ft  Range of Motion/Exercises Active  Activity Response Tolerated well  Mobility Referral Yes  $Mobility charge 1 Mobility   Pt was found in bed and agreeable to ambulate. He was min-A when getting out of bed and stand-by for ambulating. Pt became fatigued and had some wheezing when ambulating. At EOS returned to recliner chair and was left with all necessities in reach and chair alarm on.  Ferd Hibbs Mobility Specialist

## 2022-05-21 NOTE — Hospital Course (Addendum)
Peter Mullen is a pleasant 88 y.o. male with PMH history significant for CAD s/p CABG, HTN, HLD, DM II, CKD 3B who presented to the ER after being found at home on the floor.  He had become progressively more weak and slid off the bed onto the floor and was unable to get up.  He was reported to have been on the floor for approximately 8 hours (hardwood floor).  He had began developing generalized weakness and body aches for approximately 2 to 3 days prior to admission.  He endorsed having already gotten his flu vaccine for the season earlier in October as well.  On work-up he was found to be positive for influenza A.  He was started on fluids and Tamiflu.  Further work-up also revealed elevated CK, 3755.  He was admitted for further work-up and monitoring. His CK was trended while on fluids.  He did have gradual improvement of CK after it peaked.  He developed no renal dysfunction and creatinine showed gradual improvement from his baseline while on fluids.  Due to some shortness of breath from IV fluids, he was also treated with Lasix with good response.  He was resumed back on home Lasix dosing at discharge with instructions to continue maintaining good hydration and pursuing outpatient lab work repeat at follow-up to ensure further improvement and stability. He was evaluated by PT and OT also with no needs or recommendations at discharge.

## 2022-05-21 NOTE — Assessment & Plan Note (Addendum)
-   Due to underlying infection with flu.  Patient was too weak to get up off the floor after falling - He is tentatively hoping for going home at discharge - Follow-up PT/OT eval: Ambulating well with therapy with no PT or OT needs at discharge

## 2022-05-21 NOTE — Assessment & Plan Note (Addendum)
-   initial CK 3755, peaked at [place] on 10/26 - continue IVF and trend CK daily - some transient elevation of CK 10/28 (confirmed on repeat) -CK did continue to downtrend with further monitoring - He will need repeat CK and BMP at follow-up

## 2022-05-21 NOTE — Assessment & Plan Note (Addendum)
-   Symptoms started around 05/18/2022 with body aches and generalized malaise - Influenza A positive on admission - Continue Tamiflu, renally adjusted - Continue droplet precautions -Course of Tamiflu completed during hospitalization

## 2022-05-21 NOTE — Progress Notes (Addendum)
PHARMACY NOTE:  ANTIMICROBIAL RENAL DOSAGE ADJUSTMENT  Current antimicrobial regimen includes a mismatch between antimicrobial dosage and estimated renal function.  As per policy approved by the Pharmacy & Therapeutics and Medical Executive Committees, the antimicrobial dosage will be adjusted accordingly.  Current antimicrobial dosage:  Tamiflu '30mg'$  po BID x 5 days  Indication: + influenza A  Renal Function:  Estimated Creatinine Clearance: 28.3 mL/min (A) (by C-G formula based on SCr of 1.81 mg/dL (H)). '[]'$      On intermittent HD, scheduled: '[]'$      On CRRT    Antimicrobial dosage has been changed to:  Tamiflu '30mg'$  po daily x5 days  Additional comments:   Thank you for allowing pharmacy to be a part of this patient's care.  Netta Cedars, [facility] 05/21/2022 3:52 AM  Scr decreased to 1.66 on repeat Bmet (calc CrCl 30.8) so will increase Tamiflu back to '30mg'$  BID  Netta Cedars, PharmD, BCPS 05/21/2022'@5'$ :44 AM

## 2022-05-21 NOTE — ED Notes (Signed)
SBAR report given to [place], Therapist, sports at this time.

## 2022-05-21 NOTE — Progress Notes (Signed)
Progress Note    Peter Mullen   MRN:[id number]  DOB: 08/23/1931  DOA: 05/20/2022     0 PCP: Tisovec, Richard W, MD  Initial CC: Weakness, fall at home  Hospital Course: Peter Mullen is a pleasant 88 y.o. male with PMH history significant for CAD s/p CABG, HTN, HLD, DM II, CKD 3B who presented to the ER after being found at home on the floor.  He had become progressively more weak and slid off the bed onto the floor and was unable to get up.  He was reported to have been on the floor for approximately 8 hours (hardwood floor).  He had began developing generalized weakness and body aches for approximately 2 to 3 days prior to admission.  He endorsed having already gotten his flu vaccine for the season earlier in October as well.  On work-up he was found to be positive for influenza A.  He was started on fluids and Tamiflu.  Further work-up also revealed elevated CK, 3755.  He was admitted for further work-up and monitoring.  Interval History:  Seen this morning with daughter present bedside as well.  Weakness had improved since admission.  He is hopeful for going home at discharge but understands need for remaining until CK improves.  Assessment and Plan: * Infection due to novel influenza A virus - Symptoms started around 05/18/2022 with body aches and generalized malaise - Influenza A positive on admission - Continue Tamiflu, renally adjusted - Continue droplet precautions  Rhabdomyolysis - initial CK 3755, trending up to 8272 today - continue IVF and trend CK daily  Stage 3b chronic kidney disease (CKD) (HCC) - patient has history of CKD3b. Baseline creat ~ 1.4 - 1.6, eGFR 40 -Currently at baseline - Does have some risk for pigment nephropathy from rhabdo if CK continues to uptrend - Continue fluids and trending renal function   Physical deconditioning - Due to underlying infection with flu.  Patient was too weak to get up off the floor after falling - He is  tentatively hoping for going home at discharge - Follow-up PT eval  Sinus bradycardia - Remains on low-dose Toprol - Also scheduled to see EP on 05/26/2022  Heart failure with mildly reduced ejection fraction (HFmrEF) (HCC) - Continue aspirin, statin - Continue Toprol - Hold Jardiance until renal function further improves - Hold olmesartan for now  Prolonged QT interval - QTc 552 on EKG -Caution with prolonging agents  HTN (hypertension) - Continue current regimen  DM2 (diabetes mellitus, type 2) (HCC) - Continue SSI and CBG monitoring  CAD in native artery - Continue aspirin, Lipitor   Old records reviewed in assessment of this patient  Antimicrobials:   DVT prophylaxis:  heparin injection 5,000 Units Start: 05/21/22 0600   Code Status:   Code Status: Full Code  Mobility Assessment (last 72 hours)     Mobility Assessment     Row Name 05/21/22 0816 05/21/22 0500 05/21/22 0340       Does patient have an order for bedrest or is patient medically unstable No - Continue assessment No - Continue assessment No - Continue assessment     What is the highest level of mobility based on the progressive mobility assessment? Level 4 (Walks with assist in room) - Balance while marching in place and cannot step forward and back - Complete -- Level 4 (Walks with assist in room) - Balance while marching in place and cannot step forward and back - Complete                Barriers to discharge:  Disposition Plan: Pending PT eval Status is: Inpatient  Objective: Blood pressure 136/75, pulse 73, temperature 98.7 F (37.1 C), temperature source Oral, resp. rate 18, height 5' 6" (1.676 m), weight 81.6 kg, SpO2 96 %.  Examination:  Physical Exam Constitutional:      General: He is not in acute distress.    Appearance: Normal appearance.     Comments: Generalized weakness appreciated  HENT:     Head: Normocephalic and atraumatic.     Mouth/Throat:     Mouth: Mucous  membranes are moist.  Eyes:     Extraocular Movements: Extraocular movements intact.  Cardiovascular:     Rate and Rhythm: Normal rate and regular rhythm.     Heart sounds: Normal heart sounds.  Pulmonary:     Effort: Pulmonary effort is normal. No respiratory distress.     Breath sounds: Normal breath sounds. No wheezing.  Abdominal:     General: Bowel sounds are normal. There is no distension.     Palpations: Abdomen is soft.     Tenderness: There is no abdominal tenderness.  Musculoskeletal:        General: Normal range of motion.     Cervical back: Normal range of motion and neck supple.  Skin:    General: Skin is warm and dry.  Neurological:     General: No focal deficit present.     Mental Status: He is alert.  Psychiatric:        Mood and Affect: Mood normal.        Behavior: Behavior normal.      Consultants:    Procedures:    Data Reviewed: Results for orders placed or performed during the hospital encounter of 05/20/22 (from the past 24 hour(s))  Basic metabolic panel     Status: Abnormal   Collection Time: 05/20/22  3:57 PM  Result Value Ref Range   Sodium 133 (L) 135 - 145 mmol/L   Potassium 4.2 3.5 - 5.1 mmol/L   Chloride 98 98 - 111 mmol/L   CO2 22 22 - 32 mmol/L   Glucose, Bld 148 (H) 70 - 99 mg/dL   BUN 26 (H) 8 - 23 mg/dL   Creatinine, Ser 1.81 (H) 0.61 - 1.24 mg/dL   Calcium 9.3 8.9 - 10.3 mg/dL   GFR, Estimated 36 (L) >60 mL/min   Anion gap 13 5 - 15  CBC     Status: Abnormal   Collection Time: 05/20/22  3:57 PM  Result Value Ref Range   WBC 7.8 4.0 - 10.5 K/uL   RBC 4.68 4.22 - 5.81 MIL/uL   Hemoglobin 15.0 13.0 - 17.0 g/dL   HCT 45.6 39.0 - 52.0 %   MCV 97.4 80.0 - 100.0 fL   MCH 32.1 26.0 - 34.0 pg   MCHC 32.9 30.0 - 36.0 g/dL   RDW 15.8 (H) 11.5 - 15.5 %   Platelets 162 150 - 400 K/uL   nRBC 0.0 0.0 - 0.2 %  CK     Status: Abnormal   Collection Time: 05/20/22  3:57 PM  Result Value Ref Range   Total CK 3,755 (H) 49 - 397 U/L   Urinalysis, Routine w reflex microscopic Urine, Unspecified Source     Status: Abnormal   Collection Time: 05/20/22  4:15 PM  Result Value Ref Range   Color, Urine YELLOW YELLOW   APPearance CLEAR CLEAR   Specific Gravity, Urine 1.029 1.005 - 1.030   pH 6.0 5.0 -  8.0   Glucose, UA >1,000 (A) NEGATIVE mg/dL   Hgb urine dipstick LARGE (A) NEGATIVE   Bilirubin Urine NEGATIVE NEGATIVE   Ketones, ur TRACE (A) NEGATIVE mg/dL   Protein, ur >300 (A) NEGATIVE mg/dL   Nitrite NEGATIVE NEGATIVE   Leukocytes,Ua NEGATIVE NEGATIVE   WBC, UA 0-5 0 - 5 WBC/hpf   Squamous Epithelial / LPF 0-5 0 - 5   Mucus PRESENT    Granular Casts, UA PRESENT   Resp Panel by RT-PCR (Flu A&B, Covid) Anterior Nasal Swab     Status: Abnormal   Collection Time: 05/20/22  6:31 PM   Specimen: Anterior Nasal Swab  Result Value Ref Range   SARS Coronavirus 2 by RT PCR NEGATIVE NEGATIVE   Influenza A by PCR POSITIVE (A) NEGATIVE   Influenza B by PCR NEGATIVE NEGATIVE  Hemoglobin A1c     Status: Abnormal   Collection Time: 05/21/22  4:38 AM  Result Value Ref Range   Hgb A1c MFr Bld 6.8 (H) 4.8 - 5.6 %   Mean Plasma Glucose 148.46 mg/dL  Basic metabolic panel     Status: Abnormal   Collection Time: 05/21/22  4:38 AM  Result Value Ref Range   Sodium 137 135 - 145 mmol/L   Potassium 3.7 3.5 - 5.1 mmol/L   Chloride 105 98 - 111 mmol/L   CO2 23 22 - 32 mmol/L   Glucose, Bld 127 (H) 70 - 99 mg/dL   BUN 30 (H) 8 - 23 mg/dL   Creatinine, Ser 1.66 (H) 0.61 - 1.24 mg/dL   Calcium 8.5 (L) 8.9 - 10.3 mg/dL   GFR, Estimated 39 (L) >60 mL/min   Anion gap 9 5 - 15  Magnesium     Status: None   Collection Time: 05/21/22  4:38 AM  Result Value Ref Range   Magnesium 1.7 1.7 - 2.4 mg/dL  Phosphorus     Status: None   Collection Time: 05/21/22  4:38 AM  Result Value Ref Range   Phosphorus 3.4 2.5 - 4.6 mg/dL  CBC     Status: None   Collection Time: 05/21/22  4:38 AM  Result Value Ref Range   WBC 6.3 4.0 - 10.5 K/uL   RBC  4.44 4.22 - 5.81 MIL/uL   Hemoglobin 14.3 13.0 - 17.0 g/dL   HCT 42.8 39.0 - 52.0 %   MCV 96.4 80.0 - 100.0 fL   MCH 32.2 26.0 - 34.0 pg   MCHC 33.4 30.0 - 36.0 g/dL   RDW 15.5 11.5 - 15.5 %   Platelets 152 150 - 400 K/uL   nRBC 0.0 0.0 - 0.2 %  CK     Status: Abnormal   Collection Time: 05/21/22  4:44 AM  Result Value Ref Range   Total CK 8,272 (H) 49 - 397 U/L  Glucose, capillary     Status: Abnormal   Collection Time: 05/21/22  7:30 AM  Result Value Ref Range   Glucose-Capillary 109 (H) 70 - 99 mg/dL  Glucose, capillary     Status: Abnormal   Collection Time: 05/21/22 11:40 AM  Result Value Ref Range   Glucose-Capillary 161 (H) 70 - 99 mg/dL    I have Reviewed nursing notes, Vitals, and Lab results since pt's last encounter. Pertinent lab results : see above I have ordered test including BMP, CBC, Mg I have reviewed the last note from staff over past 24 hours I have discussed pt's care plan and test results with nursing staff, case   manager   LOS: 0 days   Dwyane Dee, MD [facility] 05/21/2022, 1:58 PM

## 2022-05-21 NOTE — Assessment & Plan Note (Signed)
-   Remains on low-dose Toprol - Also scheduled to see EP on 05/26/2022

## 2022-05-22 DIAGNOSIS — M6282 Rhabdomyolysis: Secondary | ICD-10-CM | POA: Diagnosis not present

## 2022-05-22 DIAGNOSIS — R5381 Other malaise: Secondary | ICD-10-CM

## 2022-05-22 DIAGNOSIS — J09X2 Influenza due to identified novel influenza A virus with other respiratory manifestations: Secondary | ICD-10-CM | POA: Diagnosis not present

## 2022-05-22 LAB — BASIC METABOLIC PANEL
Anion gap: 8 (ref 5–15)
BUN: 24 mg/dL — ABNORMAL HIGH (ref 8–23)
CO2: 21 mmol/L — ABNORMAL LOW (ref 22–32)
Calcium: 8.2 mg/dL — ABNORMAL LOW (ref 8.9–10.3)
Chloride: 106 mmol/L (ref 98–111)
Creatinine, Ser: 1.37 mg/dL — ABNORMAL HIGH (ref 0.61–1.24)
GFR, Estimated: 50 mL/min — ABNORMAL LOW (ref >60)
Glucose, Bld: 113 mg/dL — ABNORMAL HIGH (ref 70–99)
Potassium: 3.2 mmol/L — ABNORMAL LOW (ref 3.5–5.1)
Sodium: 135 mmol/L (ref 135–145)

## 2022-05-22 LAB — GLUCOSE, CAPILLARY
Glucose-Capillary: 109 mg/dL — ABNORMAL HIGH (ref 70–99)
Glucose-Capillary: 115 mg/dL — ABNORMAL HIGH (ref 70–99)
Glucose-Capillary: 160 mg/dL — ABNORMAL HIGH (ref 70–99)
Glucose-Capillary: 145 mg/dL — ABNORMAL HIGH (ref 70–99)

## 2022-05-22 LAB — CBC WITH DIFFERENTIAL/PLATELET
Abs Immature Granulocytes: 0.02 10*3/uL (ref 0.00–0.07)
Basophils Absolute: 0 10*3/uL (ref 0.0–0.1)
Basophils Relative: 1 %
Eosinophils Absolute: 0 10*3/uL (ref 0.0–0.5)
Eosinophils Relative: 1 %
HCT: 42.5 % (ref 39.0–52.0)
Hemoglobin: 14.1 g/dL (ref 13.0–17.0)
Immature Granulocytes: 0 %
Lymphocytes Relative: 39 %
Lymphs Abs: 2.4 10*3/uL (ref 0.7–4.0)
MCH: 32 pg (ref 26.0–34.0)
MCHC: 33.2 g/dL (ref 30.0–36.0)
MCV: 96.4 fL (ref 80.0–100.0)
Monocytes Absolute: 0.8 10*3/uL (ref 0.1–1.0)
Monocytes Relative: 13 %
Neutro Abs: 2.9 10*3/uL (ref 1.7–7.7)
Neutrophils Relative %: 46 %
Platelets: 150 10*3/uL (ref 150–400)
RBC: 4.41 MIL/uL (ref 4.22–5.81)
RDW: 15.3 % (ref 11.5–15.5)
WBC: 6.2 10*3/uL (ref 4.0–10.5)
nRBC: 0 % (ref 0.0–0.2)

## 2022-05-22 LAB — MAGNESIUM: Magnesium: 1.6 mg/dL — ABNORMAL LOW (ref 1.7–2.4)

## 2022-05-22 LAB — CK: Total CK: 4139 U/L — ABNORMAL HIGH (ref 49–397)

## 2022-05-22 MED ORDER — POTASSIUM CHLORIDE CRYS ER 20 MEQ PO TBCR
40.0000 meq | EXTENDED_RELEASE_TABLET | Freq: Once | ORAL | Status: AC
  Administered 2022-05-22: 40 meq via ORAL
  Filled 2022-05-22: qty 2

## 2022-05-22 MED ORDER — MAGNESIUM SULFATE 2 GM/50ML IV SOLN
2.0000 g | Freq: Once | INTRAVENOUS | Status: AC
  Administered 2022-05-22: 2 g via INTRAVENOUS
  Filled 2022-05-22: qty 50

## 2022-05-22 NOTE — Progress Notes (Signed)
Progress Note    Peter Mullen   OEV:[id number]  DOB: 08-08-1931  DOA: 05/20/2022     1 PCP: Haywood Pao, MD  Initial CC: Weakness, fall at home  Hospital Course: Peter Mullen is a pleasant 88 y.o. male with PMH history significant for CAD s/p CABG, HTN, HLD, DM II, CKD 3B who presented to the ER after being found at home on the floor.  He had become progressively more weak and slid off the bed onto the floor and was unable to get up.  He was reported to have been on the floor for approximately 8 hours (hardwood floor).  He had began developing generalized weakness and body aches for approximately 2 to 3 days prior to admission.  He endorsed having already gotten his flu vaccine for the season earlier in October as well.  On work-up he was found to be positive for influenza A.  He was started on fluids and Tamiflu.  Further work-up also revealed elevated CK, 3755.  He was admitted for further work-up and monitoring.  Interval History:  No events overnight.  Family present bedside this morning.  He is feeling improved compared to admission.  He is ambulating well with therapy also including doing stairs. Reviewed lab results with patient and family bedside this morning.  Tentative plan for discharge tomorrow if further improvement in values.  Assessment and Plan: * Infection due to novel influenza A virus - Symptoms started around 05/18/2022 with body aches and generalized malaise - Influenza A positive on admission - Continue Tamiflu, renally adjusted - Continue droplet precautions  Rhabdomyolysis - initial CK 3755, peaked at [place] on 10/26 - continue IVF and trend CK daily  Stage 3b chronic kidney disease (CKD) ([place]) - patient has history of CKD3b. Baseline creat ~ 1.4 - 1.6, eGFR 40 -Currently at baseline - Does have some risk for pigment nephropathy from rhabdo if CK continues to uptrend - Continue fluids and trending renal function   Physical  deconditioning - Due to underlying infection with flu.  Patient was too weak to get up off the floor after falling - He is tentatively hoping for going home at discharge - Follow-up PT/OT eval: Ambulating well with therapy with no PT or OT needs at discharge  Sinus bradycardia - Remains on low-dose Toprol - Also scheduled to see EP on 05/26/2022  Heart failure with mildly reduced ejection fraction (HFmrEF) (HCC) - Continue aspirin, statin - Continue Toprol - Hold Jardiance until renal function further improves - Hold olmesartan for now  Prolonged QT interval - QTc 552 on EKG -Caution with prolonging agents  HTN (hypertension) - Continue current regimen  DM2 (diabetes mellitus, type 2) (HCC) - Continue SSI and CBG monitoring  CAD in native artery - Continue aspirin, Lipitor   Old records reviewed in assessment of this patient  Antimicrobials:   DVT prophylaxis:  heparin injection 5,000 Units Start: 05/21/22 0600   Code Status:   Code Status: Full Code  Mobility Assessment (last 72 hours)     Mobility Assessment     Row Name 05/22/22 1300 05/22/22 0900 05/22/22 0800 05/21/22 2005 05/21/22 0816   Does patient have an order for bedrest or is patient medically unstable -- -- No - Continue assessment No - Continue assessment No - Continue assessment   What is the highest level of mobility based on the progressive mobility assessment? Level 5 (Walks with assist in room/hall) - Balance while stepping forward/back and can walk in room  with assist - Complete Level 5 (Walks with assist in room/hall) - Balance while stepping forward/back and can walk in room with assist - Complete Level 5 (Walks with assist in room/hall) - Balance while stepping forward/back and can walk in room with assist - Complete Level 4 (Walks with assist in room) - Balance while marching in place and cannot step forward and back - Complete Level 4 (Walks with assist in room) - Balance while marching in place  and cannot step forward and back - Complete    Row Name 05/21/22 0500 05/21/22 0340         Does patient have an order for bedrest or is patient medically unstable No - Continue assessment No - Continue assessment      What is the highest level of mobility based on the progressive mobility assessment? -- Level 4 (Walks with assist in room) - Balance while marching in place and cannot step forward and back - Complete               Barriers to discharge:  Disposition Plan: Pending PT eval Status is: Inpatient  Objective: Blood pressure 131/60, pulse 60, temperature 98.6 F (37 C), temperature source Oral, resp. rate 16, height _0  (1.676 m), weight 80.9 kg, SpO2 97 %.  Examination:  Physical Exam Constitutional:      General: He is not in acute distress.    Appearance: Normal appearance.     Comments: Generalized weakness appreciated  HENT:     Head: Normocephalic and atraumatic.     Mouth/Throat:     Mouth: Mucous membranes are moist.  Eyes:     Extraocular Movements: Extraocular movements intact.  Cardiovascular:     Rate and Rhythm: Normal rate and regular rhythm.     Heart sounds: Normal heart sounds.  Pulmonary:     Effort: Pulmonary effort is normal. No respiratory distress.     Breath sounds: Normal breath sounds. No wheezing.  Abdominal:     General: Bowel sounds are normal. There is no distension.     Palpations: Abdomen is soft.     Tenderness: There is no abdominal tenderness.  Musculoskeletal:        General: Normal range of motion.     Cervical back: Normal range of motion and neck supple.  Skin:    General: Skin is warm and dry.  Neurological:     General: No focal deficit present.     Mental Status: He is alert.  Psychiatric:        Mood and Affect: Mood normal.        Behavior: Behavior normal.      Consultants:    Procedures:    Data Reviewed: Results for orders placed or performed during the hospital encounter of 05/20/22 (from the  past 24 hour(s))  Glucose, capillary     Status: Abnormal   Collection Time: 05/21/22  5:27 PM  Result Value Ref Range   Glucose-Capillary 116 (H) 70 - 99 mg/dL  Glucose, capillary     Status: Abnormal   Collection Time: 05/21/22  9:34 PM  Result Value Ref Range   Glucose-Capillary 156 (H) 70 - 99 mg/dL  Basic metabolic panel     Status: Abnormal   Collection Time: 05/22/22  5:04 AM  Result Value Ref Range   Sodium 135 135 - 145 mmol/L   Potassium 3.2 (L) 3.5 - 5.1 mmol/L   Chloride 106 98 - 111 mmol/L   CO2 21 (L) 22 -  32 mmol/L   Glucose, Bld 113 (H) 70 - 99 mg/dL   BUN 24 (H) 8 - 23 mg/dL   Creatinine, Ser 1.37 (H) 0.61 - 1.24 mg/dL   Calcium 8.2 (L) 8.9 - 10.3 mg/dL   GFR, Estimated 50 (L) >60 mL/min   Anion gap 8 5 - 15  CBC with Differential/Platelet     Status: None   Collection Time: 05/22/22  5:04 AM  Result Value Ref Range   WBC 6.2 4.0 - 10.5 K/uL   RBC 4.41 4.22 - 5.81 MIL/uL   Hemoglobin 14.1 13.0 - 17.0 g/dL   HCT 42.5 39.0 - 52.0 %   MCV 96.4 80.0 - 100.0 fL   MCH 32.0 26.0 - 34.0 pg   MCHC 33.2 30.0 - 36.0 g/dL   RDW 15.3 11.5 - 15.5 %   Platelets 150 150 - 400 K/uL   nRBC 0.0 0.0 - 0.2 %   Neutrophils Relative % 46 %   Neutro Abs 2.9 1.7 - 7.7 K/uL   Lymphocytes Relative 39 %   Lymphs Abs 2.4 0.7 - 4.0 K/uL   Monocytes Relative 13 %   Monocytes Absolute 0.8 0.1 - 1.0 K/uL   Eosinophils Relative 1 %   Eosinophils Absolute 0.0 0.0 - 0.5 K/uL   Basophils Relative 1 %   Basophils Absolute 0.0 0.0 - 0.1 K/uL   Immature Granulocytes 0 %   Abs Immature Granulocytes 0.02 0.00 - 0.07 K/uL  Magnesium     Status: Abnormal   Collection Time: 05/22/22  5:04 AM  Result Value Ref Range   Magnesium 1.6 (L) 1.7 - 2.4 mg/dL  CK     Status: Abnormal   Collection Time: 05/22/22  5:04 AM  Result Value Ref Range   Total CK 4,139 (H) 49 - 397 U/L  Glucose, capillary     Status: Abnormal   Collection Time: 05/22/22  7:37 AM  Result Value Ref Range    Glucose-Capillary 109 (H) 70 - 99 mg/dL  Glucose, capillary     Status: Abnormal   Collection Time: 05/22/22 12:10 PM  Result Value Ref Range   Glucose-Capillary 115 (H) 70 - 99 mg/dL    I have Reviewed nursing notes, Vitals, and Lab results since pt's last encounter. Pertinent lab results : see above I have ordered test including BMP, CBC, Mg I have reviewed the last note from staff over past 24 hours I have discussed pt's care plan and test results with nursing staff, case manager   LOS: 1 day   Dwyane Dee, MD [facility] 05/22/2022, 3:53 PM

## 2022-05-22 NOTE — Evaluation (Signed)
Occupational Therapy Evaluation Patient Details Name: Peter Mullen MRN: [id number] DOB: 09/19/1931 Today's Date: 05/22/2022   History of Present Illness Pt is an 88 yo male admitted with weakness s/p fall at home and found to have the flu. PMH:  CAD and CABG   Clinical Impression   Pt admitted with the above diagnosis and overall is very close to his baseline level of functioning despite feeling weak.  Pt lives alone but family is 5 minutes away and he will d/c home with them for the first few days. Pt is dressing himself and toileting independently in the room.  Feel pt is very close to baseline and will be safe with this d/c plan and no follow up OT. Recommend mobility specialist walk with pt while pt is here. No further OT needs.      Recommendations for follow up therapy are one component of a multi-disciplinary discharge planning process, led by the attending physician.  Recommendations may be updated based on patient status, additional functional criteria and insurance authorization.   Follow Up Recommendations  No OT follow up    Assistance Recommended at Discharge PRN  Patient can return home with the following A little help with bathing/dressing/bathroom;Assistance with cooking/housework;Assist for transportation;Help with stairs or ramp for entrance    Functional Status Assessment  Patient has not had a recent decline in their functional status  Equipment Recommendations  None recommended by OT    Recommendations for Other Services       Precautions / Restrictions Precautions Precautions: Fall Precaution Comments: Pt fell at home when weak from flu Restrictions Weight Bearing Restrictions: No      Mobility Bed Mobility Overal bed mobility: Modified Independent             General bed mobility comments: increased time    Transfers Overall transfer level: Needs assistance Equipment used: Rolling walker (2 wheels) Transfers: Sit to/from Stand, Bed to  chair/wheelchair/BSC Sit to Stand: Supervision     Step pivot transfers: Supervision     General transfer comment: no physical assist given.  cues for hand placement      Balance Overall balance assessment: No apparent balance deficits (not formally assessed)                                         ADL either performed or assessed with clinical judgement   ADL Overall ADL's : At baseline                                       General ADL Comments: Pt with some general weakness from the virus but is walking, completing adls and functioning at or close to baseline. Family in room with him assisting appropriatley.     Vision Baseline Vision/History: 1 Wears glasses Ability to See in Adequate Light: 0 Adequate Patient Visual Report: No change from baseline Vision Assessment?: No apparent visual deficits     Perception     Praxis      Pertinent Vitals/Pain Pain Assessment Pain Assessment: No/denies pain     Hand Dominance Right   Extremity/Trunk Assessment Upper Extremity Assessment Upper Extremity Assessment: Overall WFL for tasks assessed   Lower Extremity Assessment Lower Extremity Assessment: Defer to PT evaluation   Cervical / Trunk Assessment Cervical / Trunk Assessment: Normal  Communication Communication Communication: HOH   Cognition Arousal/Alertness: Awake/alert Behavior During Therapy: WFL for tasks assessed/performed Overall Cognitive Status: Within Functional Limits for tasks assessed                                       General Comments  Pt mobilized well, dressed self and toileted with no physical assist.    Exercises     Shoulder Instructions      Home Living Family/patient expects to be discharged to:: Private residence (sons home first then private home) Living Arrangements: Alone Available Help at Discharge: Family;Available 24 hours/day Type of Home: House Home Access: Stairs to  enter [employer] of Steps: 3   Home Layout: One level     Bathroom Shower/Tub: Occupational psychologist: Standard     Home Equipment: Conservation officer, nature (2 wheels)   Additional Comments: Pt lives alone but son and daughter love within 5 minutes. Pt will d/c home with son for a few days then return to his home. Pt is very independent and bowls 2x as week.      Prior Functioning/Environment Prior Level of Function : Independent/Modified Independent             Mobility Comments: walker only when weak ADLs Comments: indepdnent        OT Problem List:        OT Treatment/Interventions:      OT Goals(Current goals can be found in the care plan section) Acute Rehab OT Goals Patient Stated Goal: to get a bit stronger and head home OT Goal Formulation: All assessment and education complete, DC therapy  OT Frequency:      Co-evaluation              AM-PAC OT "6 Clicks" Daily Activity     Outcome Measure Help from another person eating meals?: None Help from another person taking care of personal grooming?: None Help from another person toileting, which includes using toliet, bedpan, or urinal?: None Help from another person bathing (including washing, rinsing, drying)?: None Help from another person to put on and taking off regular upper body clothing?: None Help from another person to put on and taking off regular lower body clothing?: None 6 Click Score: 24   End of Session Equipment Utilized During Treatment: Rolling walker (2 wheels) Nurse Communication: Mobility status  Activity Tolerance: Patient tolerated treatment well Patient left: in bed;with call bell/phone within reach;with bed alarm set  OT Visit Diagnosis: Unsteadiness on feet (R26.81)                Time: 6812-7517 OT Time Calculation (min): 23 min Charges:  OT General Charges $OT Visit: 1 Visit OT Evaluation $OT Eval Low Complexity: 1 Low  Glenford Peers 05/22/2022,  9:19 AM

## 2022-05-22 NOTE — Evaluation (Signed)
Physical Therapy Evaluation Patient Details Name: Peter Mullen MRN: [id number] DOB: [birth year] Today's Date: 05/22/2022  History of Present Illness  Pt is an 88 yo male admitted with weakness s/p fall at home and found to have the flu. PMH:  CAD and CABG  Clinical Impression  Pt is an 88y.o. male with above HPI. Pt lives alone and is independent at baseline. Pt performed sit to stand transfers with and ambulation total of ~57f with supervision for safety. Pt also able to negotiate stairs with supervision and increased time. Pt plans to d/c home to son's and then daughter's home after that while recovering. Pt is supervision for all mobility, no further skilled PT needs identified at this time. Discussed with pt, family, RN, and nurse tech on continued mobilization with supervision during stay.        Recommendations for follow up therapy are one component of a multi-disciplinary discharge planning process, led by the attending physician.  Recommendations may be updated based on patient status, additional functional criteria and insurance authorization.  Follow Up Recommendations No PT follow up      Assistance Recommended at Discharge Set up Supervision/Assistance  Patient can return home with the following       Equipment Recommendations None recommended by PT  Recommendations for Other Services       Functional Status Assessment Patient has had a recent decline in their functional status and demonstrates the ability to make significant improvements in function in a reasonable and predictable amount of time.     Precautions / Restrictions Precautions Precautions: Fall Precaution Comments: Pt fell at home when weak from flu Restrictions Weight Bearing Restrictions: No      Mobility  Bed Mobility Overal bed mobility: Modified Independent             General bed mobility comments: increased time. education on use of log roll for energy conservation/efficency     Transfers Overall transfer level: Needs assistance Equipment used: None Transfers: Sit to/from Stand Sit to Stand: Supervision           General transfer comment: no physical assist required    Ambulation/Gait Ambulation/Gait assistance: Supervision Gait Distance (Feet): 60 Feet Assistive device: IV Pole Gait Pattern/deviations: Step-through pattern, Decreased stride length Gait velocity: decreased     General Gait Details: initially with use of IV pole for stability, able to progress to ambulating with no UE support. Therapist managing IV pole/line.  Stairs Stairs: Yes Stairs assistance: Supervision Stair Management: One rail Right, Step to pattern, Forwards Number of Stairs: 4 General stair comments: descending sideways per baseline due to knee issues  Wheelchair Mobility    Modified Rankin (Stroke Patients Only)       Balance Overall balance assessment: No apparent balance deficits (not formally assessed)                                           Pertinent Vitals/Pain Pain Assessment Pain Assessment: No/denies pain    Home Living Family/patient expects to be discharged to:: Private residence Living Arrangements: Alone Available Help at Discharge: Family;Available 24 hours/day Type of Home: House Home Access: Stairs to enter;Ramped entrance (side entry has ramp) Entrance Stairs-Rails: None Entrance Stairs-Number of Steps: 3   Home Layout: One level Home Equipment: RConservation officer, nature(2 wheels);Electric scooter Additional Comments: bowls 2x/week. Son house then daughter house. Son 2 steps and platform  with railing. Daughter has 2-3 steps no railing. Family states that they will try to get railing on pt steps at home.    Prior Function Prior Level of Function : Independent/Modified Independent             Mobility Comments: scooter for long distance ADLs Comments: indepdnent     Hand Dominance   Dominant Hand: Right     Extremity/Trunk Assessment   Upper Extremity Assessment Upper Extremity Assessment: Defer to OT evaluation    Lower Extremity Assessment Lower Extremity Assessment: Generalized weakness    Cervical / Trunk Assessment Cervical / Trunk Assessment: Normal  Communication   Communication: HOH  Cognition Arousal/Alertness: Awake/alert Behavior During Therapy: WFL for tasks assessed/performed Overall Cognitive Status: Within Functional Limits for tasks assessed                                          General Comments      Exercises     Assessment/Plan    PT Assessment Patient does not need any further PT services  PT Problem List         PT Treatment Interventions      PT Goals (Current goals can be found in the Care Plan section)  Acute Rehab PT Goals Patient Stated Goal: get strength back PT Goal Formulation: With patient/family Time For Goal Achievement: 06/05/22 Potential to Achieve Goals: Good    Frequency       Co-evaluation               AM-PAC PT "6 Clicks" Mobility  Outcome Measure Help needed turning from your back to your side while in a flat bed without using bedrails?: A Little Help needed moving from lying on your back to sitting on the side of a flat bed without using bedrails?: A Little Help needed moving to and from a bed to a chair (including a wheelchair)?: A Little Help needed standing up from a chair using your arms (e.g., wheelchair or bedside chair)?: A Little Help needed to walk in hospital room?: A Little Help needed climbing 3-5 steps with a railing? : A Little 6 Click Score: 18    End of Session Equipment Utilized During Treatment: Gait belt Activity Tolerance: Patient tolerated treatment well Patient left: in chair;with call bell/phone within reach;with chair alarm set;with family/visitor present Nurse Communication: Mobility status PT Visit Diagnosis: Unsteadiness on feet (R26.81);Muscle weakness  (generalized) (M62.81)    Time: 6378-5885 PT Time Calculation (min) (ACUTE ONLY): 22 min   Charges:   PT Evaluation $PT Eval Low Complexity: 1 Low          Festus Barren PT, DPT  Acute Rehabilitation Services  Office [phone]  05/22/2022, 2:34 PM

## 2022-05-23 DIAGNOSIS — R5381 Other malaise: Secondary | ICD-10-CM | POA: Diagnosis not present

## 2022-05-23 DIAGNOSIS — M6282 Rhabdomyolysis: Secondary | ICD-10-CM | POA: Diagnosis not present

## 2022-05-23 DIAGNOSIS — J09X2 Influenza due to identified novel influenza A virus with other respiratory manifestations: Secondary | ICD-10-CM | POA: Diagnosis not present

## 2022-05-23 LAB — BASIC METABOLIC PANEL
Anion gap: 7 (ref 5–15)
BUN: 23 mg/dL (ref 8–23)
CO2: 22 mmol/L (ref 22–32)
Calcium: 8.1 mg/dL — ABNORMAL LOW (ref 8.9–10.3)
Chloride: 110 mmol/L (ref 98–111)
Creatinine, Ser: 1.22 mg/dL (ref 0.61–1.24)
GFR, Estimated: 57 mL/min — ABNORMAL LOW (ref >60)
Glucose, Bld: 136 mg/dL — ABNORMAL HIGH (ref 70–99)
Potassium: 3.4 mmol/L — ABNORMAL LOW (ref 3.5–5.1)
Sodium: 139 mmol/L (ref 135–145)

## 2022-05-23 LAB — CBC WITH DIFFERENTIAL/PLATELET
Abs Immature Granulocytes: 0.02 10*3/uL (ref 0.00–0.07)
Basophils Absolute: 0 10*3/uL (ref 0.0–0.1)
Basophils Relative: 1 %
Eosinophils Absolute: 0.1 10*3/uL (ref 0.0–0.5)
Eosinophils Relative: 3 %
HCT: 40.2 % (ref 39.0–52.0)
Hemoglobin: 13.2 g/dL (ref 13.0–17.0)
Immature Granulocytes: 0 %
Lymphocytes Relative: 42 %
Lymphs Abs: 2.2 10*3/uL (ref 0.7–4.0)
MCH: 31.7 pg (ref 26.0–34.0)
MCHC: 32.8 g/dL (ref 30.0–36.0)
MCV: 96.6 fL (ref 80.0–100.0)
Monocytes Absolute: 0.6 10*3/uL (ref 0.1–1.0)
Monocytes Relative: 12 %
Neutro Abs: 2.1 10*3/uL (ref 1.7–7.7)
Neutrophils Relative %: 42 %
Platelets: 142 10*3/uL — ABNORMAL LOW (ref 150–400)
RBC: 4.16 MIL/uL — ABNORMAL LOW (ref 4.22–5.81)
RDW: 15 % (ref 11.5–15.5)
WBC: 5.1 10*3/uL (ref 4.0–10.5)
nRBC: 0 % (ref 0.0–0.2)

## 2022-05-23 LAB — GLUCOSE, CAPILLARY
Glucose-Capillary: 116 mg/dL — ABNORMAL HIGH (ref 70–99)
Glucose-Capillary: 166 mg/dL — ABNORMAL HIGH (ref 70–99)
Glucose-Capillary: 128 mg/dL — ABNORMAL HIGH (ref 70–99)
Glucose-Capillary: 192 mg/dL — ABNORMAL HIGH (ref 70–99)

## 2022-05-23 LAB — MAGNESIUM: Magnesium: 2.1 mg/dL (ref 1.7–2.4)

## 2022-05-23 LAB — CK
Total CK: 6545 U/L — ABNORMAL HIGH (ref 49–397)
Total CK: 6919 U/L — ABNORMAL HIGH (ref 49–397)

## 2022-05-23 MED ORDER — SENNOSIDES-DOCUSATE SODIUM 8.6-50 MG PO TABS
1.0000 | ORAL_TABLET | Freq: Two times a day (BID) | ORAL | Status: DC
  Administered 2022-05-23 – 2022-05-24 (×4): 1 via ORAL
  Filled 2022-05-23 (×5): qty 1

## 2022-05-23 MED ORDER — SORBITOL 70 % SOLN: 30.0000 mL | Freq: Every day | Status: DC | PRN

## 2022-05-23 MED ORDER — POTASSIUM CHLORIDE CRYS ER 20 MEQ PO TBCR
40.0000 meq | EXTENDED_RELEASE_TABLET | Freq: Once | ORAL | Status: AC
  Administered 2022-05-23: 40 meq via ORAL
  Filled 2022-05-23: qty 2

## 2022-05-23 MED ORDER — LACTULOSE 10 GM/15ML PO SOLN: 20.0000 g | Freq: Two times a day (BID) | ORAL | Status: DC | PRN

## 2022-05-23 MED ORDER — POLYETHYLENE GLYCOL 3350 17 G PO PACK
17.0000 g | PACK | Freq: Every day | ORAL | Status: DC
  Administered 2022-05-23 – 2022-05-25 (×3): 17 g via ORAL
  Filled 2022-05-23 (×3): qty 1

## 2022-05-23 NOTE — Progress Notes (Signed)
Progress Note    Peter Mullen   BBC:[id number]  DOB: 1932/04/27  DOA: 05/20/2022     2 PCP: Haywood Pao, MD  Initial CC: Weakness, fall at home  Hospital Course: Peter Mullen is a pleasant 88 y.o. male with PMH history significant for CAD s/p CABG, HTN, HLD, DM II, CKD 3B who presented to the ER after being found at home on the floor.  He had become progressively more weak and slid off the bed onto the floor and was unable to get up.  He was reported to have been on the floor for approximately 8 hours (hardwood floor).  He had began developing generalized weakness and body aches for approximately 2 to 3 days prior to admission.  He endorsed having already gotten his flu vaccine for the season earlier in October as well.  On work-up he was found to be positive for influenza A.  He was started on fluids and Tamiflu.  Further work-up also revealed elevated CK, 3755.  He was admitted for further work-up and monitoring.  Interval History:  No events overnight.  Family present bedside this morning.  Patient feeling still improved and okay.  Continues to ambulate well.  Reviewed labs bedside with patient and family this morning. We will monitor CK again overnight and repeat in the morning.  Assessment and Plan: * Infection due to novel influenza A virus - Symptoms started around 05/18/2022 with body aches and generalized malaise - Influenza A positive on admission - Continue Tamiflu, renally adjusted - Continue droplet precautions  Rhabdomyolysis - initial CK 3755, peaked at [place] on 10/26 - continue IVF and trend CK daily - some transient elevation of CK 10/28 (confirmed on repeat) - increase IVF rate and continue trending; renal function remains stable and improved   Stage 3b chronic kidney disease (CKD) ([place]) - patient has history of CKD3b. Baseline creat ~ 1.4 - 1.6, eGFR 40 -Currently at baseline - Does have some risk for pigment nephropathy from rhabdo if CK  continues to uptrend - Continue fluids and trending renal function   Physical deconditioning - Due to underlying infection with flu.  Patient was too weak to get up off the floor after falling - He is tentatively hoping for going home at discharge - Follow-up PT/OT eval: Ambulating well with therapy with no PT or OT needs at discharge  Sinus bradycardia - Remains on low-dose Toprol - Also scheduled to see EP on 05/26/2022  Heart failure with mildly reduced ejection fraction (HFmrEF) (HCC) - Continue aspirin, statin - Continue Toprol - Hold Jardiance until renal function further improves - Hold olmesartan for now  Prolonged QT interval - QTc 552 on EKG -Caution with prolonging agents  HTN (hypertension) - Continue current regimen  DM2 (diabetes mellitus, type 2) (HCC) - Continue SSI and CBG monitoring  CAD in native artery - Continue aspirin, Lipitor   Old records reviewed in assessment of this patient  Antimicrobials:   DVT prophylaxis:  heparin injection 5,000 Units Start: 05/21/22 0600   Code Status:   Code Status: Full Code  Mobility Assessment (last 72 hours)     Mobility Assessment     Row Name 05/22/22 2030 05/22/22 1300 05/22/22 0900 05/22/22 0800 05/21/22 2005   Does patient have an order for bedrest or is patient medically unstable No - Continue assessment -- -- No - Continue assessment No - Continue assessment   What is the highest level of mobility based on the progressive mobility assessment?  Level 5 (Walks with assist in room/hall) - Balance while stepping forward/back and can walk in room with assist - Complete Level 5 (Walks with assist in room/hall) - Balance while stepping forward/back and can walk in room with assist - Complete Level 5 (Walks with assist in room/hall) - Balance while stepping forward/back and can walk in room with assist - Complete Level 5 (Walks with assist in room/hall) - Balance while stepping forward/back and can walk in room  with assist - Complete Level 4 (Walks with assist in room) - Balance while marching in place and cannot step forward and back - Complete    Row Name 05/21/22 0816 05/21/22 0500 05/21/22 0340       Does patient have an order for bedrest or is patient medically unstable No - Continue assessment No - Continue assessment No - Continue assessment     What is the highest level of mobility based on the progressive mobility assessment? Level 4 (Walks with assist in room) - Balance while marching in place and cannot step forward and back - Complete -- Level 4 (Walks with assist in room) - Balance while marching in place and cannot step forward and back - Complete              Barriers to discharge:  Disposition Plan: Pending PT eval Status is: Inpatient  Objective: Blood pressure (!) 111/57, pulse (!) 58, temperature 97.9 F (36.6 C), temperature source Oral, resp. rate 18, height 5' 6" (1.676 m), weight 88.4 kg, SpO2 96 %.  Examination:  Physical Exam Constitutional:      General: He is not in acute distress.    Appearance: Normal appearance.     Comments: Generalized weakness appreciated  HENT:     Head: Normocephalic and atraumatic.     Mouth/Throat:     Mouth: Mucous membranes are moist.  Eyes:     Extraocular Movements: Extraocular movements intact.  Cardiovascular:     Rate and Rhythm: Normal rate and regular rhythm.     Heart sounds: Normal heart sounds.  Pulmonary:     Effort: Pulmonary effort is normal. No respiratory distress.     Breath sounds: No wheezing.     Comments: Mild crackles noted Abdominal:     General: Bowel sounds are normal. There is no distension.     Palpations: Abdomen is soft.     Tenderness: There is no abdominal tenderness.  Musculoskeletal:        General: Normal range of motion.     Cervical back: Normal range of motion and neck supple.  Skin:    General: Skin is warm and dry.  Neurological:     General: No focal deficit present.     Mental  Status: He is alert.  Psychiatric:        Mood and Affect: Mood normal.        Behavior: Behavior normal.      Consultants:    Procedures:    Data Reviewed: Results for orders placed or performed during the hospital encounter of 05/20/22 (from the past 24 hour(s))  Glucose, capillary     Status: Abnormal   Collection Time: 05/22/22  5:20 PM  Result Value Ref Range   Glucose-Capillary 160 (H) 70 - 99 mg/dL  Glucose, capillary     Status: Abnormal   Collection Time: 05/22/22  9:41 PM  Result Value Ref Range   Glucose-Capillary 145 (H) 70 - 99 mg/dL  Basic metabolic panel  Status: Abnormal   Collection Time: 05/23/22  5:13 AM  Result Value Ref Range   Sodium 139 135 - 145 mmol/L   Potassium 3.4 (L) 3.5 - 5.1 mmol/L   Chloride 110 98 - 111 mmol/L   CO2 22 22 - 32 mmol/L   Glucose, Bld 136 (H) 70 - 99 mg/dL   BUN 23 8 - 23 mg/dL   Creatinine, Ser 1.22 0.61 - 1.24 mg/dL   Calcium 8.1 (L) 8.9 - 10.3 mg/dL   GFR, Estimated 57 (L) >60 mL/min   Anion gap 7 5 - 15  CBC with Differential/Platelet     Status: Abnormal   Collection Time: 05/23/22  5:13 AM  Result Value Ref Range   WBC 5.1 4.0 - 10.5 K/uL   RBC 4.16 (L) 4.22 - 5.81 MIL/uL   Hemoglobin 13.2 13.0 - 17.0 g/dL   HCT 40.2 39.0 - 52.0 %   MCV 96.6 80.0 - 100.0 fL   MCH 31.7 26.0 - 34.0 pg   MCHC 32.8 30.0 - 36.0 g/dL   RDW 15.0 11.5 - 15.5 %   Platelets 142 (L) 150 - 400 K/uL   nRBC 0.0 0.0 - 0.2 %   Neutrophils Relative % 42 %   Neutro Abs 2.1 1.7 - 7.7 K/uL   Lymphocytes Relative 42 %   Lymphs Abs 2.2 0.7 - 4.0 K/uL   Monocytes Relative 12 %   Monocytes Absolute 0.6 0.1 - 1.0 K/uL   Eosinophils Relative 3 %   Eosinophils Absolute 0.1 0.0 - 0.5 K/uL   Basophils Relative 1 %   Basophils Absolute 0.0 0.0 - 0.1 K/uL   Immature Granulocytes 0 %   Abs Immature Granulocytes 0.02 0.00 - 0.07 K/uL  Magnesium     Status: None   Collection Time: 05/23/22  5:13 AM  Result Value Ref Range   Magnesium 2.1 1.7 - 2.4  mg/dL  CK     Status: Abnormal   Collection Time: 05/23/22  5:13 AM  Result Value Ref Range   Total CK 6,545 (H) 49 - 397 U/L  Glucose, capillary     Status: Abnormal   Collection Time: 05/23/22  7:51 AM  Result Value Ref Range   Glucose-Capillary 116 (H) 70 - 99 mg/dL  CK     Status: Abnormal   Collection Time: 05/23/22  9:26 AM  Result Value Ref Range   Total CK 6,919 (H) 49 - 397 U/L  Glucose, capillary     Status: Abnormal   Collection Time: 05/23/22 12:16 PM  Result Value Ref Range   Glucose-Capillary 166 (H) 70 - 99 mg/dL    I have Reviewed nursing notes, Vitals, and Lab results since pt's last encounter. Pertinent lab results : see above I have ordered test including BMP, CBC, Mg I have reviewed the last note from staff over past 24 hours I have discussed pt's care plan and test results with nursing staff, case manager   LOS: 2 days   Dwyane Dee, MD [facility] 05/23/2022, 12:50 PM

## 2022-05-23 NOTE — Progress Notes (Signed)
  Transition of Care [facility]) Screening Note   Patient Details  Name: TONEY DIFATTA Date of Birth: [birth year]   [facility] CM/SW Contact:    Kimber Relic, LCSW Phone Number: 05/23/2022, 11:15 AM    Transition of Care Department [facility]) has reviewed patient and no TOC needs have been identified at this time. We will continue to monitor patient advancement through interdisciplinary progression rounds. If new patient transition needs arise, please place a TOC consult.

## 2022-05-23 NOTE — Progress Notes (Signed)
Mobility Specialist - Progress Note   05/23/22 1130  Mobility  Activity Ambulated with assistance in hallway  Level of Assistance Standby assist, set-up cues, supervision of patient - no hands on  Assistive Device Front wheel walker  Distance Ambulated (ft) 490 ft  Range of Motion/Exercises Active  Activity Response Tolerated well  Mobility Referral Yes  $Mobility charge 1 Mobility   Pt was found in bed and agreeable to ambulate. Had x1 standing rest break during ambulation due to fatigue. Returned to bed and sat EOB and used the incentive spirometer. At EOS was left lying in bed with all necessities in reach.  Ferd Hibbs Mobility Specialist

## 2022-05-23 NOTE — Progress Notes (Signed)
Patient's respirations increased. More audible wheezing heard at bedside. Pt does c/o some SOB with activity. Md notified with orders to hold IVF x2 hours then resume at lower rate. Night nurse notified of pause time of fluids and when to restart.Eulas Post, RN

## 2022-05-24 DIAGNOSIS — J09X2 Influenza due to identified novel influenza A virus with other respiratory manifestations: Secondary | ICD-10-CM | POA: Diagnosis not present

## 2022-05-24 DIAGNOSIS — R5381 Other malaise: Secondary | ICD-10-CM | POA: Diagnosis not present

## 2022-05-24 DIAGNOSIS — M6282 Rhabdomyolysis: Secondary | ICD-10-CM | POA: Diagnosis not present

## 2022-05-24 DIAGNOSIS — N1832 Chronic kidney disease, stage 3b: Secondary | ICD-10-CM | POA: Diagnosis not present

## 2022-05-24 LAB — COMPREHENSIVE METABOLIC PANEL
ALT: 82 U/L — ABNORMAL HIGH (ref 0–44)
AST: 206 U/L — ABNORMAL HIGH (ref 15–41)
Albumin: 2.8 g/dL — ABNORMAL LOW (ref 3.5–5.0)
Alkaline Phosphatase: 72 U/L (ref 38–126)
Anion gap: 4 — ABNORMAL LOW (ref 5–15)
BUN: 17 mg/dL (ref 8–23)
CO2: 23 mmol/L (ref 22–32)
Calcium: 8.2 mg/dL — ABNORMAL LOW (ref 8.9–10.3)
Chloride: 111 mmol/L (ref 98–111)
Creatinine, Ser: 1.16 mg/dL (ref 0.61–1.24)
GFR, Estimated: >60 mL/min (ref >60)
Glucose, Bld: 132 mg/dL — ABNORMAL HIGH (ref 70–99)
Potassium: 3.6 mmol/L (ref 3.5–5.1)
Sodium: 138 mmol/L (ref 135–145)
Total Bilirubin: 0.8 mg/dL (ref 0.3–1.2)
Total Protein: 5.6 g/dL — ABNORMAL LOW (ref 6.5–8.1)

## 2022-05-24 LAB — CBC WITH DIFFERENTIAL/PLATELET
Abs Immature Granulocytes: 0.02 10*3/uL (ref 0.00–0.07)
Basophils Absolute: 0 10*3/uL (ref 0.0–0.1)
Basophils Relative: 1 %
Eosinophils Absolute: 0.2 10*3/uL (ref 0.0–0.5)
Eosinophils Relative: 3 %
HCT: 39.3 % (ref 39.0–52.0)
Hemoglobin: 12.9 g/dL — ABNORMAL LOW (ref 13.0–17.0)
Immature Granulocytes: 0 %
Lymphocytes Relative: 45 %
Lymphs Abs: 2.6 10*3/uL (ref 0.7–4.0)
MCH: 31.8 pg (ref 26.0–34.0)
MCHC: 32.8 g/dL (ref 30.0–36.0)
MCV: 96.8 fL (ref 80.0–100.0)
Monocytes Absolute: 0.6 10*3/uL (ref 0.1–1.0)
Monocytes Relative: 10 %
Neutro Abs: 2.4 10*3/uL (ref 1.7–7.7)
Neutrophils Relative %: 41 %
Platelets: 163 10*3/uL (ref 150–400)
RBC: 4.06 MIL/uL — ABNORMAL LOW (ref 4.22–5.81)
RDW: 15.1 % (ref 11.5–15.5)
WBC: 5.7 10*3/uL (ref 4.0–10.5)
nRBC: 0 % (ref 0.0–0.2)

## 2022-05-24 LAB — GLUCOSE, CAPILLARY
Glucose-Capillary: 126 mg/dL — ABNORMAL HIGH (ref 70–99)
Glucose-Capillary: 142 mg/dL — ABNORMAL HIGH (ref 70–99)
Glucose-Capillary: 188 mg/dL — ABNORMAL HIGH (ref 70–99)
Glucose-Capillary: 181 mg/dL — ABNORMAL HIGH (ref 70–99)

## 2022-05-24 LAB — CK: Total CK: 5789 U/L — ABNORMAL HIGH (ref 49–397)

## 2022-05-24 LAB — MAGNESIUM: Magnesium: 1.8 mg/dL (ref 1.7–2.4)

## 2022-05-24 MED ORDER — FUROSEMIDE 10 MG/ML IJ SOLN
40.0000 mg | Freq: Once | INTRAMUSCULAR | Status: AC
  Administered 2022-05-24: 40 mg via INTRAVENOUS
  Filled 2022-05-24: qty 4

## 2022-05-24 MED ORDER — FUROSEMIDE 10 MG/ML IJ SOLN
40.0000 mg | Freq: Once | INTRAMUSCULAR | Status: AC
  Administered 2022-05-25: 40 mg via INTRAVENOUS
  Filled 2022-05-24: qty 4

## 2022-05-24 NOTE — Progress Notes (Signed)
Progress Note    Peter Mullen   YOV:[id number]  DOB: 04-24-1932  DOA: 05/20/2022     3 PCP: Haywood Pao, MD  Initial CC: Weakness, fall at home  Hospital Course: Peter Mullen is a pleasant 88 y.o. male with PMH history significant for CAD s/p CABG, HTN, HLD, DM II, CKD 3B who presented to the ER after being found at home on the floor.  He had become progressively more weak and slid off the bed onto the floor and was unable to get up.  He was reported to have been on the floor for approximately 8 hours (hardwood floor).  He had began developing generalized weakness and body aches for approximately 2 to 3 days prior to admission.  He endorsed having already gotten his flu vaccine for the season earlier in October as well.  On work-up he was found to be positive for influenza A.  He was started on fluids and Tamiflu.  Further work-up also revealed elevated CK, 3755.  He was admitted for further work-up and monitoring.  Interval History:  No events overnight.  Less short of breath when seen this morning after receiving Lasix.  Diuresing very well.  Reviewed labs bedside with patient and family again this morning. Hopeful for discharging home tomorrow if further improvement in CK.  Assessment and Plan: * Infection due to novel influenza A virus - Symptoms started around 05/18/2022 with body aches and generalized malaise - Influenza A positive on admission - Continue Tamiflu, renally adjusted - Continue droplet precautions  Rhabdomyolysis - initial CK 3755, peaked at [place] on 10/26 - continue IVF and trend CK daily - some transient elevation of CK 10/28 (confirmed on repeat) - continue IVF and trending CK; okay for intermittent lasix given some crackles/SOB from the fluids  Stage 3b chronic kidney disease (CKD) ([place]) - patient has history of CKD3b. Baseline creat ~ 1.4 - 1.6, eGFR 40 -Currently at baseline - Does have some risk for pigment nephropathy from rhabdo if CK  continues to uptrend - Continue fluids and trending renal function   Physical deconditioning - Due to underlying infection with flu.  Patient was too weak to get up off the floor after falling - He is tentatively hoping for going home at discharge - Follow-up PT/OT eval: Ambulating well with therapy with no PT or OT needs at discharge  Sinus bradycardia - Remains on low-dose Toprol - Also scheduled to see EP on 05/26/2022  Heart failure with mildly reduced ejection fraction (HFmrEF) (HCC) - Continue aspirin, statin - Continue Toprol - Hold Jardiance until renal function further improves - Hold olmesartan for now  Prolonged QT interval - QTc 552 on EKG -Caution with prolonging agents  HTN (hypertension) - Continue current regimen  DM2 (diabetes mellitus, type 2) (HCC) - Continue SSI and CBG monitoring  CAD in native artery - Continue aspirin, Lipitor   Old records reviewed in assessment of this patient  Antimicrobials:   DVT prophylaxis:  heparin injection 5,000 Units Start: 05/21/22 0600   Code Status:   Code Status: Full Code  Mobility Assessment (last 72 hours)     Mobility Assessment     Row Name 05/24/22 0938 05/23/22 2030 05/23/22 1000 05/22/22 2030 05/22/22 1300   Does patient have an order for bedrest or is patient medically unstable No - Continue assessment No - Continue assessment No - Continue assessment No - Continue assessment --   What is the highest level of mobility based on the  progressive mobility assessment? Level 5 (Walks with assist in room/hall) - Balance while stepping forward/back and can walk in room with assist - Complete Level 5 (Walks with assist in room/hall) - Balance while stepping forward/back and can walk in room with assist - Complete Level 5 (Walks with assist in room/hall) - Balance while stepping forward/back and can walk in room with assist - Complete Level 5 (Walks with assist in room/hall) - Balance while stepping forward/back  and can walk in room with assist - Complete Level 5 (Walks with assist in room/hall) - Balance while stepping forward/back and can walk in room with assist - Complete    Row Name 05/22/22 0900 05/22/22 0800 05/21/22 2005       Does patient have an order for bedrest or is patient medically unstable -- No - Continue assessment No - Continue assessment     What is the highest level of mobility based on the progressive mobility assessment? Level 5 (Walks with assist in room/hall) - Balance while stepping forward/back and can walk in room with assist - Complete Level 5 (Walks with assist in room/hall) - Balance while stepping forward/back and can walk in room with assist - Complete Level 4 (Walks with assist in room) - Balance while marching in place and cannot step forward and back - Complete              Barriers to discharge:  Disposition Plan: Pending PT eval Status is: Inpatient  Objective: Blood pressure 135/74, pulse (!) 54, temperature 97.8 F (36.6 C), temperature source Oral, resp. rate 18, height _0  (1.676 m), weight 90 kg, SpO2 98 %.  Examination:  Physical Exam Constitutional:      General: He is not in acute distress.    Appearance: Normal appearance.     Comments: Generalized weakness appreciated  HENT:     Head: Normocephalic and atraumatic.     Mouth/Throat:     Mouth: Mucous membranes are moist.  Eyes:     Extraocular Movements: Extraocular movements intact.  Cardiovascular:     Rate and Rhythm: Normal rate and regular rhythm.     Heart sounds: Normal heart sounds.  Pulmonary:     Effort: Pulmonary effort is normal. No respiratory distress.     Breath sounds: No wheezing.     Comments: Mild crackles noted Abdominal:     General: Bowel sounds are normal. There is no distension.     Palpations: Abdomen is soft.     Tenderness: There is no abdominal tenderness.  Musculoskeletal:        General: Normal range of motion.     Cervical back: Normal range of  motion and neck supple.  Skin:    General: Skin is warm and dry.  Neurological:     General: No focal deficit present.     Mental Status: He is alert.  Psychiatric:        Mood and Affect: Mood normal.        Behavior: Behavior normal.      Consultants:    Procedures:    Data Reviewed: Results for orders placed or performed during the hospital encounter of 05/20/22 (from the past 24 hour(s))  Glucose, capillary     Status: Abnormal   Collection Time: 05/23/22  5:40 PM  Result Value Ref Range   Glucose-Capillary 128 (H) 70 - 99 mg/dL  Glucose, capillary     Status: Abnormal   Collection Time: 05/23/22  9:03 PM  Result Value  Ref Range   Glucose-Capillary 192 (H) 70 - 99 mg/dL  CBC with Differential/Platelet     Status: Abnormal   Collection Time: 05/24/22  5:02 AM  Result Value Ref Range   WBC 5.7 4.0 - 10.5 K/uL   RBC 4.06 (L) 4.22 - 5.81 MIL/uL   Hemoglobin 12.9 (L) 13.0 - 17.0 g/dL   HCT 39.3 39.0 - 52.0 %   MCV 96.8 80.0 - 100.0 fL   MCH 31.8 26.0 - 34.0 pg   MCHC 32.8 30.0 - 36.0 g/dL   RDW 15.1 11.5 - 15.5 %   Platelets 163 150 - 400 K/uL   nRBC 0.0 0.0 - 0.2 %   Neutrophils Relative % 41 %   Neutro Abs 2.4 1.7 - 7.7 K/uL   Lymphocytes Relative 45 %   Lymphs Abs 2.6 0.7 - 4.0 K/uL   Monocytes Relative 10 %   Monocytes Absolute 0.6 0.1 - 1.0 K/uL   Eosinophils Relative 3 %   Eosinophils Absolute 0.2 0.0 - 0.5 K/uL   Basophils Relative 1 %   Basophils Absolute 0.0 0.0 - 0.1 K/uL   Immature Granulocytes 0 %   Abs Immature Granulocytes 0.02 0.00 - 0.07 K/uL  Magnesium     Status: None   Collection Time: 05/24/22  5:02 AM  Result Value Ref Range   Magnesium 1.8 1.7 - 2.4 mg/dL  CK     Status: Abnormal   Collection Time: 05/24/22  5:02 AM  Result Value Ref Range   Total CK 5,789 (H) 49 - 397 U/L  Comprehensive metabolic panel     Status: Abnormal   Collection Time: 05/24/22  5:02 AM  Result Value Ref Range   Sodium 138 135 - 145 mmol/L   Potassium 3.6  3.5 - 5.1 mmol/L   Chloride 111 98 - 111 mmol/L   CO2 23 22 - 32 mmol/L   Glucose, Bld 132 (H) 70 - 99 mg/dL   BUN 17 8 - 23 mg/dL   Creatinine, Ser 1.16 0.61 - 1.24 mg/dL   Calcium 8.2 (L) 8.9 - 10.3 mg/dL   Total Protein 5.6 (L) 6.5 - 8.1 g/dL   Albumin 2.8 (L) 3.5 - 5.0 g/dL   AST 206 (H) 15 - 41 U/L   ALT 82 (H) 0 - 44 U/L   Alkaline Phosphatase 72 38 - 126 U/L   Total Bilirubin 0.8 0.3 - 1.2 mg/dL   GFR, Estimated >60 >60 mL/min   Anion gap 4 (L) 5 - 15  Glucose, capillary     Status: Abnormal   Collection Time: 05/24/22  7:51 AM  Result Value Ref Range   Glucose-Capillary 126 (H) 70 - 99 mg/dL  Glucose, capillary     Status: Abnormal   Collection Time: 05/24/22 12:05 PM  Result Value Ref Range   Glucose-Capillary 142 (H) 70 - 99 mg/dL    I have Reviewed nursing notes, Vitals, and Lab results since pt's last encounter. Pertinent lab results : see above I have ordered test including BMP, CBC, Mg I have reviewed the last note from staff over past 24 hours I have discussed pt's care plan and test results with nursing staff, case manager   LOS: 3 days   Dwyane Dee, MD [facility] 05/24/2022, 3:36 PM

## 2022-05-25 DIAGNOSIS — J09X2 Influenza due to identified novel influenza A virus with other respiratory manifestations: Secondary | ICD-10-CM | POA: Diagnosis not present

## 2022-05-25 DIAGNOSIS — R5381 Other malaise: Secondary | ICD-10-CM | POA: Diagnosis not present

## 2022-05-25 DIAGNOSIS — M6282 Rhabdomyolysis: Secondary | ICD-10-CM | POA: Diagnosis not present

## 2022-05-25 LAB — CBC WITH DIFFERENTIAL/PLATELET
Abs Immature Granulocytes: 0.04 10*3/uL (ref 0.00–0.07)
Basophils Absolute: 0 10*3/uL (ref 0.0–0.1)
Basophils Relative: 0 %
Eosinophils Absolute: 0.2 10*3/uL (ref 0.0–0.5)
Eosinophils Relative: 4 %
HCT: 40.5 % (ref 39.0–52.0)
Hemoglobin: 13.3 g/dL (ref 13.0–17.0)
Immature Granulocytes: 1 %
Lymphocytes Relative: 47 %
Lymphs Abs: 3 10*3/uL (ref 0.7–4.0)
MCH: 31.7 pg (ref 26.0–34.0)
MCHC: 32.8 g/dL (ref 30.0–36.0)
MCV: 96.7 fL (ref 80.0–100.0)
Monocytes Absolute: 0.6 10*3/uL (ref 0.1–1.0)
Monocytes Relative: 10 %
Neutro Abs: 2.4 10*3/uL (ref 1.7–7.7)
Neutrophils Relative %: 38 %
Platelets: 184 10*3/uL (ref 150–400)
RBC: 4.19 MIL/uL — ABNORMAL LOW (ref 4.22–5.81)
RDW: 15.2 % (ref 11.5–15.5)
WBC: 6.3 10*3/uL (ref 4.0–10.5)
nRBC: 0 % (ref 0.0–0.2)

## 2022-05-25 LAB — CK: Total CK: 2755 U/L — ABNORMAL HIGH (ref 49–397)

## 2022-05-25 LAB — COMPREHENSIVE METABOLIC PANEL
ALT: 78 U/L — ABNORMAL HIGH (ref 0–44)
AST: 153 U/L — ABNORMAL HIGH (ref 15–41)
Albumin: 2.7 g/dL — ABNORMAL LOW (ref 3.5–5.0)
Alkaline Phosphatase: 72 U/L (ref 38–126)
Anion gap: 6 (ref 5–15)
BUN: 13 mg/dL (ref 8–23)
CO2: 24 mmol/L (ref 22–32)
Calcium: 8.4 mg/dL — ABNORMAL LOW (ref 8.9–10.3)
Chloride: 109 mmol/L (ref 98–111)
Creatinine, Ser: 1.02 mg/dL (ref 0.61–1.24)
GFR, Estimated: >60 mL/min (ref >60)
Glucose, Bld: 132 mg/dL — ABNORMAL HIGH (ref 70–99)
Potassium: 3.6 mmol/L (ref 3.5–5.1)
Sodium: 139 mmol/L (ref 135–145)
Total Bilirubin: 0.7 mg/dL (ref 0.3–1.2)
Total Protein: 5.8 g/dL — ABNORMAL LOW (ref 6.5–8.1)

## 2022-05-25 LAB — GLUCOSE, CAPILLARY: Glucose-Capillary: 132 mg/dL — ABNORMAL HIGH (ref 70–99)

## 2022-05-25 LAB — MAGNESIUM: Magnesium: 1.4 mg/dL — ABNORMAL LOW (ref 1.7–2.4)

## 2022-05-25 MED ORDER — POTASSIUM CHLORIDE CRYS ER 20 MEQ PO TBCR
40.0000 meq | EXTENDED_RELEASE_TABLET | Freq: Once | ORAL | Status: AC
  Administered 2022-05-25: 40 meq via ORAL
  Filled 2022-05-25: qty 2

## 2022-05-25 MED ORDER — MAGNESIUM SULFATE 4 GM/100ML IV SOLN
4.0000 g | Freq: Once | INTRAVENOUS | Status: AC
  Administered 2022-05-25: 4 g via INTRAVENOUS
  Filled 2022-05-25: qty 100

## 2022-05-25 NOTE — Progress Notes (Signed)
Patient discharged home, discharge instructions given and explained to patient/son, they verbalized understanding, patient denies any pain/distress, no pressure injury noted. Accompanied home by son.

## 2022-05-25 NOTE — Discharge Summary (Signed)
Physician Discharge Summary   Peter Mullen CWC:[id number] DOB: [birth year] DOA: 05/20/2022  PCP: Haywood Pao, MD  Admit date: 05/20/2022 Discharge date: 05/25/2022  Barriers to discharge: none  Admitted From: Home Disposition:  Home Discharging physician: Dwyane Dee, MD  Recommendations for Outpatient Follow-up:  Repeat CK and BMP at follow up (trending below in labs)  Home Health:  Equipment/Devices:   Discharge Condition: stable CODE STATUS: Full Diet recommendation:  Diet Orders (From admission, onward)     Start     Ordered   05/25/22 0000  Diet general        05/25/22 1030   05/21/22 0407  Diet regular Room service appropriate? Yes; Fluid consistency: Thin  Diet effective now       Question Answer Comment  Room service appropriate? Yes   Fluid consistency: Thin      05/21/22 0406            Hospital Course: Peter Mullen is a pleasant 88 y.o. male with PMH history significant for CAD s/p CABG, HTN, HLD, DM II, CKD 3B who presented to the ER after being found at home on the floor.  He had become progressively more weak and slid off the bed onto the floor and was unable to get up.  He was reported to have been on the floor for approximately 8 hours (hardwood floor).  He had began developing generalized weakness and body aches for approximately 2 to 3 days prior to admission.  He endorsed having already gotten his flu vaccine for the season earlier in October as well.  On work-up he was found to be positive for influenza A.  He was started on fluids and Tamiflu.  Further work-up also revealed elevated CK, 3755.  He was admitted for further work-up and monitoring. His CK was trended while on fluids.  He did have gradual improvement of CK after it peaked.  He developed no renal dysfunction and creatinine showed gradual improvement from his baseline while on fluids.  Due to some shortness of breath from IV fluids, he was also treated with Lasix with good  response.  He was resumed back on home Lasix dosing at discharge with instructions to continue maintaining good hydration and pursuing outpatient lab work repeat at follow-up to ensure further improvement and stability. He was evaluated by PT and OT also with no needs or recommendations at discharge.  Assessment and Plan: * Infection due to novel influenza A virus-resolved as of 05/25/2022 - Symptoms started around 05/18/2022 with body aches and generalized malaise - Influenza A positive on admission - Continue Tamiflu, renally adjusted - Continue droplet precautions -Course of Tamiflu completed during hospitalization  Rhabdomyolysis - initial CK 3755, peaked at [place] on 10/26 - continue IVF and trend CK daily - some transient elevation of CK 10/28 (confirmed on repeat) -CK did continue to downtrend with further monitoring - He will need repeat CK and BMP at follow-up  Stage 3b chronic kidney disease (CKD) ([place]) - patient has history of CKD3b. Baseline creat ~ 1.4 - 1.6, eGFR 40 -Currently at baseline - Does have some risk for pigment nephropathy from rhabdo if CK continues to uptrend -Renal function showed continual improvement while on fluids   Physical deconditioning-resolved as of 05/25/2022 - Due to underlying infection with flu.  Patient was too weak to get up off the floor after falling - He is tentatively hoping for going home at discharge - Follow-up PT/OT eval: Ambulating well with therapy with no  PT or OT needs at discharge  Sinus bradycardia - Remains on low-dose Toprol - Also scheduled to see EP on 05/26/2022  Heart failure with mildly reduced ejection fraction (HFmrEF) (HCC) - Continue aspirin, statin - Continue Toprol -Resume Jardiance and ARB at discharge  Prolonged QT interval - QTc 552 on EKG -Caution with prolonging agents  HTN (hypertension) - Continue current regimen  DM2 (diabetes mellitus, type 2) ([place]) - resume home regimen  CAD in native  artery - Continue aspirin, Lipitor       The patient's chronic medical conditions were treated accordingly per the patient's home medication regimen except as noted.  On day of discharge, patient was felt deemed stable for discharge. Patient/family member advised to call PCP or come back to ER if needed.   Principal Diagnosis: Infection due to novel influenza A virus  Discharge Diagnoses: Active Hospital Problems   Diagnosis Date Noted   Rhabdomyolysis 05/21/2022    Priority: 2.   Stage 3b chronic kidney disease (CKD) ([place]) 05/21/2022    Priority: 3.   Prolonged QT interval 05/21/2022   Heart failure with mildly reduced ejection fraction (HFmrEF) ([place]) 05/21/2022   Sinus bradycardia 05/21/2022   HTN (hypertension) 07/05/2013   DM2 (diabetes mellitus, type 2) ([place]) 12/28/2012   CAD in native artery 12/28/2012    Resolved Hospital Problems   Diagnosis Date Noted Date Resolved   Infection due to novel influenza A virus 05/20/2022 05/25/2022    Priority: 1.   Physical deconditioning 05/21/2022 05/25/2022    Priority: 4.     Discharge Instructions     Diet general   Complete by: As directed    Increase activity slowly   Complete by: As directed       Allergies as of 05/25/2022       Reactions   Codeine Other (See Comments)   Unknown - years ago   Cyclobenzaprine Nausea And Vomiting   Patient unsure   Nsaids Other (See Comments)   Upset stomach        Medication List     STOP taking these medications    diphenhydrAMINE 25 MG tablet Commonly known as: BENADRYL       TAKE these medications    acetaminophen 500 MG tablet Commonly known as: TYLENOL Take 2 tablets by mouth at bedtime.   allopurinol 300 MG tablet Commonly known as: ZYLOPRIM Take 300 mg by mouth daily.   aspirin 81 MG tablet Take 81 mg by mouth every evening.   atorvastatin 40 MG tablet Commonly known as: LIPITOR TAKE 1/2 (ONE-HALF) TABLET BY MOUTH ONCE DAILY AT  6PM What changed:  See the new instructions.   cetirizine 10 MG tablet Commonly known as: ZYRTEC Take 10 mg by mouth at bedtime.   colchicine 0.6 MG tablet Take 0.6 mg by mouth daily as needed (gout flares).   empagliflozin 25 MG Tabs tablet Commonly known as: JARDIANCE Take 12.5 mg by mouth daily.   fluticasone 50 MCG/ACT nasal spray Commonly known as: FLONASE Place 1 spray into both nostrils every evening.   furosemide 40 MG tablet Commonly known as: LASIX Take 1 tablet by mouth once daily What changed: when to take this   Guaifenesin 1200 MG Tb12 Take 1,200 mg by mouth at bedtime.   icosapent Ethyl 1 g capsule Commonly known as: Vascepa Take 2 capsules (2 g total) by mouth 2 (two) times daily.   Melatonin 10 MG Tabs Take 10 mg by mouth at bedtime.   metFORMIN 500  MG tablet Commonly known as: GLUCOPHAGE Take 1 tablet by mouth 2 (two) times daily with a meal.   metoprolol succinate 25 MG 24 hr tablet Commonly known as: TOPROL-XL Take 1/2 (one-half) tablet by mouth once daily What changed: See the new instructions.   montelukast 10 MG tablet Commonly known as: SINGULAIR Take 10 mg by mouth at bedtime.   nitroGLYCERIN 0.4 MG SL tablet Commonly known as: NITROSTAT Place 1 tablet under the tongue daily as needed for chest pain.   olmesartan 20 MG tablet Commonly known as: BENICAR Take 1 tablet by mouth once daily What changed: when to take this   omeprazole 20 MG capsule Commonly known as: PRILOSEC Take 20 mg by mouth 2 (two) times daily before a meal.   oxymetazoline 0.05 % nasal spray Commonly known as: AFRIN Place 1 spray into both nostrils at bedtime.   Testosterone 20.25 MG/1.25GM (1.62%) Gel Place 2 Pump onto the skin in the morning and at bedtime.        Allergies  Allergen Reactions   Codeine Other (See Comments)    Unknown - years ago    Cyclobenzaprine Nausea And Vomiting    Patient unsure   Nsaids Other (See Comments)    Upset stomach      Consultations:   Procedures:   Discharge Exam: BP (!) 152/77   Pulse 71   Temp (!) 97.3 F (36.3 C) (Oral)   Resp 19   Ht _0  (1.676 m)   Wt 89.8 kg   SpO2 97%   BMI 31.96 kg/m  Physical Exam Constitutional:      General: He is not in acute distress.    Appearance: Normal appearance.     Comments: Generalized weakness appreciated  HENT:     Head: Normocephalic and atraumatic.     Mouth/Throat:     Mouth: Mucous membranes are moist.  Eyes:     Extraocular Movements: Extraocular movements intact.  Cardiovascular:     Rate and Rhythm: Normal rate and regular rhythm.     Heart sounds: Normal heart sounds.  Pulmonary:     Effort: Pulmonary effort is normal. No respiratory distress.     Breath sounds: No wheezing.     Comments: Mild crackles noted Abdominal:     General: Bowel sounds are normal. There is no distension.     Palpations: Abdomen is soft.     Tenderness: There is no abdominal tenderness.  Musculoskeletal:        General: Normal range of motion.     Cervical back: Normal range of motion and neck supple.  Skin:    General: Skin is warm and dry.  Neurological:     General: No focal deficit present.     Mental Status: He is alert.  Psychiatric:        Mood and Affect: Mood normal.        Behavior: Behavior normal.      The results of significant diagnostics from this hospitalization (including imaging, microbiology, ancillary and laboratory) are listed below for reference.   Microbiology: Recent Results (from the past 240 hour(s))  Resp Panel by RT-PCR (Flu A&B, Covid) Anterior Nasal Swab     Status: Abnormal   Collection Time: 05/20/22  6:31 PM   Specimen: Anterior Nasal Swab  Result Value Ref Range Status   SARS Coronavirus 2 by RT PCR NEGATIVE NEGATIVE Final    Comment: (NOTE) SARS-CoV-2 target nucleic acids are NOT DETECTED.  The SARS-CoV-2 RNA is  generally detectable in upper respiratory specimens during the acute phase of infection.  The lowest concentration of SARS-CoV-2 viral copies this assay can detect is 138 copies/mL. A negative result does not preclude SARS-Cov-2 infection and should not be used as the sole basis for treatment or other patient management decisions. A negative result may occur with  improper specimen collection/handling, submission of specimen other than nasopharyngeal swab, presence of viral mutation(s) within the areas targeted by this assay, and inadequate number of viral copies(<138 copies/mL). A negative result must be combined with clinical observations, patient history, and epidemiological information. The expected result is Negative.  Fact Sheet for Patients:  EntrepreneurPulse.com.au  Fact Sheet for Healthcare Providers:  IncredibleEmployment.be  This test is no t yet approved or cleared by the [place] FDA and  has been authorized for detection and/or diagnosis of SARS-CoV-2 by FDA under an Emergency Use Authorization (EUA). This EUA will remain  in effect (meaning this test can be used) for the duration of the COVID-19 declaration under Section 564(b)(1) of the Act, 21 U.S.C.section 360bbb-3(b)(1), unless the authorization is terminated  or revoked sooner.       Influenza A by PCR POSITIVE (A) NEGATIVE Final   Influenza B by PCR NEGATIVE NEGATIVE Final    Comment: (NOTE) The Xpert Xpress SARS-CoV-2/FLU/RSV plus assay is intended as an aid in the diagnosis of influenza from Nasopharyngeal swab specimens and should not be used as a sole basis for treatment. Nasal washings and aspirates are unacceptable for Xpert Xpress SARS-CoV-2/FLU/RSV testing.  Fact Sheet for Patients: EntrepreneurPulse.com.au  Fact Sheet for Healthcare Providers: IncredibleEmployment.be  This test is not yet approved or cleared by the [place] FDA and has been authorized for detection and/or diagnosis of SARS-CoV-2  by FDA under an Emergency Use Authorization (EUA). This EUA will remain in effect (meaning this test can be used) for the duration of the COVID-19 declaration under Section 564(b)(1) of the Act, 21 U.S.C. section 360bbb-3(b)(1), unless the authorization is terminated or revoked.  Performed at [facility], [address], [place], [place] [address]      Labs: BNP (last 3 results) No results for input(s): "BNP" in the last 8760 hours. Basic Metabolic Panel: Recent Labs  Lab 05/21/22 0438 05/22/22 0504 05/23/22 0513 05/24/22 0502 05/25/22 0627  NA 137 135 139 138 139  K 3.7 3.2* 3.4* 3.6 3.6  CL 105 106 110 111 109  CO2 23 21* _0 GLUCOSE 127* 113* 136* 132* 132*  BUN 30* 24* _1 CREATININE 1.66* 1.37* 1.22 1.16 1.02  CALCIUM 8.5* 8.2* 8.1* 8.2* 8.4*  MG 1.7 1.6* 2.1 1.8 1.4*  PHOS 3.4  --   --   --   --    Liver Function Tests: Recent Labs  Lab 05/24/22 0502 05/25/22 0627  AST 206* 153*  ALT 82* 78*  ALKPHOS 72 72  BILITOT 0.8 0.7  PROT 5.6* 5.8*  ALBUMIN 2.8* 2.7*   No results for input(s): "LIPASE", "AMYLASE" in the last 168 hours. No results for input(s): "AMMONIA" in the last 168 hours. CBC: Recent Labs  Lab 05/21/22 0438 05/22/22 0504 05/23/22 0513 05/24/22 0502 05/25/22 0627  WBC 6.3 6.2 5.1 5.7 6.3  NEUTROABS  --  2.9 2.1 2.4 2.4  HGB 14.3 14.1 13.2 12.9* 13.3  HCT 42.8 42.5 40.2 39.3 40.5  MCV 96.4 96.4 96.6 96.8 96.7  PLT 152 150 142* 163 184   Cardiac Enzymes: Recent Labs  Lab 05/22/22  3016 05/23/22 0513 05/23/22 0926 05/24/22 0502 05/25/22 0627  CKTOTAL 4,139* 6,545* 6,919* 5,789* 2,755*   BNP: Invalid input(s): "POCBNP" CBG: Recent Labs  Lab 05/24/22 0751 05/24/22 1205 05/24/22 1631 05/24/22 2051 05/25/22 0810  GLUCAP 126* 142* 188* 181* 132*   D-Dimer No results for input(s): "DDIMER" in the last 72 hours. Hgb A1c No results for input(s): "HGBA1C" in the last 72 hours. Lipid  Profile No results for input(s): "CHOL", "HDL", "LDLCALC", "TRIG", "CHOLHDL", "LDLDIRECT" in the last 72 hours. Thyroid function studies No results for input(s): "TSH", "T4TOTAL", "T3FREE", "THYROIDAB" in the last 72 hours.  Invalid input(s): "FREET3" Anemia work up No results for input(s): "VITAMINB12", "FOLATE", "FERRITIN", "TIBC", "IRON", "RETICCTPCT" in the last 72 hours. Urinalysis    Component Value Date/Time   COLORURINE YELLOW 05/20/2022 [place] 05/20/2022 1615   LABSPEC 1.029 05/20/2022 1615   PHURINE 6.0 05/20/2022 1615   GLUCOSEU >1,000 (A) 05/20/2022 1615   HGBUR LARGE (A) 05/20/2022 1615   BILIRUBINUR NEGATIVE 05/20/2022 1615   KETONESUR TRACE (A) 05/20/2022 1615   PROTEINUR >300 (A) 05/20/2022 1615   UROBILINOGEN 0.2 10/28/2013 1921   NITRITE NEGATIVE 05/20/2022 1615   LEUKOCYTESUR NEGATIVE 05/20/2022 1615   Sepsis Labs Recent Labs  Lab 05/22/22 0504 05/23/22 0513 05/24/22 0502 05/25/22 0627  WBC 6.2 5.1 5.7 6.3   Microbiology Recent Results (from the past 240 hour(s))  Resp Panel by RT-PCR (Flu A&B, Covid) Anterior Nasal Swab     Status: Abnormal   Collection Time: 05/20/22  6:31 PM   Specimen: Anterior Nasal Swab  Result Value Ref Range Status   SARS Coronavirus 2 by RT PCR NEGATIVE NEGATIVE Final    Comment: (NOTE) SARS-CoV-2 target nucleic acids are NOT DETECTED.  The SARS-CoV-2 RNA is generally detectable in upper respiratory specimens during the acute phase of infection. The lowest concentration of SARS-CoV-2 viral copies this assay can detect is 138 copies/mL. A negative result does not preclude SARS-Cov-2 infection and should not be used as the sole basis for treatment or other patient management decisions. A negative result may occur with  improper specimen collection/handling, submission of specimen other than nasopharyngeal swab, presence of viral mutation(s) within the areas targeted by this assay, and inadequate number of  viral copies(<138 copies/mL). A negative result must be combined with clinical observations, patient history, and epidemiological information. The expected result is Negative.  Fact Sheet for Patients:  EntrepreneurPulse.com.au  Fact Sheet for Healthcare Providers:  IncredibleEmployment.be  This test is no t yet approved or cleared by the [place] FDA and  has been authorized for detection and/or diagnosis of SARS-CoV-2 by FDA under an Emergency Use Authorization (EUA). This EUA will remain  in effect (meaning this test can be used) for the duration of the COVID-19 declaration under Section 564(b)(1) of the Act, 21 U.S.C.section 360bbb-3(b)(1), unless the authorization is terminated  or revoked sooner.       Influenza A by PCR POSITIVE (A) NEGATIVE Final   Influenza B by PCR NEGATIVE NEGATIVE Final    Comment: (NOTE) The Xpert Xpress SARS-CoV-2/FLU/RSV plus assay is intended as an aid in the diagnosis of influenza from Nasopharyngeal swab specimens and should not be used as a sole basis for treatment. Nasal washings and aspirates are unacceptable for Xpert Xpress SARS-CoV-2/FLU/RSV testing.  Fact Sheet for Patients: EntrepreneurPulse.com.au  Fact Sheet for Healthcare Providers: IncredibleEmployment.be  This test is not yet approved or cleared by the [place] FDA and has been authorized for detection  and/or diagnosis of SARS-CoV-2 by FDA under an Emergency Use Authorization (EUA). This EUA will remain in effect (meaning this test can be used) for the duration of the COVID-19 declaration under Section 564(b)(1) of the Act, 21 U.S.C. section 360bbb-3(b)(1), unless the authorization is terminated or revoked.  Performed at [facility], [address] [place], [place] [address]     Procedures/Studies: DG Chest 2 View  Result Date: 05/20/2022 CLINICAL DATA:  Cough and  congestion. EXAM: CHEST - 2 VIEW COMPARISON:  No relevant comparison studies are available. FINDINGS: The heart size is at the upper limits of normal status post median sternotomy and CABG. There is aortic atherosclerosis. The lungs appear clear. There is no pleural effusion or pneumothorax. Mild degenerative changes throughout the spine without evidence of acute osseous abnormality. Telemetry leads overlie the chest. IMPRESSION: No evidence of acute cardiopulmonary process. Borderline heart size status post CABG. Electronically Signed   By: Richardean Sale M.D.   On: 05/20/2022 18:54   DG Humerus Left  Result Date: 05/20/2022 CLINICAL DATA:  Injury EXAM: LEFT HUMERUS - 2+ VIEW COMPARISON:  None Available. FINDINGS: No fracture or dislocation of the humerus. Shoulder joint appears intact. IMPRESSION: No fracture or dislocation. Electronically Signed   By: Suzy Bouchard M.D.   On: 05/20/2022 16:48     Time coordinating discharge: Over 30 minutes    Dwyane Dee, MD  [facility] 05/25/2022, 4:01 PM

## 2022-05-25 NOTE — Care Management Important Message (Incomplete)
Important Message  Patient Details IM Letter given to the Patientl Name: Peter Mullen MRN: [id number] Date of Birth: [birth year]   Medicare Important Message Given:  Yes     Kerin Salen 05/25/2022, 11:48 AM

## 2022-05-26 ENCOUNTER — Ambulatory Visit: Payer: Medicare Other | Attending: Cardiology | Admitting: Cardiology

## 2022-05-26 ENCOUNTER — Encounter: Payer: Self-pay | Admitting: Cardiology

## 2022-05-26 VITALS — BP 128/72 | HR 56 | Ht 66.5 in | Wt 201.4 lb

## 2022-05-26 DIAGNOSIS — I441 Atrioventricular block, second degree: Secondary | ICD-10-CM

## 2022-05-26 DIAGNOSIS — R002 Palpitations: Secondary | ICD-10-CM

## 2022-05-26 NOTE — Progress Notes (Deleted)
Electrophysiology Office Note:    Date:  05/26/2022   ID:  Peter Mullen, DOB 23-Sep-1931, MRN [id number]  PCP:  Haywood Pao, MD  [facility] Cardiologist:  Shelva Majestic, MD  [facility] Electrophysiologist:  Vickie Epley, MD   Referring MD: Almyra Deforest, [place]   Chief Complaint: Bradycardia  History of Present Illness:    Peter Mullen is a 88 y.o. male who presents for an evaluation of bradycardia at the request of Almyra Deforest, PA-C. Their medical history includes coronary artery disease post bypass in 2001, hypertension, hyperlipidemia, diabetes, CKD 3, obstructive sleep apnea and obesity.  He also has a history of conduction system disease with Mobitz 1 noted on multiple prior EKGs.  This is required down titration of his beta-blocker regimen.  His bradycardia is asymptomatic.  No syncope or presyncope.     Past Medical History:  Diagnosis Date   Coronary artery disease    Diabetes mellitus without complication ([place])    Hyperlipidemia    Hypertension    Skin cancer    Stomach problems     Past Surgical History:  Procedure Laterality Date   CORONARY ARTERY BYPASS GRAFT  2001   x4 LIMA-LAD, SVG-PDA   PROSTATE SURGERY  1992   TONSILLECTOMY  1940   TUMOR REMOVAL  1970   fatty tissue tumor   VASECTOMY  1975    Current Medications: No outpatient medications have been marked as taking for the 05/26/22 encounter (Appointment) with Vickie Epley, MD.     Allergies:   Codeine, Cyclobenzaprine, and Nsaids   Social History   Socioeconomic History   Marital status: Widowed    Spouse name: Not on file   Number of children: Not on file   Years of education: Not on file   Highest education level: Not on file  Occupational History   Not on file  Tobacco Use   Smoking status: Never   Smokeless tobacco: Never  Vaping Use   Vaping Use: Never used  Substance and Sexual Activity   Alcohol use: Not Currently    Alcohol/week: 2.0 standard drinks of  alcohol    Types: 2 Standard drinks or equivalent per week    Comment: socially   Drug use: Not Currently   Sexual activity: Not on file  Other Topics Concern   Not on file  Social History Narrative   Not on file   Social Determinants of Health   Financial Resource Strain: Not on file  Food Insecurity: No Food Insecurity (05/21/2022)   Hunger Vital Sign    Worried About Running Out of Food in the Last Year: Never true    Ran Out of Food in the Last Year: Never true  Transportation Needs: No Transportation Needs (05/21/2022)   PRAPARE - Hydrologist (Medical): No    Lack of Transportation (Non-Medical): No  Physical Activity: Not on file  Stress: Not on file  Social Connections: Not on file     Family History: The patient's family history includes Diabetes in his mother; Heart disease in his maternal grandfather and mother; Leukemia in his brother; Lung cancer in his father; Stroke in his mother and paternal grandfather.  ROS:   Please see the history of present illness.    All other systems reviewed and are negative.  EKGs/Labs/Other Studies Reviewed:    The following studies were reviewed today:  April 13, 2022 ZIO monitor personally reviewed Heart rate 22-203, average 66 Mobitz  1 was present Rare supraventricular ectopy Frequent ventricular ectopy, 11%     Recent Labs: 05/25/2022: ALT 78; BUN 13; Creatinine, Ser 1.02; Hemoglobin 13.3; Magnesium 1.4; Platelets 184; Potassium 3.6; Sodium 139  Recent Lipid Panel    Component Value Date/Time   CHOL 101 (L) 11/25/2015 1343   TRIG 170 (H) 11/25/2015 1343   HDL 42 11/25/2015 1343   CHOLHDL 2.4 11/25/2015 1343   VLDL 34 (H) 11/25/2015 1343   LDLCALC 25 11/25/2015 1343    Physical Exam:    VS:  There were no vitals taken for this visit.    Wt Readings from Last 3 Encounters:  05/25/22 198 lb (89.8 kg)  04/27/22 188 lb 12.8 oz (85.6 kg)  03/16/22 189 lb (85.7 kg)     GEN:  *** Well nourished, well developed in no acute distress HEENT: Normal NECK: No JVD; No carotid bruits LYMPHATICS: No lymphadenopathy CARDIAC: ***RRR, no murmurs, rubs, gallops RESPIRATORY:  Clear to auscultation without rales, wheezing or rhonchi  ABDOMEN: Soft, non-tender, non-distended MUSCULOSKELETAL:  No edema; No deformity  SKIN: Warm and dry NEUROLOGIC:  Alert and oriented x 3 PSYCHIATRIC:  Normal affect       ASSESSMENT:    1. Palpitations   2. Second degree AV block, Mobitz type I    PLAN:    In order of problems listed above:   No pacemaker  Follow-up with EP on an as-needed basis      Total time spent with patient today *** minutes. This includes reviewing records, evaluating the patient and coordinating care.  Medication Adjustments/Labs and Tests Ordered: Current medicines are reviewed at length with the patient today.  Concerns regarding medicines are outlined above.  No orders of the defined types were placed in this encounter.  No orders of the defined types were placed in this encounter.    Signed, Hilton Cork. Quentin Ore, MD, [facility], [facility] 05/26/2022 5:16 AM    Electrophysiology [facility]

## 2022-05-26 NOTE — Progress Notes (Signed)
Electrophysiology Office Note:    Date:  05/26/2022   ID:  Peter Mullen, DOB [birth year], MRN [id number]  PCP:  Haywood Pao, MD  [facility] Cardiologist:  Shelva Majestic, MD  [facility] Electrophysiologist:  Vickie Epley, MD   Referring MD: Almyra Deforest, [place]   Chief Complaint: Bradycardia  History of Present Illness:    Peter Mullen is a 88 y.o. male who presents for an evaluation of bradycardia at the request of Almyra Deforest, PA-C. Their medical history includes coronary artery disease post bypass in 2001, hypertension, hyperlipidemia, diabetes, CKD 3, obstructive sleep apnea and obesity.  He also has a history of conduction system disease with Mobitz 1 noted on multiple prior EKGs.  This is required down titration of his beta-blocker regimen.  His bradycardia is asymptomatic.  No syncope or presyncope.  Today, he is accompanied by his son. He reports being recently hospitalized last week after a fall. He ended up sleeping on the floor for about 7 hours since he was unable to move. He has been back home for about 5 days. He denies any loss of consciousness. He generally only has a tendency to fall when he trips on something. He has been trying to clear the floor more at his house (moving objects out the way, removing rugs).   He is recovering from being sick a couple weeks ago. He still coughs every now and then but is overall recovering well.   He has been trying to stay active and tries to bowl at least once a week or so.   He denies any palpitations, chest pain, shortness of breath, or peripheral edema. No lightheadedness, headaches, syncope, orthopnea, or PND.    Past Medical History:  Diagnosis Date   Coronary artery disease    Diabetes mellitus without complication ([place])    Hyperlipidemia    Hypertension    Skin cancer    Stomach problems     Past Surgical History:  Procedure Laterality Date   CORONARY ARTERY BYPASS GRAFT  2001   x4 LIMA-LAD,  SVG-PDA   PROSTATE SURGERY  1992   TONSILLECTOMY  1940   TUMOR REMOVAL  1970   fatty tissue tumor   VASECTOMY  1975    Current Medications: Current Meds  Medication Sig   acetaminophen (TYLENOL) 500 MG tablet Take 2 tablets by mouth at bedtime.   allopurinol (ZYLOPRIM) 300 MG tablet Take 300 mg by mouth daily.   aspirin 81 MG tablet Take 81 mg by mouth every evening.    atorvastatin (LIPITOR) 40 MG tablet TAKE 1/2 (ONE-HALF) TABLET BY MOUTH ONCE DAILY AT  6PM (Patient taking differently: Take 20 mg by mouth daily with supper.)   cetirizine (ZYRTEC) 10 MG tablet Take 10 mg by mouth at bedtime.   colchicine 0.6 MG tablet Take 0.6 mg by mouth daily as needed (gout flares).   empagliflozin (JARDIANCE) 25 MG TABS tablet Take 12.5 mg by mouth daily.   fluticasone (FLONASE) 50 MCG/ACT nasal spray Place 1 spray into both nostrils every evening.   furosemide (LASIX) 40 MG tablet Take 1 tablet by mouth once daily (Patient taking differently: Take 40 mg by mouth every other day.)   Guaifenesin 1200 MG TB12 Take 1,200 mg by mouth at bedtime.   icosapent Ethyl (VASCEPA) 1 g capsule Take 2 capsules (2 g total) by mouth 2 (two) times daily.   Melatonin 10 MG TABS Take 10 mg by mouth at bedtime.   metFORMIN (GLUCOPHAGE) 500  MG tablet Take 1 tablet by mouth 2 (two) times daily with a meal.   metoprolol succinate (TOPROL-XL) 25 MG 24 hr tablet Take 1/2 (one-half) tablet by mouth once daily (Patient taking differently: 12.5 mg See admin instructions. Take one-half tablet daily for 2 days then skip one day, then repeat the cycle.)   montelukast (SINGULAIR) 10 MG tablet Take 10 mg by mouth at bedtime.    olmesartan (BENICAR) 20 MG tablet Take 1 tablet by mouth once daily (Patient taking differently: Take 20 mg by mouth daily with supper.)   omeprazole (PRILOSEC) 20 MG capsule Take 20 mg by mouth 2 (two) times daily before a meal.   oxymetazoline (AFRIN) 0.05 % nasal spray Place 1 spray into both nostrils at  bedtime.   Testosterone 20.25 MG/1.25GM (1.62%) GEL Place 2 Pump onto the skin in the morning and at bedtime.     Allergies:   Codeine, Cyclobenzaprine, and Nsaids   Social History   Socioeconomic History   Marital status: Widowed    Spouse name: Not on file   Number of children: Not on file   Years of education: Not on file   Highest education level: Not on file  Occupational History   Not on file  Tobacco Use   Smoking status: Never   Smokeless tobacco: Never  Vaping Use   Vaping Use: Never used  Substance and Sexual Activity   Alcohol use: Not Currently    Alcohol/week: 2.0 standard drinks of alcohol    Types: 2 Standard drinks or equivalent per week    Comment: socially   Drug use: Not Currently   Sexual activity: Not on file  Other Topics Concern   Not on file  Social History Narrative   Not on file   Social Determinants of Health   Financial Resource Strain: Not on file  Food Insecurity: No Food Insecurity (05/21/2022)   Hunger Vital Sign    Worried About Running Out of Food in the Last Year: Never true    Ran Out of Food in the Last Year: Never true  Transportation Needs: No Transportation Needs (05/21/2022)   PRAPARE - Hydrologist (Medical): No    Lack of Transportation (Non-Medical): No  Physical Activity: Not on file  Stress: Not on file  Social Connections: Not on file     Family History: The patient's family history includes Diabetes in his mother; Heart disease in his maternal grandfather and mother; Leukemia in his brother; Lung cancer in his father; Stroke in his mother and paternal grandfather.  ROS:   Please see the history of present illness.    All other systems reviewed and are negative.  EKGs/Labs/Other Studies Reviewed:    The following studies were reviewed today:  April 13, 2022 ZIO monitor personally reviewed Heart rate 22-203, average 66 Mobitz 1 was present Rare supraventricular  ectopy Frequent ventricular ectopy, 11%  EKG: 05/26/2022: EKG was not ordered.    Recent Labs: 05/25/2022: ALT 78; BUN 13; Creatinine, Ser 1.02; Hemoglobin 13.3; Magnesium 1.4; Platelets 184; Potassium 3.6; Sodium 139  Recent Lipid Panel    Component Value Date/Time   CHOL 101 (L) 11/25/2015 1343   TRIG 170 (H) 11/25/2015 1343   HDL 42 11/25/2015 1343   CHOLHDL 2.4 11/25/2015 1343   VLDL 34 (H) 11/25/2015 1343   LDLCALC 25 11/25/2015 1343    Physical Exam:    VS:  BP 128/72   Pulse (!) 56   Ht 5'  6.5" (1.689 m)   Wt 201 lb 6.4 oz (91.4 kg)   SpO2 97%   BMI 32.02 kg/m     Wt Readings from Last 3 Encounters:  05/26/22 201 lb 6.4 oz (91.4 kg)  05/25/22 198 lb (89.8 kg)  04/27/22 188 lb 12.8 oz (85.6 kg)     GEN: Well nourished, well developed in no acute distress.  Elderly HEENT: Normal NECK: No JVD; No carotid bruits LYMPHATICS: No lymphadenopathy CARDIAC: RRR, no murmurs, rubs, gallops RESPIRATORY:  Clear to auscultation without rales, wheezing or rhonchi  ABDOMEN: Soft, non-tender, non-distended MUSCULOSKELETAL:  No edema; No deformity  SKIN: Warm and dry NEUROLOGIC:  Alert and oriented x 3 PSYCHIATRIC:  Normal affect       ASSESSMENT:    1. Palpitations   2. Second degree AV block, Mobitz type I    PLAN:    In order of problems listed above:  #Palpitations Continue metoprolol succinate 12.5 mg by mouth once daily  #Bradycardia #Mobitz 1 No syncope or presyncope.  No indication for pacemaker at this time.  Follow-up with EP on an as-needed basis.  I did talk to the patient and his family during today's visit about getting a life alert bracelet/necklace to wear at home to avoid falling with an inability to reach someone for help. No pacemaker    Medication Adjustments/Labs and Tests Ordered: Current medicines are reviewed at length with the patient today.  Concerns regarding medicines are outlined above.  No orders of the defined types were  placed in this encounter.  No orders of the defined types were placed in this encounter.   I,Rachel Rivera,acting as a scribe for Vickie Epley, MD.,have documented all relevant documentation on the behalf of Vickie Epley, MD,as directed by  Vickie Epley, MD while in the presence of Vickie Epley, MD.   I, Vickie Epley, MD, have reviewed all documentation for this visit. The documentation on 05/26/22 for the exam, diagnosis, procedures, and orders are all accurate and complete.   Signed, Hilton Cork. Quentin Ore, MD, [facility], [facility] 05/26/2022 1:56 PM    Electrophysiology [facility]

## 2022-05-26 NOTE — Patient Instructions (Signed)
Medication Instructions:  No changes *If you need a refill on your cardiac medications before your next appointment, please call your pharmacy*   Lab Work: None  If you have labs (blood work) drawn today and your tests are completely normal, you will receive your results only by: [place] (if you have MyChart) OR A paper copy in the mail If you have any lab test that is abnormal or we need to change your treatment, we will call you to review the results.   Testing/Procedures: None    Follow-Up: At [facility], you and your health needs are our priority.  As part of our continuing mission to provide you with exceptional heart care, we have created designated Provider Care Teams.  These Care Teams include your primary Cardiologist (physician) and Advanced Practice Providers (APPs -  Physician Assistants and Nurse Practitioners) who all work together to provide you with the care you need, when you need it.  We recommend signing up for the patient portal called "MyChart".  Sign up information is provided on this After Visit Summary.  MyChart is used to connect with patients for Virtual Visits (Telemedicine).  Patients are able to view lab/test results, encounter notes, upcoming appointments, etc.  Non-urgent messages can be sent to your provider as well.   To learn more about what you can do with MyChart, go to [name].    Your next appointment:   As needed with Dr. Quentin Ore   Important Information About Sugar

## 2022-06-05 ENCOUNTER — Telehealth: Payer: Self-pay | Admitting: Cardiovascular Disease

## 2022-06-05 NOTE — Telephone Encounter (Signed)
Pt c/o BP issue: STAT if pt c/o blurred vision, one-sided weakness or slurred speech  1. What are your last 5 BP readings?  Ranging 172/105 past 4-5 days  2. Are you having any other symptoms (ex. Dizziness, headache, blurred vision, passed out)? No   3. What is your BP issue? Hypertension. Patient was recently in the hospital for influenza. Patient denies any other symptoms besides not having all his strength back yet from being sick.

## 2022-06-05 NOTE — Telephone Encounter (Signed)
Patient reports that after D/C from hospital, his BP has been 180/105, 180/83, 175/105. Denies headache, chest pain, sob. Dr Sallyanne Kuster (DOD) ordered change in met succ to 12.5 mg daily and olmesartan 19m daily. Patient wants to split the dose to 239mtwice daily. He will keep a BP diary 2 hours after BP medication and call clinic on Monday with his numbers. Recommended that if BP stays elevated or he becomes symptomatic, to be taken to the ED. Patient verbalized understanding.

## 2022-06-08 NOTE — Telephone Encounter (Addendum)
FYI. Patient walked into clinic to give BP. Saturday 160/91, Sunday 130/87, Monday 170/82. Patient showed me his BP  chart. He is taking BP every 3-5 minutes at different times throughout the day. Patient education on when to check BP. Also recommended he not split his olmesartan and to take '40mg'$  at the same time. He stated he will call clinic Wed or Thursday to report BP. Still goes bowling. Denies SOB or chest pain.

## 2022-06-20 NOTE — Telephone Encounter (Signed)
Acknowledged.

## 2022-06-30 ENCOUNTER — Other Ambulatory Visit: Payer: Self-pay | Admitting: Cardiovascular Disease

## 2022-07-09 ENCOUNTER — Telehealth: Payer: Self-pay

## 2022-07-09 NOTE — Telephone Encounter (Addendum)
Spoke with patient of Dr. Claiborne Billings - has seen EP for heart block He reports his Apple Watch indicates pulse in the 70s but Omron cuff indicates pulse in the 40s His watch indicates lowest pulse in the 30s - 630-7am while in a "deep sleep"  Dr. Quentin Ore saw him - PRN follow up  BP was elevated earlier today and he had a headache. He came home and took a nap and his BP is down.   He is taking olmesartan '20mg'$ x2 in the morning, meto succ 12.'5mg'$  daily  He is going on a cruise Tuesday 12/19 until 12/31 with his family - wants to make sure he is OK to travel  Routed to Dr. Claiborne Billings to review

## 2022-07-09 NOTE — Telephone Encounter (Signed)
Attempted to transfer to triage, but the phone just rang for 2 minutes straight. Unable to transfer.

## 2022-07-09 NOTE — Telephone Encounter (Signed)
Spoke with patient who stated he slept for an hour. His headache is nearly gone. BP 123/85, P with cuff 45. Pulse with Apple watch was 78. While on phone P with Apple watch 79. Patient will call clinic tomorrow with more VS.

## 2022-07-09 NOTE — Telephone Encounter (Signed)
Patient walked into clinic to give BP. On 12/11 at 8:35 AM he took meds. At 8:45 AM BP 127/71, P 47. Then today, took meds at 8:05 AM. At 8:45 AM BP 172/81, P 41. Explained to patient as before to check BP 2 hours after taking meds. Denies palpitations, dizziness, SOB, lightheadedness or chest pain. He has had a headache/head pressure all day and has not taken anything for it. He went bowling this AM. Denies weakness on either side of body. He is staying hydrated. He wanted to make sure he is doing okay before he goes on a family cruise. Offered to make an appointment for him to be seen in clinic, but he declined.  He stated he will go home, take Tylenol, and nap. Then he will call clinic with another BP. He is taking meds as prescribed. Recommended that in the future, he call the clinic instead of making a trip over.

## 2022-07-09 NOTE — Telephone Encounter (Signed)
Spoke with DOD, Dr. Claiborne Billings, and patient is OK to travel since bradycardia is not new. Patient aware

## 2022-07-09 NOTE — Telephone Encounter (Signed)
STAT if HR is under 50 or over 120 (normal HR is 60-100 beats per minute)  What is your heart rate?  45 Do you have a log of your heart rate readings (document readings)?   Do you have any other symptoms?   Patient is following up to report BP reading, but HR is a little low. Patient states that he took a nap and woke up at 3:40 PM, took BP at 3:55 PM. It was 123/85 45

## 2022-08-10 ENCOUNTER — Other Ambulatory Visit: Payer: Self-pay | Admitting: Cardiovascular Disease

## 2022-08-14 ENCOUNTER — Telehealth: Payer: Self-pay | Admitting: Cardiovascular Disease

## 2022-08-14 ENCOUNTER — Other Ambulatory Visit: Payer: Self-pay

## 2022-08-14 MED ORDER — OLMESARTAN MEDOXOMIL 40 MG PO TABS: 40.0000 mg | ORAL_TABLET | Freq: Every day | ORAL | 3 refills | Status: DC

## 2022-08-14 NOTE — Telephone Encounter (Signed)
Pt c/o medication issue:  1. Name of Medication: olmesartan (BENICAR) 20 MG tablet   2. How are you currently taking this medication (dosage and times per day)? One tablet daily.   3. Are you having a reaction (difficulty breathing--STAT)?   4. What is your medication issue? Patient states his dosage was changed about a month ago to '40mg'$ . He needs a new script called in to [place], [place] - [place].

## 2022-08-14 NOTE — Telephone Encounter (Signed)
Reviewed last telephone note.   Per DOD at that time- agreed to increase Olmesartan to 40 mg daily. I sent this over to the pharmacy.   Thanks!

## 2022-09-24 ENCOUNTER — Other Ambulatory Visit: Payer: Self-pay | Admitting: Cardiovascular Disease

## 2022-11-09 ENCOUNTER — Ambulatory Visit: Payer: Non-veteran care | Admitting: Cardiovascular Disease

## 2022-11-11 LAB — LAB REPORT - SCANNED
A1c: 6.6
EGFR: 33.5

## 2022-12-14 ENCOUNTER — Encounter: Payer: Self-pay | Admitting: Cardiovascular Disease

## 2022-12-14 ENCOUNTER — Ambulatory Visit: Payer: Non-veteran care | Attending: Cardiovascular Disease | Admitting: Cardiovascular Disease

## 2022-12-14 VITALS — BP 154/92 | HR 50 | Ht 66.5 in | Wt 191.0 lb

## 2022-12-14 DIAGNOSIS — I493 Ventricular premature depolarization: Secondary | ICD-10-CM

## 2022-12-14 DIAGNOSIS — E782 Mixed hyperlipidemia: Secondary | ICD-10-CM | POA: Diagnosis not present

## 2022-12-14 DIAGNOSIS — I10 Essential (primary) hypertension: Secondary | ICD-10-CM

## 2022-12-14 DIAGNOSIS — E119 Type 2 diabetes mellitus without complications: Secondary | ICD-10-CM

## 2022-12-14 DIAGNOSIS — I441 Atrioventricular block, second degree: Secondary | ICD-10-CM

## 2022-12-14 DIAGNOSIS — Z951 Presence of aortocoronary bypass graft: Secondary | ICD-10-CM

## 2022-12-14 DIAGNOSIS — I2510 Atherosclerotic heart disease of native coronary artery without angina pectoris: Secondary | ICD-10-CM

## 2022-12-14 DIAGNOSIS — Z7984 Long term (current) use of oral hypoglycemic drugs: Secondary | ICD-10-CM

## 2022-12-14 NOTE — Patient Instructions (Signed)
Medication Instructions:  Your physician recommends that you continue on your current medications as directed. Please refer to the Current Medication list given to you today.  *If you need a refill on your cardiac medications before your next appointment, please call your pharmacy*   Lab Work: None   Testing/Procedures: None   Follow-Up: At [facility], you and your health needs are our priority.  As part of our continuing mission to provide you with exceptional heart care, we have created designated Provider Care Teams.  These Care Teams include your primary Cardiologist (physician) and Advanced Practice Providers (APPs -  Physician Assistants and Nurse Practitioners) who all work together to provide you with the care you need, when you need it.   Your next appointment:    April   Provider:   Thomas Kelly, MD   

## 2022-12-14 NOTE — Progress Notes (Signed)
Patient ID: Peter Mullen, male   DOB: Feb 23, 1932, 89 y.o.   MRN: [id number]       Primary M.D.: Dr. Gerlene Burdock Tisoivic  HPI: Peter Mullen is a 89 y.o. male who presents to the office today for a 14 month  follow-up cardiology evaluation.    [name] has established CAD and in March 2001 underwent CABG revascularization surgery with a LIMA to the LAD, SVG to diagonal, SVG to the PLA, and SVG to the PDA vessel. Additional medical problems include hypertension, type 2 diabetes mellitus, mild obstructive sleep apnea not on CPAP therapy and obesity.   In October 2012 an nuclear perfusion study showed normal perfusion with a post stress ejection fraction of 62%. He has documented grade 1 diastolic dysfunction EA ratio 7.82.   A 2-D echo irevealed mild aortic sclerosis without stenosis, trace MR and trace TR. He has a history of mixed hyperlipidemia. He sees Dr. Ward Chatters for his primary medical care. Laboratory showed a normal chemistry profile, cholesterol 93, triglycerides, 110 HDL 31, and LDL 40 on his current medical regimen.  A two-year followup nuclear perfusion study on 06/30/2013 was low risk and demonstrated apical thinning with increased attenuation on resting images without significant ischemia. He did have mild LV dysfunction with an ejection fraction of 47% and focal apical inferior hypocontractility.    On 09/20/2013 a follow-up echo Doppler study showed an ejection fraction of 55% without diagnostic regional wall motion abnormalities although septal bounce was noted. He had grade 1 diastolic dysfunction. He had moderate calcification of his aortic valve without stenosis and there was moderate calcification of the mitral annulus with mildly calcified leaflets without significant regurgitation. His left atrium was mildly dilated. He had normal pulmonary pressures.  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  At that time, he had been on Toprol 100 mg daily and  I reduced this to 50 mg per day.  He has continued to take losartan 100 mg, HCTZ in addition to terazocin for hypertension.   In 2017 he had broken 2 of his molars and has been evaluated at the dental clinic at [facility].  He has been on chronic Plavix.  He had noticed that his blood pressure has been consistently elevated despite taking his vacations which include HCTZ 25 mg, losartan 100 mg, Toprol-XL 25 mg, and Hytrin 5 mg daily.  I added amlodipine 5 mg to his medical regimen for improved blood pressure control.  He had held his Plavix for dental work on his molars.   He is remaining active and typically bowls 2 times per week.  He has been having occasional pain down his left leg which may be attributable to spinal stenosis and in the past he had seen Dr. Lodema Hong for this, which had limited his walking.  He will need to have a molar extraction as well as an impacted was some tooth removed at the [facility] dental clinic, which is scheduled for later this month.  He underwent a nuclear perfusion study on 11/03/2016 in follow-up of his remote CABG surgery and this continued to be low risk and showed only a very small prior basal/mid inferoseptal defect without associated ischemia.  Ejection fraction was 55% and he had normal wall motion.    He underwent a nuclear perfusion study in April 2018, which remained low risk and showed a small defect in the basal inferoseptal and mid inferoseptal region consistent with probable scar without associated ischemia.  EF was 55%  with normal wall motion. Laboratory in June 2018 which showed total cholesterol 122, HDL 38, LDL 39, but triglycerides were elevated at 224.  Hemoglobin A1c was 6.2.  TSH was 3.25.     When I saw him in July 2019 he remained stable and was without chest pain.  He  admitted to some exertional fatigue.  He was  found to have an elevated uric acid level and was just started on allopurinol.  He has been evaluated by Dr. Darrelyn Hillock for left leg discomfort with  some left ankle swelling.  He denies any increasing shortness of breath or significant palpitations.    Since his prior evaluation he has continued to do well.  He still experiences occasional leg pain but this has improved.  He had undergone lower extremity Doppler assessment on May 06, 2018 which showed noncompressible ABIs bilaterally but he had normal toe brachial index bilaterally.  He has not had any acute gout flares.  He had recently fallen while bowling and banged his head.  He was hospitalized overnight and was found to have a small subdural hematoma.  He was assessed by Dr. Franky Macho at that time recommended holding Plavix for at least 2 weeks and okay to continue aspirin.  He has been off Plavix since his head trauma May 26, 2018.    I saw him in the office in January 2020 for follow-up Cardiologic evaluation.  At that time he was feeling well and was without chest pain he denied PND orthopnea.  He was sleeping well.  I recommended discontinuance of hydrochlorothiazide due to potential increased uric acid risk and suggested slight titration of spironolactone up to 25 mg daily.  He was evaluated in a telemedicine visit in May 2020.  At that time he was feeling well but continued to have issues with leg swelling.  He had noticed some improvement in his leg swelling with the increase in spironolactone dose but every day by midday he had developed recurrent edema.  He was using compression stockings.  He was bradycardic and his Toprol dose was reduced to 12.5 mg because of continued bilateral leg edema furosemide 20 mg was added to his regimen.  I saw him in September 2020 at which time he was continuing to feel well and denied any anginal symptomatology.  He recently  noticed some mild tenderness in the breast region which had occurred on his increase spironolactone dosing.  He had been taking Spironolactone 25 mg twice a day.  Otherwise, he feels well.  He has had frequent awakenings and has  not been sleeping as well.  Remotely he had decided against pursuing CPAP therapy with remotely diagnosed sleep apnea well.    He developed some mild breast tenderness most likely resulting from spironolactone.  I recommended he reduce his spironolactone to 12.5 mg and further titrated furosemide.  With increased glycerides I recommended Vascepa 2 capsules twice a day.   I saw him  in February 2021.  Over the prior months, he has felt well.  His blood pressure has been stable.  He underwent a follow-up sleep study on May 10, 2019 which was notable for borderline sleep apnea with an AHI of 4.8/h; however, RDI was increased at 33.2/h.  AHI while supine was 6.8/h.  O2 desaturated to 89%.  AHI during REM sleep was 4.4/h.  He has had issues with elevated triglycerides and he was on Vascepa in addition to atorvastatin 40 mg.  We discussed improved sleep hygiene.  I saw him  in September 2021.  Over the past several months, he has continued eating well.  He apparently has only been taking Vascepa 1 g twice a day instead of 2 g twice a day and atorvastatin 20 mg.  Repeat laboratory by Dr. Wylene Simmer can 2021 which showed total cholesterol 144, triglycerides elevated at 368.  LDL cholesterol was 35.  Chemistry was normal.  Uric acid was 5.8.  PSA 1.98.  He denies any chest pain or shortness of breath.  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  He is sleeping well and did not pursue CPAP.   He was evaluated by Marjie Skiff in February 2022.  He was not having any anginal symptoms.  He was on low-dose Toprol-XL and has a history of second-degree type I block and at times heart rate had dropped into the 40s.  When I saw him in August 2022 2022 he remained active and was bowling 2 times per week.  At times he has noticed that his heart rate may drop into the upper 40s particularly while sleeping.  He denied any chest pain or shortness of breath or  exertional dyspnea.  He was sleeping well but typically reads late at night and often goes to bed at 1 AM and wakes up around 10 AM.  He continues to be on atorvastatin 20 mg and Vascepa for hyperlipidemia.  He is on olmesartan 20 mg, metoprolol succinate 12.5 mg daily for blood pressure control.  He is diabetic on Jardiance and metformin.  GERD is controlled with Prilosec.  During that evaluation, his blood pressure was stable.  His ECG showed second-degree type I wenkeblock.  I recommended he reduce metoprolol succinate to 12.5 mg every other day.  He had incomplete right bundle branch block, and left anterior hemiblock as well as PVCs.  I saw him on August 27, 2021.  Since his prior evaluation he continued to feel well and denied any chest pain, presyncope or syncope or shortness of breath.  He brought with him a list of blood pressure recordings which I reviewed.  He notices his pulse in the upper 40s while sleeping but most of his heart rate is in the 60-70 range.  During that evaluation, his heart rate had increased from previously and on his ECG there was transient ventricular bigeminal rhythm.  As result I suggested a slight additional titration of metoprolol and rather take 12.5 mg every other day he will take 12.5 mg for 2 consecutive days and then not take this on the third day.  He has continued to be successful with weight loss down to 184 pounds.  I last saw him on October 01, 2021 at which time he felt improved following his medication adjustment with metoprolol tartrate.  At times blood pressure is elevated but most of the time is stable.  He continues to bowl 2 times per week.  He remains active.  He denies chest pain, exertional dyspnea, and is unaware of any lightheadedness. Evaluation, his blood pressure was stable on olmesartan 20 mg, furosemide 40 mg in addition to metoprolol succinate 12.5 mg daily.  His ECG showed rare PVC with suggestion of transient winky block.  He was asymptomatic.  He  continued to have purposeful weight loss and weight was further reduced to 181.  He continued to be on atorvastatin 20 mg and Vascepa 2 capsules twice a day for mixed hyperlipidemia.  He had stage III  CKD with creatinine 1.77 in February 2022.  Since I last saw him, he has continued to be followed by Dr. Wylene Simmer.  He had undergone laboratory on November 11, 2018.  Hemoglobin A1c was 6.6.  I did not have his laboratory results he remains active and works in garden.  He denies chest pain or shortness of breath.  He had undergone a Lexiscan Myoview study in June 2023 which was unchanged from previously and showed medium sized moderate intensity fixed basal to mid inferior, inferoseptal defect most likely scar without reversible ischemia.  EF was 47% with inferior and inferoseptal hypokinesis.  An echo Doppler study on January 20, 2022 showed EF at 45 to 50% with grade 1 diastolic dysfunction.  RV systolic function was mildly reduced.  Estimated RV systolic pressure was 40.5 mmHg.  Right atrial size was mildly dilated.  There was mild MR and mild aortic sclerosis without stenosis.  A chest x-ray on May 20, 2022 did not show any evidence for acute cardiopulmonary process.  There is borderline heart size, status post CABG.  He presents for follow-up evaluation.  Past medical history: CAD, status post CABG, hypertension, type 2 diabetes mellitus, mixed hyperlipidemia, obstructive sleep apnea, mild. He is status post removal of a a basal cell cancer removed from his left cheek by Dr. Irene Limbo.    Past surgical history notable for CABG surgery, cataract surgery 2005 prostate surgery 1989.  Current Outpatient Medications  Medication Sig Dispense Refill   acetaminophen (TYLENOL) 500 MG tablet Take 2 tablets by mouth at bedtime.     allopurinol (ZYLOPRIM) 300 MG tablet Take 300 mg by mouth daily.  5   aspirin 81 MG tablet Take 81 mg by mouth every evening.      atorvastatin (LIPITOR) 40 MG tablet TAKE 1/2 (ONE-HALF)  TABLET BY MOUTH ONCE DAILY AT  6PM 45 tablet 3   cetirizine (ZYRTEC) 10 MG tablet Take 10 mg by mouth at bedtime.     colchicine 0.6 MG tablet Take 0.6 mg by mouth daily as needed (gout flares).     empagliflozin (JARDIANCE) 25 MG TABS tablet Take 12.5 mg by mouth daily.     fluticasone (FLONASE) 50 MCG/ACT nasal spray Place 1 spray into both nostrils every evening.     Guaifenesin 1200 MG TB12 Take 1,200 mg by mouth at bedtime.     icosapent Ethyl (VASCEPA) 1 g capsule Take 2 capsules (2 g total) by mouth 2 (two) times daily. 120 capsule 2   Melatonin 10 MG TABS Take 10 mg by mouth at bedtime.     metFORMIN (GLUCOPHAGE) 500 MG tablet Take 1 tablet by mouth 2 (two) times daily with a meal.     metoprolol succinate (TOPROL-XL) 25 MG 24 hr tablet Take 0.5 tablets (12.5 mg total) by mouth daily. 45 tablet 1   montelukast (SINGULAIR) 10 MG tablet Take 10 mg by mouth at bedtime.      olmesartan (BENICAR) 40 MG tablet Take 1 tablet (40 mg total) by mouth daily. 90 tablet 3   omeprazole (PRILOSEC) 20 MG capsule Take 20 mg by mouth 2 (two) times daily before a meal.     oxymetazoline (AFRIN) 0.05 % nasal spray Place 1 spray into both nostrils at bedtime.     Testosterone 20.25 MG/1.25GM (1.62%) GEL Place 2 Pump onto the skin in the morning and at bedtime.     furosemide (LASIX) 40 MG tablet Take 1 tablet by mouth once daily 90 tablet 3  No current facility-administered medications for this visit.    He is widowed per history children 4 grandchildren. There is no tobacco use. He does drink occasional alcohol.  ROS General: Negative; No fevers, chills, or night sweats;  HEENT: Positive for cracks in his molars; No changes in vision or hearing, sinus congestion, difficulty swallowing Pulmonary: Negative; No cough, wheezing, shortness of breath, hemoptysis Cardiovascular: Negative; No chest pain, presyncope, syncope, palpitations GI: Negative; No nausea, vomiting, diarrhea, or abdominal pain GU:  Negative; No dysuria, hematuria, or difficulty voiding Musculoskeletal: Positive for back pain with leg discomfort secondary to spinal stenosis; intermittent left leg discomfort being evaluated by Dr. Worthy Rancher; left shoulder discomfort from his prior fall Rheumatologic: Gout Hematologic/Oncology: Negative; no easy bruising, bleeding Endocrine: Negative; no heat/cold intolerance; no diabetes Neuro: Negative; no changes in balance, headaches Skin: history of removal of prior basal cell carcinoma with clear margins; No rashes or skin lesions Psychiatric: Negative; No behavioral problems, depression Sleep: mild sleep apnea, not on CPAP therapy; No  daytime sleepiness, hypersomnolence, bruxism, restless legs, hypnogognic hallucinations, no cataplexy Other comprehensive 14 point system review is negative.   PE BP (!) 154/92   Pulse (!) 50   Ht 5' 6.5" (1.689 m)   Wt 191 lb (86.6 kg)   SpO2 98%   BMI 30.37 kg/m    Repeat blood pressure by me was 138/70.  Wt Readings from Last 3 Encounters:  12/14/22 191 lb (86.6 kg)  05/26/22 201 lb 6.4 oz (91.4 kg)  05/25/22 198 lb (89.8 kg)   General: Alert, oriented, no distress.  Skin: normal turgor, no rashes, warm and dry HEENT: Normocephalic, atraumatic. Pupils equal round and reactive to light; sclera anicteric; extraocular muscles intact;  Nose without nasal septal hypertrophy Mouth/Parynx benign; Mallinpatti scale 3 Neck: No JVD, no carotid bruits; normal carotid upstroke Lungs: clear to ausculatation and percussion; no wheezing or rales Chest wall: without tenderness to palpitation Heart: PMI not displaced, RRR ectopic beat, s1 s2 normal, 1/6 systolic murmur, no diastolic murmur, no rubs, gallops, thrills, or heaves Abdomen: soft, nontender; no hepatosplenomehaly, BS+; abdominal aorta nontender and not dilated by palpation. Back: no CVA tenderness Pulses 2+ Musculoskeletal: full range of motion, normal strength, no joint  deformities Extremities: no clubbing cyanosis or edema, Homan's sign negative  Neurologic: grossly nonfocal; Cranial nerves grossly wnl Psychologic: Normal mood and affect    Dec 14, 2022 ECG (independently read by me): Sinus rhythm, PVC, Mobitz I block  October 01, 2021 ECG (independently read by me):  Sinus rhythm with PVC, and transient Mobitz I block   August 27, 2021 ECG (independently read by me): Probable sinus with transient bigeminal PVC, RBBB     March 17, 2021 ECG (independently read by me):  Sinus rhythm, 2nd degree Mobitz I, PVCs, LAHB, IRBBB  September 2021 ECG (independently read by me): Sinus rhythm with PACs, first-degree AV block with PR interval 288 ms.  Left bundle branch block.  February 2021 ECG (independently read by me): Normal sinus rhythm with first-degree AV block and an isolated PVC 2015 checked intermittently at this time.   September 2020 ECG (independently read by me): Sinus rhythm 82 bpm with PACs and blocked PACs.  January 2020 ECG (independently read by me): Sinus rhythm at 79 bpm with first-degree AV block with appeared normal at 248 ms.  Left bundle branch block and left axis deviation.  July 2019 ECG (independently read by me): Sinus rhythm with first-degree AV block, frequent PVCs with transient bigeminy.  QTc interval 451 ms.  PR interval 242 ms.  November 2018 ECG (independently read by me): Difficult to discern but probable sinus rhythm with PACs  October 2017 ECG (independently read by me): Normal sinus rhythm with first-degree AV block.  PAC.  Left bundle branch block.  July 2017 ECG (independently read by me): Sinus rhythm at 64 bpm.  PACs, nonspecific interventricular block.  First-degree AV block.  April 2017 ECG (independently read by me): Normal sinus rhythm at 64 bpm with occasional PVC and sinus arrhythmia.  First-degree AV block with a PR interval at 278 ms.  Blocked APC.  January 2017 ECG (independently read by me):  Sinus rhythm  marked first-degree block.there also was a blocked APC.   He was a suggestion of possible 2nd degree Wenkebach block.  July 2016 ECG (independently read by me): Sinus rhythm with first-degree AV block with a PR interval at 248 ms.  May 2016 ECG (independently read by me): Probable 2nd degree Wenkebach block  November 2015 ECG (independently read by me): normal sinus rhythm with first-degree AV block with a PR interval at 230 milliseconds.  March 2015 ECG: (independently read by me) sinus rhythm at 73 beats per minute. First degree block with PR interval 248 ms. Isolated unifocal PVCs.   LABS:     Latest Ref Rng & Units 05/25/2022    6:27 AM 05/24/2022    5:02 AM 05/23/2022    5:13 AM  BMP  Glucose 70 - 99 mg/dL 161  096  045   BUN 8 - 23 mg/dL 13  17  23    Creatinine 0.61 - 1.24 mg/dL 4.09  8.11  9.14   Sodium 135 - 145 mmol/L 139  138  139   Potassium 3.5 - 5.1 mmol/L 3.6  3.6  3.4   Chloride 98 - 111 mmol/L 109  111  110   CO2 22 - 32 mmol/L 24  23  22    Calcium 8.9 - 10.3 mg/dL 8.4  8.2  8.1       Latest Ref Rng & Units 05/25/2022    6:27 AM 05/24/2022    5:02 AM 09/16/2020    6:01 PM  Hepatic Function  Total Protein 6.5 - 8.1 g/dL 5.8  5.6  7.1   Albumin 3.5 - 5.0 g/dL 2.7  2.8  4.0   AST 15 - 41 U/L 153  206  35   ALT 0 - 44 U/L 78  82  32   Alk Phosphatase 38 - 126 U/L 72  72  69   Total Bilirubin 0.3 - 1.2 mg/dL 0.7  0.8  1.0       Latest Ref Rng & Units 05/25/2022    6:27 AM 05/24/2022    5:02 AM 05/23/2022    5:13 AM  CBC  WBC 4.0 - 10.5 K/uL 6.3  5.7  5.1   Hemoglobin 13.0 - 17.0 g/dL 78.2  95.6  21.3   Hematocrit 39.0 - 52.0 % 40.5  39.3  40.2   Platelets 150 - 400 K/uL 184  163  142    Lab Results  Component Value Date   TSH 3.25 11/25/2015    Lab Results  Component Value Date   HGBA1C 6.8 (H) 05/21/2022    Lipid Panel     Component Value Date/Time   CHOL 101 (L) 11/25/2015 1343   TRIG 170 (H) 11/25/2015 1343   HDL 42 11/25/2015 1343    CHOLHDL 2.4 11/25/2015 1343   VLDL  34 (H) 11/25/2015 1343   LDLCALC 25 11/25/2015 1343     RADIOLOGY: No results found.  IMPRESSION:  1. Essential hypertension   2. Coronary artery disease involving native coronary artery of native heart without angina pectoris   3. S/P CABG (coronary artery bypass graft)   4. Mixed hyperlipidemia   5. Controlled type 2 diabetes mellitus without complication, without long-term current use of insulin (HCC)   6. Second degree AV block, Mobitz type I   7. PVC's (premature ventricular contractions)     ASSESSMENT AND PLAN:  [name] is an 89 year old gentleman who is status post CABG revascularization surgery in 2001. At that time, a preoperative nuclear perfusion study was done prior to him undergoing potential donor bone marrow transplantation for his brother which was abnormal and led to his catheterization and ultimate detection of significant multivessel CAD. He was asymptomatic without chest pain.  He has a history of hypertension and  has been demonstrated to have transient had second-degree AV block, type I.  He has had issues with leg edema and in the past gout.  Previously had been on spironolactone but this was  discontinued due to breast tenderness.  When I saw him in August 2022 his blood pressure was stable on his regimen of furosemide 40 mg, metoprolol succinate 12.5 mg daily and olmesartan 20 mg.  However at that time ECG showed second-degree type I Wenckebach block and as result I reduced his metoprolol succinate to 12.5 mg every other day.  When seen on August 27, 2021 his ECG showed a bigeminal rhythm.  At that time slight additional titration of metoprolol was recommended to take 12.5 mg for 2 consecutive days and then skip a dose on the third day.  He has felt improved on this therapy.  At times there is some blood pressure lability.  Repeat blood pressure by me was 134/64.  Presently, he continues to be on olmesartan 40 mg, furosemide 40  mg, and metoprolol succinate 12.5 mg daily.  He brought with him blood pressure readings at home.  These have shown blood pressure ranging from 115/78 up to 160/79.  His blood pressure today on repeat by me was 138/70.  He is asymptomatic without chest pain and continues to work in his garden.  He denies shortness of breath.  He continues to be on atorvastatin 40 mg and Vascepa 2 capsules twice a day for mixed hyperlipidemia.  Dr. Wylene Simmer had recently check laboratory.  I will try to obtain these results.  He is diabetic on metformin 500 mg twice a day in addition to Jardiance 25 mg.  He takes omeprazole for GERD.  Hemoglobin A1c was 6.6 noted on [facility] on November 11, 2022.  He will be following up with Dr. Wylene Simmer.  I reviewed his most recent echo Doppler study and Lexiscan Myoview study.  EF is 45 to 50% with grade 1 diastolic dysfunction.  There is evidence for priors scar inferiorly without ischemia.  Creatinine and October 2023 was improved at 1.02.  I will see him in 10 months for follow-up evaluation or sooner as needed.   Lennette Bihari, MD, [facility]  12/20/2022 6:03 PM

## 2022-12-18 ENCOUNTER — Other Ambulatory Visit: Payer: Self-pay | Admitting: Cardiovascular Disease

## 2022-12-20 ENCOUNTER — Encounter: Payer: Self-pay | Admitting: Cardiovascular Disease

## 2023-01-11 ENCOUNTER — Telehealth: Payer: Self-pay | Admitting: Cardiovascular Disease

## 2023-01-11 NOTE — Telephone Encounter (Signed)
Pt c/o BP issue: STAT if pt c/o blurred vision, one-sided weakness or slurred speech  1. What are your last 5 BP readings?   178/115 averaging before medication 179/106 after medication 114/79 134/74  2. Are you having any other symptoms (ex. Dizziness, headache, blurred vision, passed out)?   No  3. What is your BP issue?   Patient stated he has recently developed higher than normal BP readings since 6/10 through today (6/17).

## 2023-01-11 NOTE — Telephone Encounter (Signed)
Called patient. BP reading higher since the 10th . Patient on vacation,. Using electric scooter, not eating out of ordinary. Did not eat out often. No stress.   Today 189/124 before medication  153/109 after medication(10:28am)   181/111  (10:38)  Using Arm cuff  6/16  191/149 before medication   No after as on the road. 197/102  (11pm last night)  6/15  161/106  before medication  6/14  190/146  6/13  173/75 before medication  152/72 after medication  6/12  163/90 before medication  6/11  193/149 (9 am)  178/125 (9:10 am)  6/10  162/82  153/74  before medication  6/10  135/80 before medication    116/78 after medication  No symptoms when readings are high.  Had other readings from friends monitor and they were running high as well.   Metoprolol 12.5 mg Daily Olmesartan 40 mg Daily

## 2023-01-14 NOTE — Telephone Encounter (Signed)
Called patient, advised of message below.   Patient states his blood pressure since then has been back to normal- he will continue to monitor and if needed will call back to discuss starting medication.   Patient verbalized understanding of plan,  no further questions.

## 2023-01-19 ENCOUNTER — Other Ambulatory Visit: Payer: Self-pay | Admitting: Cardiovascular Disease

## 2023-05-08 ENCOUNTER — Emergency Department (HOSPITAL_COMMUNITY): Payer: Non-veteran care

## 2023-05-08 ENCOUNTER — Emergency Department (HOSPITAL_COMMUNITY)
Admission: EM | Admit: 2023-05-08 | Discharge: 2023-05-08 | Disposition: A | Discharge status: Home / Self Care | Payer: Non-veteran care | Attending: Emergency Medicine | Admitting: Emergency Medicine

## 2023-05-08 ENCOUNTER — Encounter (HOSPITAL_COMMUNITY): Payer: Self-pay

## 2023-05-08 DIAGNOSIS — Y9301 Activity, walking, marching and hiking: Secondary | ICD-10-CM | POA: Diagnosis not present

## 2023-05-08 DIAGNOSIS — Y9222 Religious institution as the place of occurrence of the external cause: Secondary | ICD-10-CM | POA: Insufficient documentation

## 2023-05-08 DIAGNOSIS — S0993XA Unspecified injury of face, initial encounter: Secondary | ICD-10-CM | POA: Diagnosis present

## 2023-05-08 DIAGNOSIS — S0990XA Unspecified injury of head, initial encounter: Secondary | ICD-10-CM

## 2023-05-08 DIAGNOSIS — S0181XA Laceration without foreign body of other part of head, initial encounter: Secondary | ICD-10-CM | POA: Insufficient documentation

## 2023-05-08 DIAGNOSIS — S20212A Contusion of left front wall of thorax, initial encounter: Secondary | ICD-10-CM | POA: Insufficient documentation

## 2023-05-08 DIAGNOSIS — S01112A Laceration without foreign body of left eyelid and periocular area, initial encounter: Secondary | ICD-10-CM | POA: Insufficient documentation

## 2023-05-08 DIAGNOSIS — Z23 Encounter for immunization: Secondary | ICD-10-CM | POA: Insufficient documentation

## 2023-05-08 DIAGNOSIS — W19XXXA Unspecified fall, initial encounter: Secondary | ICD-10-CM | POA: Diagnosis not present

## 2023-05-08 DIAGNOSIS — M25512 Pain in left shoulder: Secondary | ICD-10-CM | POA: Insufficient documentation

## 2023-05-08 DIAGNOSIS — Z7982 Long term (current) use of aspirin: Secondary | ICD-10-CM | POA: Insufficient documentation

## 2023-05-08 MED ORDER — LIDOCAINE-EPINEPHRINE (PF) 2 %-1:200000 IJ SOLN
10.0000 mL | Freq: Once | INTRAMUSCULAR | Status: AC
  Administered 2023-05-08: 10 mL via INTRADERMAL
  Filled 2023-05-08: qty 20

## 2023-05-08 MED ORDER — ACETAMINOPHEN 500 MG PO TABS
1000.0000 mg | ORAL_TABLET | Freq: Four times a day (QID) | ORAL | Status: DC | PRN
  Filled 2023-05-08: qty 2

## 2023-05-08 MED ORDER — TETANUS-DIPHTH-ACELL PERTUSSIS 5-2.5-18.5 LF-MCG/0.5 IM SUSY
0.5000 mL | PREFILLED_SYRINGE | Freq: Once | INTRAMUSCULAR | Status: AC
  Administered 2023-05-08: 0.5 mL via INTRAMUSCULAR
  Filled 2023-05-08: qty 0.5

## 2023-05-08 NOTE — ED Provider Notes (Signed)
[place] EMERGENCY DEPARTMENT AT [facility] Provider Note   CSN: [id number] Arrival date & time: 05/08/23  1149     History  Chief Complaint  Patient presents with   Peter Mullen is a 89 y.o. male.  Patient to ED after mechanical fall this morning while walking on level tiled surface. "My feet got tangled up". No LOC. Fall was witnessed by bystanders. He has been ambulatory since the fall. Denies nausea and vomiting. No hip or lower extremity pain. He is at his baseline mental status per family at bedside. Not anticoagulated. He complains of facial laceration over left eyebrow, left shoulder and left chest discomfort. Tetanus status is unknown.   The history is provided by the patient. No language interpreter was used.  Fall       Home Medications Prior to Admission medications   Medication Sig Start Date End Date Taking? Authorizing Provider  acetaminophen (TYLENOL) 500 MG tablet Take 2 tablets by mouth every 6 (six) hours as needed for mild pain, moderate pain, fever or headache.   Yes [provider]  allopurinol (ZYLOPRIM) 300 MG tablet Take 300 mg by mouth daily. 02/10/18  Yes [provider]  Ascorbic Acid (VITAMIN C) 1000 MG tablet Take 2,000 mg by mouth daily.   Yes [provider]  aspirin 81 MG tablet Take 81 mg by mouth every evening.    Yes [provider]  atorvastatin (LIPITOR) 40 MG tablet TAKE 1/2 (ONE-HALF) TABLET BY MOUTH ONCE DAILY AT  6PM 07/01/22  Yes Lennette Bihari, MD  Biotin 1000 MCG CHEW Chew 1 tablet by mouth every evening.   Yes [provider]  cetirizine (ZYRTEC) 10 MG tablet Take 10 mg by mouth at bedtime.   Yes [provider]  Cholecalciferol (VITAMIN D3) 250 MCG (10000 UT) capsule Take 10,000 Units by mouth daily.   Yes [provider]  colchicine 0.6 MG tablet Take 0.6 mg by mouth daily as needed (gout flares).   Yes [provider]  empagliflozin  (JARDIANCE) 25 MG TABS tablet Take 12.5 mg by mouth daily. 02/05/20  Yes [provider]  fluticasone (FLONASE) 50 MCG/ACT nasal spray Place 1 spray into both nostrils every evening.   Yes [provider]  folic acid (FOLVITE) 800 MCG tablet Take 400 mcg by mouth daily.   Yes [provider]  furosemide (LASIX) 40 MG tablet Take 1 tablet by mouth once daily 12/18/22  Yes Lennette Bihari, MD  Guaifenesin 1200 MG TB12 Take 1,200 mg by mouth at bedtime.   Yes [provider]  icosapent Ethyl (VASCEPA) 1 g capsule Take 2 capsules (2 g total) by mouth 2 (two) times daily. 04/17/20  Yes Lennette Bihari, MD  Melatonin 10 MG TABS Take 10 mg by mouth at bedtime.   Yes [provider]  metFORMIN (GLUCOPHAGE) 500 MG tablet Take 1 tablet by mouth 2 (two) times daily with a meal.   Yes [provider]  metoprolol succinate (TOPROL-XL) 25 MG 24 hr tablet Take 1/2 (one-half) tablet by mouth once daily 01/19/23  Yes Lennette Bihari, MD  montelukast (SINGULAIR) 10 MG tablet Take 10 mg by mouth at bedtime.  05/04/14  Yes [provider]  olmesartan (BENICAR) 40 MG tablet Take 1 tablet (40 mg total) by mouth daily. 08/14/22  Yes Lennette Bihari, MD  omeprazole (PRILOSEC) 20 MG capsule Take 20 mg by mouth 2 (two) times daily before  a meal.   Yes [provider]  oxymetazoline (AFRIN) 0.05 % nasal spray Place 1 spray into both nostrils at bedtime.   Yes [provider]  Testosterone 20.25 MG/1.25GM (1.62%) GEL Place 2 Pump onto the skin in the morning and at bedtime.   Yes [provider]  vitamin B-12 (CYANOCOBALAMIN) 500 MCG tablet Take 500 mcg by mouth daily.   Yes [provider]  vitamin E 180 MG (400 UNITS) capsule Take 400 Units by mouth daily.   Yes [provider]  zinc gluconate 50 MG tablet Take 50 mg by mouth daily.   Yes [provider]      Allergies    Codeine, Cyclobenzaprine, and Nsaids     Review of Systems   Review of Systems  Physical Exam Updated Vital Signs BP (!) 152/54   Pulse (!) 33   Temp (!) 97.4 F (36.3 C) (Oral)   Resp 20   SpO2 99%  Physical Exam Vitals and nursing note reviewed.  Constitutional:      Appearance: Normal appearance.  HENT:     Head: Normocephalic.     Comments: 4 cm laceration over left eyebrow. No active bleeding.  Eyes:     Extraocular Movements: Extraocular movements intact.     Pupils: Pupils are equal, round, and reactive to light.  Neck:     Comments: No midline cervical tenderness. ROM of neck full and painfree.  Cardiovascular:     Rate and Rhythm: Normal rate and regular rhythm.  Pulmonary:     Effort: Pulmonary effort is normal.     Breath sounds: No wheezing, rhonchi or rales.  Chest:     Chest wall: Tenderness (Left lateral chest tenderness without bruising.) present.  Abdominal:     Palpations: Abdomen is soft.     Tenderness: There is no abdominal tenderness.  Musculoskeletal:     Cervical back: Normal range of motion and neck supple.     Comments: FROM right upper and bilateral lower extremities. Left shoulder is tender without bony deformity. No clavicular or scapular tenderness. Elbow and wrist of the left UE nontender. No bruising or wound of the left extremity.   Skin:    General: Skin is warm and dry.  Neurological:     Mental Status: He is alert and oriented to person, place, and time.     ED Results / Procedures / Treatments   Labs (all labs ordered are listed, but only abnormal results are displayed) Labs Reviewed - No data to display  EKG None  Radiology CT Head Wo Contrast  Result Date: 05/08/2023 CLINICAL DATA:  Fall with head and neck pain. EXAM: CT HEAD WITHOUT CONTRAST CT CERVICAL SPINE WITHOUT CONTRAST TECHNIQUE: Multidetector CT imaging of the head and cervical spine was performed following the standard protocol without intravenous contrast. Multiplanar CT image reconstructions of the  cervical spine were also generated. RADIATION DOSE REDUCTION: This exam was performed according to the departmental dose-optimization program which includes automated exposure control, adjustment of the mA and/or kV according to patient size and/or use of iterative reconstruction technique. COMPARISON:  CT head dated 09/16/2020 and CT cervical spine dated 05/27/2018. FINDINGS: CT HEAD FINDINGS Brain: No evidence of acute infarction, hemorrhage, hydrocephalus, extra-axial collection or mass lesion/mass effect. There is mild cerebral volume loss with associated ex vacuo dilatation. Periventricular white matter hypoattenuation likely represents chronic small vessel ischemic disease. Vascular: There are vascular calcifications in the carotid siphons. Skull: Normal. Negative for fracture or focal  lesion. Sinuses/Orbits: There is bilateral ethmoid and maxillary sinus disease. Other: There is soft tissue swelling/laceration in the left periorbital region. CT CERVICAL SPINE FINDINGS Alignment: Normal. Skull base and vertebrae: No acute fracture. No primary bone lesion or focal pathologic process. Soft tissues and spinal canal: No prevertebral fluid or swelling. No visible canal hematoma. Disc levels:  Severe multilevel degenerative disc and joint disease. Upper chest: Negative. Other: None. IMPRESSION: 1. No acute intracranial abnormality. 2. No acute osseous injury in the cervical spine. Electronically Signed   By: Romona Curls M.D.   On: 05/08/2023 14:48   CT Cervical Spine Wo Contrast  Result Date: 05/08/2023 CLINICAL DATA:  Fall with head and neck pain. EXAM: CT HEAD WITHOUT CONTRAST CT CERVICAL SPINE WITHOUT CONTRAST TECHNIQUE: Multidetector CT imaging of the head and cervical spine was performed following the standard protocol without intravenous contrast. Multiplanar CT image reconstructions of the cervical spine were also generated. RADIATION DOSE REDUCTION: This exam was performed according to the  departmental dose-optimization program which includes automated exposure control, adjustment of the mA and/or kV according to patient size and/or use of iterative reconstruction technique. COMPARISON:  CT head dated 09/16/2020 and CT cervical spine dated 05/27/2018. FINDINGS: CT HEAD FINDINGS Brain: No evidence of acute infarction, hemorrhage, hydrocephalus, extra-axial collection or mass lesion/mass effect. There is mild cerebral volume loss with associated ex vacuo dilatation. Periventricular white matter hypoattenuation likely represents chronic small vessel ischemic disease. Vascular: There are vascular calcifications in the carotid siphons. Skull: Normal. Negative for fracture or focal lesion. Sinuses/Orbits: There is bilateral ethmoid and maxillary sinus disease. Other: There is soft tissue swelling/laceration in the left periorbital region. CT CERVICAL SPINE FINDINGS Alignment: Normal. Skull base and vertebrae: No acute fracture. No primary bone lesion or focal pathologic process. Soft tissues and spinal canal: No prevertebral fluid or swelling. No visible canal hematoma. Disc levels:  Severe multilevel degenerative disc and joint disease. Upper chest: Negative. Other: None. IMPRESSION: 1. No acute intracranial abnormality. 2. No acute osseous injury in the cervical spine. Electronically Signed   By: Romona Curls M.D.   On: 05/08/2023 14:48   DG Shoulder Left  Result Date: 05/08/2023 CLINICAL DATA:  Left-sided shoulder pain. EXAM: LEFT SHOULDER - 2+ VIEW COMPARISON:  Left humerus radiographs dated 05/20/2022. FINDINGS: There is no evidence of fracture or dislocation. Mild acromioclavicular and glenohumeral joint degenerative changes. Soft tissues are unremarkable. IMPRESSION: No acute osseous injury. Electronically Signed   By: Romona Curls M.D.   On: 05/08/2023 14:41   DG Ribs Unilateral W/Chest Left  Result Date: 05/08/2023 CLINICAL DATA:  Fall with rib pain. EXAM: LEFT RIBS AND CHEST - 3+ VIEW  COMPARISON:  Chest radiograph dated 05/20/2022. FINDINGS: No fracture or other bone lesions are seen involving the ribs. There is no evidence of pneumothorax or pleural effusion. There is mild bibasilar atelectasis. Heart size and mediastinal contours are within normal limits. IMPRESSION: No rib fractures. Electronically Signed   By: Romona Curls M.D.   On: 05/08/2023 14:41    Procedures .Marland KitchenLaceration Repair  Date/Time: 05/08/2023 3:17 PM  Performed by: Elpidio Anis, PA-C Authorized by: Elpidio Anis, PA-C   Consent:    Consent obtained:  Verbal   Consent given by:  Patient Universal protocol:    Procedure explained and questions answered to patient or proxy's satisfaction: yes     Patient identity confirmed:  Verbally with patient Anesthesia:    Anesthesia method:  Local infiltration   Local anesthetic:  Lidocaine 1% WITH epi  Laceration details:    Location:  Face   Face location:  L eyebrow   Length (cm):  4 Pre-procedure details:    Preparation:  Patient was prepped and draped in usual sterile fashion and imaging obtained to evaluate for foreign bodies Exploration:    Contaminated: no   Treatment:    Area cleansed with:  Povidone-iodine and saline   Amount of cleaning:  Standard Skin repair:    Repair method:  Sutures   Suture size:  6-0   Suture material:  Prolene   Suture technique:  Simple interrupted   Number of sutures:  12 Approximation:    Approximation:  Close Repair type:    Repair type:  Simple Post-procedure details:    Dressing:  Open (no dressing)   Procedure completion:  Tolerated well, no immediate complications     Medications Ordered in ED Medications  lidocaine-EPINEPHrine (XYLOCAINE W/EPI) 2 %-1:200000 (PF) injection 10 mL (has no administration in time range)  acetaminophen (TYLENOL) tablet 1,000 mg (has no administration in time range)  Tdap (BOOSTRIX) injection 0.5 mL (0.5 mLs Intramuscular Given 05/08/23 1455)    ED Course/ Medical  Decision Making/ A&P Clinical Course as of 05/08/23 1523  Sat May 08, 2023  1311 Elderly patient to ED after mechanical fall, tripping on level surface landing to left side here with facial laceration and left shoulder and lateral chest pain. Awake, alert, in NAD. "Hungry". Imaging including head and neck CT, left ribs w/chest and left shoulder xrays ordered. Tylenol given for discomfort.  [SU]  1515 All imaging reviewed and found to be negative. Patient's serial neuro and mental status exams unchanged. Laceration repaired as per procedure note and tetanus updated. He is ambulated in the room and able to maneuver on and off the stretcher independently, walks without imbalance. He has been seen by Dr. Rosalia Hammers and is felt appropriate for discharge home.  [SU]    Clinical Course User Index [SU] Elpidio Anis, PA-C                                 Medical Decision Making Amount and/or Complexity of Data Reviewed Radiology: ordered.  Risk OTC drugs. Prescription drug management.           Final Clinical Impression(s) / ED Diagnoses Final diagnoses:  Fall, initial encounter  Minor head injury, initial encounter  Facial laceration, initial encounter  Rib contusion, left, initial encounter    Rx / DC Orders ED Discharge Orders     None         Elpidio Anis, PA-C 05/08/23 1524    Margarita Grizzle, MD 05/09/23 [phone]

## 2023-05-08 NOTE — ED Triage Notes (Signed)
[place] [facility] d[place]t [place]. Pt was at church had mechanical fall , landed on L side. C/o L shoulder pain. No deformity noted. Abt 1in of lac on R eyebrow. No LOC. No n/v. Not sure if he takes blood thinner.   BP 161/90 HR 54  SPO 98%  CBG 167

## 2023-05-08 NOTE — Discharge Instructions (Signed)
Sutures can be removed in 4-5 days by your doctor. Go sooner with any sign of infection.   Take Tylenol (1000 mg every 6 hours for pain). Cool compresses to the sore areas may also help. Plan to take it easy for the next few days and expect to be more sore before you get better.   Return to the ED with any new or concerning symptoms at any time.

## 2023-06-28 ENCOUNTER — Other Ambulatory Visit: Payer: Self-pay | Admitting: Cardiovascular Disease

## 2023-07-14 ENCOUNTER — Other Ambulatory Visit: Payer: Self-pay | Admitting: Cardiovascular Disease

## 2023-11-15 ENCOUNTER — Encounter: Payer: Self-pay | Admitting: Cardiovascular Disease

## 2023-11-15 ENCOUNTER — Ambulatory Visit
Payer: No Typology Code available for payment source | Attending: Cardiovascular Disease | Admitting: Cardiovascular Disease

## 2023-11-15 DIAGNOSIS — I10 Essential (primary) hypertension: Secondary | ICD-10-CM

## 2023-11-15 DIAGNOSIS — I5022 Chronic systolic (congestive) heart failure: Secondary | ICD-10-CM | POA: Diagnosis not present

## 2023-11-15 DIAGNOSIS — E782 Mixed hyperlipidemia: Secondary | ICD-10-CM

## 2023-11-15 DIAGNOSIS — R001 Bradycardia, unspecified: Secondary | ICD-10-CM | POA: Diagnosis not present

## 2023-11-15 DIAGNOSIS — I441 Atrioventricular block, second degree: Secondary | ICD-10-CM

## 2023-11-15 DIAGNOSIS — E785 Hyperlipidemia, unspecified: Secondary | ICD-10-CM

## 2023-11-15 DIAGNOSIS — I440 Atrioventricular block, first degree: Secondary | ICD-10-CM

## 2023-11-15 DIAGNOSIS — G4730 Sleep apnea, unspecified: Secondary | ICD-10-CM

## 2023-11-15 NOTE — Patient Instructions (Signed)
 Medication Instructions:  NO CHANGES *If you need a refill on your cardiac medications before your next appointment, please call your pharmacy*  Lab Work: NO LABS If you have labs (blood work) drawn today and your tests are completely normal, you will receive your results only by: MyChart Message (if you have MyChart) OR A paper copy in the mail If you have any lab test that is abnormal or we need to change your treatment, we will call you to review the results.  Testing/Procedures: NO TESTING  Follow-Up: At [facility], you and your health needs are our priority.  As part of our continuing mission to provide you with exceptional heart care, our providers are all part of one team.  This team includes your primary Cardiologist (physician) and Advanced Practice Providers or APPs (Physician Assistants and Nurse Practitioners) who all work together to provide you with the care you need, when you need it.  Your next appointment:   6 month(s)  Provider:   Luana Rumple, MD    Other Instructions   1st Floor: - Lobby - Registration  - Pharmacy  - Lab - Cafe  2nd Floor: - PV Lab - Diagnostic Testing (echo, CT, nuclear med)  3rd Floor: - Vacant  4th Floor: - TCTS (cardiothoracic surgery) - AFib Clinic - Structural Heart Clinic - Vascular Surgery  - Vascular Ultrasound  5th Floor: - [facility] (general and EP) - Clinical Pharmacy for coumadin, hypertension, lipid, weight-loss medications, and med management appointments    Valet parking services will be available as well.

## 2023-11-18 ENCOUNTER — Encounter: Payer: Self-pay | Admitting: Cardiovascular Disease

## 2023-11-18 NOTE — Progress Notes (Signed)
 Patient ID: Peter Mullen, male   DOB: Nov 28, 1931, 89 y.o.   MRN: [id number]       Primary M.D.: Dr. Rich Champ Tisoivic  HPI: Peter Mullen is a 89 y.o. male who presents to the office today for an 38 month follow-up cardiology evaluation.    [name] has established CAD and in March 2001 underwent CABG revascularization surgery with a LIMA to the LAD, SVG to diagonal, SVG to the PLA, and SVG to the PDA vessel. Additional medical problems include hypertension, type 2 diabetes mellitus, mild obstructive sleep apnea not on CPAP therapy and obesity.   In October 2012 an nuclear perfusion study showed normal perfusion with a post stress ejection fraction of 62%. He has documented grade 1 diastolic dysfunction EA ratio 2.95.   A 2-D echo irevealed mild aortic sclerosis without stenosis, trace MR and trace TR. He has a history of mixed hyperlipidemia. He sees Dr. Tivosec for his primary medical care. Laboratory showed a normal chemistry profile, cholesterol 93, triglycerides, 110 HDL 31, and LDL 40 on his current medical regimen.  A two-year followup nuclear perfusion study on 06/30/2013 was low risk and demonstrated apical thinning with increased attenuation on resting images without significant ischemia. He did have mild LV dysfunction with an ejection fraction of 47% and focal apical inferior hypocontractility.    On 09/20/2013 a follow-up echo Doppler study showed an ejection fraction of 55% without diagnostic regional wall motion abnormalities although septal bounce was noted. He had grade 1 diastolic dysfunction. He had moderate calcification of his aortic valve without stenosis and there was moderate calcification of the mitral annulus with mildly calcified leaflets without significant regurgitation. His left atrium was mildly dilated. He had normal pulmonary pressures.  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  At that time, he had been on Toprol  100 mg daily and  I reduced this to 50 mg per day.  He has continued to take losartan  100 mg, HCTZ in addition to terazocin for hypertension.   In 2017 he had broken 2 of his molars and has been evaluated at the dental clinic at [facility].  He has been on chronic Plavix .  He had noticed that his blood pressure has been consistently elevated despite taking his vacations which include HCTZ 25 mg, losartan  100 mg, Toprol -XL 25 mg, and Hytrin  5 mg daily.  I added amlodipine  5 mg to his medical regimen for improved blood pressure control.  He had held his Plavix  for dental work on his molars.   He is remaining active and typically bowls 2 times per week.  He has been having occasional pain down his left leg which may be attributable to spinal stenosis and in the past he had seen Dr. Kimberly Penna for this, which had limited his walking.  He will need to have a molar extraction as well as an impacted was some tooth removed at the [facility] dental clinic, which is scheduled for later this month.  He underwent a nuclear perfusion study on 11/03/2016 in follow-up of his remote CABG surgery and this continued to be low risk and showed only a very small prior basal/mid inferoseptal defect without associated ischemia.  Ejection fraction was 55% and he had normal wall motion.    He underwent a nuclear perfusion study in April 2018, which remained low risk and showed a small defect in the basal inferoseptal and mid inferoseptal region consistent with probable scar without associated ischemia.  EF was 55% with  normal wall motion. Laboratory in June 2018 which showed total cholesterol 122, HDL 38, LDL 39, but triglycerides were elevated at 224.  Hemoglobin A1c was 6.2.  TSH was 3.25.     When I saw him in July 2019 he remained stable and was without chest pain.  He  admitted to some exertional fatigue.  He was  found to have an elevated uric acid level and was just started on allopurinol .  He has been evaluated by Dr. Dante Dyer for left leg discomfort with  some left ankle swelling.  He denies any increasing shortness of breath or significant palpitations.    Since his prior evaluation he has continued to do well.  He still experiences occasional leg pain but this has improved.  He had undergone lower extremity Doppler assessment on May 06, 2018 which showed noncompressible ABIs bilaterally but he had normal toe brachial index bilaterally.  He has not had any acute gout flares.  He had recently fallen while bowling and banged his head.  He was hospitalized overnight and was found to have a small subdural hematoma.  He was assessed by Dr. Cabbell at that time recommended holding Plavix  for at least 2 weeks and okay to continue aspirin .  He has been off Plavix  since his head trauma May 26, 2018.    I saw him in the office in January 2020 for follow-up Cardiologic evaluation.  At that time he was feeling well and was without chest pain he denied PND orthopnea.  He was sleeping well.  I recommended discontinuance of hydrochlorothiazide  due to potential increased uric acid risk and suggested slight titration of spironolactone  up to 25 mg daily.  He was evaluated in a telemedicine visit in May 2020.  At that time he was feeling well but continued to have issues with leg swelling.  He had noticed some improvement in his leg swelling with the increase in spironolactone  dose but every day by midday he had developed recurrent edema.  He was using compression stockings.  He was bradycardic and his Toprol  dose was reduced to 12.5 mg because of continued bilateral leg edema furosemide  20 mg was added to his regimen.  I saw him in September 2020 at which time he was continuing to feel well and denied any anginal symptomatology.  He recently  noticed some mild tenderness in the breast region which had occurred on his increase spironolactone  dosing.  He had been taking Spironolactone  25 mg twice a day.  Otherwise, he feels well.  He has had frequent awakenings and has  not been sleeping as well.  Remotely he had decided against pursuing CPAP therapy with remotely diagnosed sleep apnea well.    He developed some mild breast tenderness most likely resulting from spironolactone .  I recommended he reduce his spironolactone  to 12.5 mg and further titrated furosemide .  With increased glycerides I recommended Vascepa  2 capsules twice a day.   I saw him  in February 2021.  Over the prior months, he has felt well.  His blood pressure has been stable.  He underwent a follow-up sleep study on May 10, 2019 which was notable for borderline sleep apnea with an AHI of 4.8/h; however, RDI was increased at 33.2/h.  AHI while supine was 6.8/h.  O2 desaturated to 89%.  AHI during REM sleep was 4.4/h.  He has had issues with elevated triglycerides and he was on Vascepa  in addition to atorvastatin  40 mg.  We discussed improved sleep hygiene.  I saw him in  September 2021.  Over the past several months, he has continued eating well.  He apparently has only been taking Vascepa  1 g twice a day instead of 2 g twice a day and atorvastatin  20 mg.  Repeat laboratory by Dr. Tisovec can 2021 which showed total cholesterol 144, triglycerides elevated at 368.  LDL cholesterol was 35.  Chemistry was normal.  Uric acid was 5.8.  PSA 1.98.  He denies any chest pain or shortness of breath.  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  He is sleeping well and did not pursue CPAP.   He was evaluated by Sharren Decree in February 2022.  He was not having any anginal symptoms.  He was on low-dose Toprol -XL and has a history of second-degree type I block and at times heart rate had dropped into the 40s.  When I saw him in August 2022 2022 he remained active and was bowling 2 times per week.  At times he has noticed that his heart rate may drop into the upper 40s particularly while sleeping.  He denied any chest pain or shortness of breath or  exertional dyspnea.  He was sleeping well but typically reads late at night and often goes to bed at 1 AM and wakes up around 10 AM.  He continues to be on atorvastatin  20 mg and Vascepa  for hyperlipidemia.  He is on olmesartan  20 mg, metoprolol  succinate 12.5 mg daily for blood pressure control.  He is diabetic on Jardiance and metformin .  GERD is controlled with Prilosec.  During that evaluation, his blood pressure was stable.  His ECG showed second-degree type I wenkeblock.  I recommended he reduce metoprolol  succinate to 12.5 mg every other day.  He had incomplete right bundle branch block, and left anterior hemiblock as well as PVCs.  I saw him on August 27, 2021.  Since his prior evaluation he continued to feel well and denied any chest pain, presyncope or syncope or shortness of breath.  He brought with him a list of blood pressure recordings which I reviewed.  He notices his pulse in the upper 40s while sleeping but most of his heart rate is in the 60-70 range.  During that evaluation, his heart rate had increased from previously and on his ECG there was transient ventricular bigeminal rhythm.  As result I suggested a slight additional titration of metoprolol  and rather take 12.5 mg every other day he will take 12.5 mg for 2 consecutive days and then not take this on the third day.  He has continued to be successful with weight loss down to 184 pounds.  I  saw him on October 01, 2021 at which time he felt improved following his medication adjustment with metoprolol  tartrate.  At times blood pressure is elevated but most of the time is stable.  He continues to bowl 2 times per week.  He remains active.  He denies chest pain, exertional dyspnea, and is unaware of any lightheadedness. Evaluation, his blood pressure was stable on olmesartan  20 mg, furosemide  40 mg in addition to metoprolol  succinate 12.5 mg daily.  His ECG showed rare PVC with suggestion of transient winky block.  He was asymptomatic.  He  continued to have purposeful weight loss and weight was further reduced to 181.  He continued to be on atorvastatin  20 mg and Vascepa  2 capsules twice a day for mixed hyperlipidemia.  He had stage III CKD  with creatinine 1.77 in February 2022.  I last saw him on Dec 14, 2022.  He has continued to be followed by Dr. Tisovec.  He had undergone laboratory on November 11, 2018.  Hemoglobin A1c was 6.6.  I did not have his laboratory results he remains active and works in garden.  He denies chest pain or shortness of breath.  He had undergone a Lexiscan  Myoview  study in June 2023 which was unchanged from previously and showed medium sized moderate intensity fixed basal to mid inferior, inferoseptal defect most likely scar without reversible ischemia.  EF was 47% with inferior and inferoseptal hypokinesis.  An echo Doppler study on January 20, 2022 showed EF at 45 to 50% with grade 1 diastolic dysfunction.  RV systolic function was mildly reduced.  Estimated RV systolic pressure was 40.5 mmHg.  Right atrial size was mildly dilated.  There was mild MR and mild aortic sclerosis without stenosis.  A chest x-ray on May 20, 2022 did not show any evidence for acute cardiopulmonary process.  There is borderline heart size, status post CABG.    Since I last saw him he was seen in the emergency room in October 2020 for when he had a fall.  He denied any dizziness admitted and his feet got "tangled up."Presently, he continues to feel well.  He remains active for 89 year old gentleman.  There is no chest pain.  He walks regularly.  He recently had a bout of diarrhea.  He continues to be on atorvastatin  40 mg for hyperlipidemia.  He is diabetic on Jardiance morning he takes olmesartan  40 mg, metoprolol  succinate 25 mg daily, and furosemide  40 mg for blood pressure control.  He takes daily allopurinol .  He presents for evaluation.   Past medical history: CAD, status post CABG, hypertension, type 2 diabetes mellitus, mixed  hyperlipidemia, obstructive sleep apnea, mild. He is status post removal of a a basal cell cancer removed from his left cheek by Dr. Jerilynn Montenegro.    Past surgical history notable for CABG surgery, cataract surgery 2005 prostate surgery 1989.  Current Outpatient Medications  Medication Sig Dispense Refill   allopurinol  (ZYLOPRIM ) 300 MG tablet Take 300 mg by mouth daily.  5   aspirin  81 MG tablet Take 81 mg by mouth every evening.      atorvastatin  (LIPITOR) 40 MG tablet TAKE 1/2 (ONE-HALF) TABLET BY MOUTH ONCE DAILY AT  6  PM 30 tablet 6   Biotin 1000 MCG CHEW Chew 1 tablet by mouth every evening.     cetirizine (ZYRTEC) 10 MG tablet Take 10 mg by mouth at bedtime.     empagliflozin (JARDIANCE) 25 MG TABS tablet Take 12.5 mg by mouth daily.     fluticasone (FLONASE) 50 MCG/ACT nasal spray Place 1 spray into both nostrils every evening.     folic acid (FOLVITE) 800 MCG tablet Take 400 mcg by mouth daily.     furosemide  (LASIX ) 40 MG tablet Take 1 tablet by mouth once daily 90 tablet 3   Glucosamine-Chondroitin (COSAMIN DS) 500-400 MG CAPS Take 1 capsule by mouth 2 (two) times daily.     icosapent  Ethyl (VASCEPA ) 1 g capsule Take 2 capsules (2 g total) by mouth 2 (two) times daily. 120 capsule 2   Melatonin 10 MG TABS Take 10 mg by mouth at bedtime.     metFORMIN  (GLUCOPHAGE ) 500 MG tablet Take 1 tablet by mouth 2 (two) times daily with a meal.     metoprolol  succinate (TOPROL -XL) 25 MG  24 hr tablet Take 1/2 (one-half) tablet by mouth once daily 45 tablet 3   montelukast  (SINGULAIR ) 10 MG tablet Take 10 mg by mouth at bedtime.      olmesartan  (BENICAR ) 40 MG tablet Take 1 tablet by mouth once daily 90 tablet 1   omeprazole (PRILOSEC) 20 MG capsule Take 20 mg by mouth 2 (two) times daily before a meal.     Testosterone  20.25 MG/ACT (1.62%) GEL 2 pumps to skin in the morning to shoulder, upper arms or abdomen Transdermal Once a day     Vitamin D, Cholecalciferol, 25 MCG (1000 UT) CAPS Take 1 capsule  by mouth daily at 6 (six) AM.     No current facility-administered medications for this visit.    He is widowed per history children 4 grandchildren. There is no tobacco use. He does drink occasional alcohol.  ROS General: Negative; No fevers, chills, or night sweats;  HEENT: Positive for cracks in his molars; No changes in vision or hearing, sinus congestion, difficulty swallowing Pulmonary: Negative; No cough, wheezing, shortness of breath, hemoptysis Cardiovascular: Negative; No chest pain, presyncope, syncope, palpitations GI: Negative; No nausea, vomiting, diarrhea, or abdominal pain GU: Negative; No dysuria, hematuria, or difficulty voiding Musculoskeletal: Positive for back pain with leg discomfort secondary to spinal stenosis; intermittent left leg discomfort being evaluated by Dr. Joellen Muscat; left shoulder discomfort from his prior fall Rheumatologic: Gout Hematologic/Oncology: Negative; no easy bruising, bleeding Endocrine: Negative; no heat/cold intolerance; no diabetes Neuro: Negative; no changes in balance, headaches Skin: history of removal of prior basal cell carcinoma with clear margins; No rashes or skin lesions Psychiatric: Negative; No behavioral problems, depression Sleep: mild sleep apnea, not on CPAP therapy; No  daytime sleepiness, hypersomnolence, bruxism, restless legs, hypnogognic hallucinations, no cataplexy Other comprehensive 14 point system review is negative.   PE BP 110/62 (BP Location: Left Arm, Patient Position: Sitting, Cuff Size: Normal)   Pulse (!) 47   Ht 5\' 6"  (1.676 m)   Wt 190 lb (86.2 kg)   SpO2 92%   BMI 30.67 kg/m    Repeat blood pressure by me was 138/70.  Wt Readings from Last 3 Encounters:  11/15/23 190 lb (86.2 kg)  12/14/22 191 lb (86.6 kg)  05/26/22 201 lb 6.4 oz (91.4 kg)   General: Alert, oriented, no distress.  Skin: normal turgor, no rashes, warm and dry HEENT: Normocephalic, atraumatic. Pupils equal round and reactive  to light; sclera anicteric; extraocular muscles intact;  Nose without nasal septal hypertrophy Mouth/Parynx benign; Mallinpatti scale 3 Neck: No JVD, no carotid bruits; normal carotid upstroke Lungs: clear to ausculatation and percussion; no wheezing or rales Chest wall: without tenderness to palpitation Heart: PMI not displaced, RRR ectopic beat, s1 s2 normal, 1/6 systolic murmur, no diastolic murmur, no rubs, gallops, thrills, or heaves Abdomen: soft, nontender; no hepatosplenomehaly, BS+; abdominal aorta nontender and not dilated by palpation. Back: no CVA tenderness Pulses 2+ Musculoskeletal: full range of motion, normal strength, no joint deformities Extremities: no clubbing cyanosis or edema, Homan's sign negative  Neurologic: grossly nonfocal; Cranial nerves grossly wnl Psychologic: Normal mood and affect  EKG Interpretation Date/Time:  Monday November 15 2023 11:46:02 EDT Ventricular Rate:  47 PR Interval:    QRS Duration:  164 QT Interval:  568 QTC Calculation: 502 R Axis:   -63  Text Interpretation: Undetermined rhythm Right bundle branch block Left anterior fascicular block Bifascicular block Minimal voltage criteria for LVH, may be normal variant ( R in aVL ) Septal  infarct , age undetermined When compared with ECG of 15-Nov-2023 11:12, Current undetermined rhythm precludes rhythm comparison, needs review Septal infarct is now Present Confirmed by Magnus Schuller ([id number]) on 11/18/2023 3:18:04 PM     Dec 14, 2022 ECG (independently read by me): Sinus rhythm, PVC, Mobitz I block  October 01, 2021 ECG (independently read by me):  Sinus rhythm with PVC, and transient Mobitz I block   August 27, 2021 ECG (independently read by me): Probable sinus with transient bigeminal PVC, RBBB     March 17, 2021 ECG (independently read by me):  Sinus rhythm, 2nd degree Mobitz I, PVCs, LAHB, IRBBB  September 2021 ECG (independently read by me): Sinus rhythm with PACs, first-degree AV block with  PR interval 288 ms.  Left bundle branch block.  February 2021 ECG (independently read by me): Normal sinus rhythm with first-degree AV block and an isolated PVC 2015 checked intermittently at this time.   September 2020 ECG (independently read by me): Sinus rhythm 82 bpm with PACs and blocked PACs.  January 2020 ECG (independently read by me): Sinus rhythm at 79 bpm with first-degree AV block with appeared normal at 248 ms.  Left bundle branch block and left axis deviation.  July 2019 ECG (independently read by me): Sinus rhythm with first-degree AV block, frequent PVCs with transient bigeminy.  QTc interval 451 ms.  PR interval 242 ms.  November 2018 ECG (independently read by me): Difficult to discern but probable sinus rhythm with PACs  October 2017 ECG (independently read by me): Normal sinus rhythm with first-degree AV block.  PAC.  Left bundle branch block.  July 2017 ECG (independently read by me): Sinus rhythm at 64 bpm.  PACs, nonspecific interventricular block.  First-degree AV block.  April 2017 ECG (independently read by me): Normal sinus rhythm at 64 bpm with occasional PVC and sinus arrhythmia.  First-degree AV block with a PR interval at 278 ms.  Blocked APC.  January 2017 ECG (independently read by me):  Sinus rhythm marked first-degree block.there also was a blocked APC.   He was a suggestion of possible 2nd degree Wenkebach block.  July 2016 ECG (independently read by me): Sinus rhythm with first-degree AV block with a PR interval at 248 ms.  May 2016 ECG (independently read by me): Probable 2nd degree Wenkebach block  November 2015 ECG (independently read by me): normal sinus rhythm with first-degree AV block with a PR interval at 230 milliseconds.  March 2015 ECG: (independently read by me) sinus rhythm at 73 beats per minute. First degree block with PR interval 248 ms. Isolated unifocal PVCs.   LABS:     Latest Ref Rng & Units 05/25/2022    6:27 AM 05/24/2022     5:02 AM 05/23/2022    5:13 AM  BMP  Glucose 70 - 99 mg/dL 604  540  981   BUN 8 - 23 mg/dL 13  17  23    Creatinine 0.61 - 1.24 mg/dL 1.91  4.78  2.95   Sodium 135 - 145 mmol/L 139  138  139   Potassium 3.5 - 5.1 mmol/L 3.6  3.6  3.4   Chloride 98 - 111 mmol/L 109  111  110   CO2 22 - 32 mmol/L 24  23  22    Calcium  8.9 - 10.3 mg/dL 8.4  8.2  8.1       Latest Ref Rng & Units 05/25/2022    6:27 AM 05/24/2022    5:02 AM 09/16/2020  6:01 PM  Hepatic Function  Total Protein 6.5 - 8.1 g/dL 5.8  5.6  7.1   Albumin 3.5 - 5.0 g/dL 2.7  2.8  4.0   AST 15 - 41 U/L 153  206  35   ALT 0 - 44 U/L 78  82  32   Alk Phosphatase 38 - 126 U/L 72  72  69   Total Bilirubin 0.3 - 1.2 mg/dL 0.7  0.8  1.0       Latest Ref Rng & Units 05/25/2022    6:27 AM 05/24/2022    5:02 AM 05/23/2022    5:13 AM  CBC  WBC 4.0 - 10.5 K/uL 6.3  5.7  5.1   Hemoglobin 13.0 - 17.0 g/dL 63.8  75.6  43.3   Hematocrit 39.0 - 52.0 % 40.5  39.3  40.2   Platelets 150 - 400 K/uL 184  163  142    Lab Results  Component Value Date   TSH 3.25 11/25/2015    Lab Results  Component Value Date   HGBA1C 6.8 (H) 05/21/2022    Lipid Panel     Component Value Date/Time   CHOL 101 (L) 11/25/2015 1343   TRIG 170 (H) 11/25/2015 1343   HDL 42 11/25/2015 1343   CHOLHDL 2.4 11/25/2015 1343   VLDL 34 (H) 11/25/2015 1343   LDLCALC 25 11/25/2015 1343     RADIOLOGY: No results found.  IMPRESSION:  1. Essential hypertension   2. Heart failure with mildly reduced ejection fraction (HFmrEF) (HCC)   3. Sinus bradycardia   4. Second degree AV block, Mobitz type I   5. Mixed hyperlipidemia   6. Mild sleep apnea   7. Hyperlipidemia LDL goal <70     ASSESSMENT AND PLAN:  [name] is a 89 year old gentleman who is status post CABG revascularization surgery in 2001. At that time, a preoperative nuclear perfusion study was done prior to him undergoing potential donor bone marrow transplantation for his brother which was  abnormal and led to his catheterization and ultimate detection of significant multivessel CAD. He was asymptomatic without chest pain.  He has a history of hypertension and  has been demonstrated to have transient had second-degree AV block, type I.  He has had issues with leg edema and in the past gout.  Previously had been on spironolactone  but this was  discontinued due to breast tenderness.  When I saw him in August 2022 his blood pressure was stable on his regimen of furosemide  40 mg, metoprolol  succinate 12.5 mg daily and olmesartan  20 mg.  However at that time ECG showed second-degree type I Wenckebach block and as result I reduced his metoprolol  succinate to 12.5 mg every other day.  When seen on August 27, 2021 his ECG showed a bigeminal rhythm.  At that time slight additional titration of metoprolol  was recommended to take 12.5 mg for 2 consecutive days and then skip a dose on the third day.  He has felt improved on this therapy.  At times there is some blood pressure lability.  His blood pressure today is stable at 128/70 on olmesartan  40 mg, metoprolol  succinate 12.5 mg and furosemide  40 mg daily.  He is not having any dizziness.  He has remained active without chest pain and walks daily.  His ECG today does suggest possible intermittent 2-1 block.  He has right bundle branch block and left anterior hemiblock.  He continues to see Dr. Tisovec who checks laboratory.  Lipid studies in  October 2024 were suboptimal and he tells me these will be rechecked by Dr. Tisovec in the near future.  He is diabetic on Jardiance and metformin .  He continues to be on atorvastatin  40 mg for hyperlipidemia.  His echo in 2023 showed EF 45 to 50% with beat-to-beat variability and grade 1 diastolic dysfunction.  He had mild dilation of right atrium, there was mild MR, as well as moderate aortic sclerosis without stenosis.  There was moderate pulmonary regurgitation.  Presently he is asymptomatic on current therapy.  I  discussed with him my plans for upcoming retirement.  I will transition him to the care of Dr. Alvis Ba to be seen in 6 months.    Millicent Ally, MD, [facility]  11/18/2023 3:26 PM

## 2023-12-09 ENCOUNTER — Other Ambulatory Visit: Payer: Self-pay | Admitting: Cardiovascular Disease

## 2024-02-29 ENCOUNTER — Other Ambulatory Visit: Payer: Self-pay

## 2024-02-29 MED ORDER — METOPROLOL SUCCINATE ER 25 MG PO TB24: 12.5000 mg | ORAL_TABLET | Freq: Every day | ORAL | 2 refills | Status: DC

## 2024-05-05 ENCOUNTER — Other Ambulatory Visit: Payer: Self-pay

## 2024-05-08 MED ORDER — FUROSEMIDE 40 MG PO TABS: 40.0000 mg | ORAL_TABLET | Freq: Every day | ORAL | 1 refills | Status: AC

## 2024-05-09 ENCOUNTER — Other Ambulatory Visit: Payer: Self-pay | Admitting: Physician Assistant

## 2024-05-12 LAB — LAB REPORT - SCANNED: EGFR (Non-African Amer.): 40.8

## 2024-05-31 ENCOUNTER — Ambulatory Visit: Attending: Cardiovascular Disease | Admitting: Cardiovascular Disease

## 2024-05-31 ENCOUNTER — Encounter: Payer: Self-pay | Admitting: Cardiovascular Disease

## 2024-05-31 ENCOUNTER — Ambulatory Visit (INDEPENDENT_AMBULATORY_CARE_PROVIDER_SITE_OTHER)

## 2024-05-31 VITALS — BP 137/60 | HR 48 | Ht 65.0 in | Wt 192.2 lb

## 2024-05-31 DIAGNOSIS — R001 Bradycardia, unspecified: Secondary | ICD-10-CM | POA: Diagnosis not present

## 2024-05-31 DIAGNOSIS — E118 Type 2 diabetes mellitus with unspecified complications: Secondary | ICD-10-CM | POA: Diagnosis not present

## 2024-05-31 DIAGNOSIS — I441 Atrioventricular block, second degree: Secondary | ICD-10-CM | POA: Diagnosis not present

## 2024-05-31 DIAGNOSIS — E782 Mixed hyperlipidemia: Secondary | ICD-10-CM

## 2024-05-31 DIAGNOSIS — I2510 Atherosclerotic heart disease of native coronary artery without angina pectoris: Secondary | ICD-10-CM

## 2024-05-31 DIAGNOSIS — N1832 Chronic kidney disease, stage 3b: Secondary | ICD-10-CM

## 2024-05-31 DIAGNOSIS — I5189 Other ill-defined heart diseases: Secondary | ICD-10-CM | POA: Diagnosis not present

## 2024-05-31 DIAGNOSIS — I10 Essential (primary) hypertension: Secondary | ICD-10-CM

## 2024-05-31 NOTE — Progress Notes (Signed)
 Cardiology Office Note   Date:  05/31/2024  ID:  Peter M[place], DOB Jan 27, 1932, MRN [id number] PCP: Peter Charlie ORN, MD  [place] Cardiologist:  Peter Balding, MD Electrophysiologist:  Peter ONEIDA HOLTS, MD     History of Present Illness Peter Mullen is a 89 y.o. male previously followed by Peter [place], here to transition cardiology care.  He has no cardiovascular complaints and specifically denies shortness of breath or chest pain either at rest or with activity.  He is quite active for 89 year old, although his activities have had to be restricted recently due to problems with low back pain.  He lives independently.  He has not had any episodes of syncope and is not aware of palpitations.  He has rare episodes of dizziness, mostly with position changes, less common random very brief episodes of lightheadedness.  He does not have lower extremity edema or claudication and has not had symptoms of stroke or TIA.  He has a longstanding history of CAD (CABG 2001, LIMA-LAD, SVG-diagonal, SVG-PLA, SVG-PDA), HTN, DM 2, mixed hyperlipidemia, mild OSA not requiring CPAP (AHI 4.8) and obesity.  He has mildly reduced left ventricular systolic function most recently evaluated by echo 2023 (EF 45-50 %)  and had a low risk nuclear stress test in June 2023 that showed a mild fixed perfusion abnormality in the basal inferior and inferoseptal location suggesting an old scar, but no reversible ischemia.  Second-degree AV block Mobitz type I has been documented at least since 2016.  His smart watch has occasionally documented heart rates in the mid 30s at night, but during the day his heart rate is in the mid 40s-90s and has reached as high as 113 bpm.  Additional medical problems include infrequent episodes of gout on allopurinol , chronic kidney disease stage III, remote history of traumatic subdural hematoma after head injury in 2019, GERD.  He had problems with uncomfortable  gynecomastia on higher doses of spironolactone .  He has had previous cataract surgery and prostate surgery.  An arrhythmia monitor in September 2023 (after a fall complicated by prolonged immobilization and mild rhabdomyolysis) showed second-degree AV block Mobitz type I as well as frequent PVCs and ventricular couplets and a few brief runs of nonsustained VT.  He was referred to see EP, Dr. Sherrilyn Mullen.  His bradycardia was asymptomatic so he was continued on a very low-dose of metoprolol  succinate but pacemaker was not felt to be necessary.  He used to enjoy bowling on a regular basis, but has had to give this up due to problems with back pain.  He is less active.  He enjoys sweets and his favorite foods are hot dogs and hamburgers.   Metabolic control has been historically good with a hemoglobin A1c that has been consistently in the low to mid 6% range, but this year control has deteriorated and is approached recent hemoglobin A1c was 7.2%.  He has excellent control of his LDL cholesterol and the current medications, but his triglycerides are high in the 500 range despite taking statins and Vascepa .  He is planning a trip to [place] around the time of D-day 2026.  Studies Reviewed  Labs from PCP 05/12/2024 Cholesterol 171, triglycerides 511, HDL 42, LDL 27 Creatinine 1.6 (GFR 41), glucose 150, potassium 4.9, normal liver function tests, hemoglobin 15.1, uric acid 4.8  EKG Interpretation Date/Time:  Wednesday May 31 2024 08:41:18 EST Ventricular Rate:  48 PR Interval:    QRS Duration:  156 QT Interval:  544 QTC Calculation: 485 R Axis:   -63  Text Interpretation: Sinus bradycardia with 2nd degree A-V block (Mobitz I) with ventricular escape/fusion beats Left anterior fasicular block Right bundle branch block Bifascicular block ) When compared with ECG of 15-Nov-2023 11:46,  ventricular escape/fusion beats are now present Confirmed by Peter [place] [phone]) on 05/31/2024 9:15:50  AM    Personally reviewed his EKG from 11/15/2023 which shows sinus bradycardia with secondary degree AV block Mobitz type I and a ventricular rate of 47 bpm, right bundle branch block plus left anterior fascicular block.  QT 568 ms at 47 bpm.  QRS was 164 ms Risk Assessment/Calculations           Physical Exam VS:  BP 137/60   Pulse (!) 48   Ht 5' 5 (1.651 m)   Wt 192 lb 3.2 oz (87.2 kg)   SpO2 93%   BMI 31.98 kg/m        Wt Readings from Last 3 Encounters:  05/31/24 192 lb 3.2 oz (87.2 kg)  11/15/23 190 lb (86.2 kg)  12/14/22 191 lb (86.6 kg)    GEN: Well nourished, well developed in no acute distress NECK: No JVD; No carotid bruits CARDIAC: Regularly irregular rhythm with a pattern of Wenckebach cycles, faint aortic ejection murmur without radiation, no diastolic murmurs, rubs, gallops RESPIRATORY:  Clear to auscultation without rales, wheezing or rhonchi  ABDOMEN: Soft, non-tender, non-distended EXTREMITIES:  No edema; No deformity   ASSESSMENT AND PLAN CAD s/p CABG: His coronary problems were detected on the nuclear study performed to qualify him as a possible bone marrow transplant donor, not due to angina.  He had a low risk nuclear stress test just a couple of years ago. LV systolic dysfunction: He had some problems with lower extremity edema, but otherwise no overt heart failure to date.  Both his recent echo and nuclear scintigram suggest mildly depressed LVEF around 45-50%.  His medication include high-dose angiotensin receptor blocker, SGLT2 inhibitor and beta-blocker and maximum tolerated dose.  He used to take spironolactone  but this was causing problems with gynecomastia.  If he develops frank heart failure we could escalate therapy by replacing his ARB with Entresto, but this does not appear to be necessary at this time.  His blood pressure may not tolerate Entresto. 2nd deg AV block/bifascicular block: To date this has been asymptomatic and after seeing EP in 2023 it  was decided that a pacemaker was not necessary.  He has not had documentation of high-grade second-degree AV block or complete heart block.  He has not had syncope.  He does report some recent random episodes of dizziness.  Will have him wear another arrhythmia monitor to see if he has significant daytime bradycardia that could explain these.  If that is the case, he should receive a dual-chamber permanent pacemaker.  If we decide on pacemaker implantation, we should plan this to give him enough time to heal before his planned trip to [place] in May - June 2026. HLP: Most recent lipid profile shows excellent LDL cholesterol, but elevated triglycerides.  He reports compliance with both statin and Vascepa . DM: Good glycemic control on metformin  and Jardiance with hemoglobin A1c of 7.2%, but even tighter control would help improve his triglycerides.  Recommended reducing his intake of sweets and starchy foods (I would rather he have the burger than the bun, although ideally he will avoid the burgers and hot dogs as well).  Recheck lipid profile in a few months.  If  the triglycerides remain this high I would recommend adding low-dose fenofibrate 48 mg once daily. HTN: Well-controlled.  Avoid thiazide diuretics due to history of gout.  Most recent uric acid level was good at 4.8 CKD3: Creatinine is at baseline around 1.6, typically his GFR has been in the high 30s.    Patient Instructions  Medication Instructions:  No changes *If you need a refill on your cardiac medications before your next appointment, please call your pharmacy*  Lab Work: Lipid panel- fasting labs in April before appointment with Dr Francyne If you have labs (blood work) drawn today and your tests are completely normal, you will receive your results only by: MyChart Message (if you have MyChart) OR A paper copy in the mail If you have any lab test that is abnormal or we need to change your treatment, we will call you to review the  results.  Testing/Procedures: Your physician has recommended that you wear a 7 DAY ZIO-PATCH monitor. The Zio patch cardiac monitor continuously records heart rhythm data for up to 14 days, this is for patients being evaluated for multiple types heart rhythms. For the first 24 hours post application, please avoid getting the Zio monitor wet in the shower or by excessive sweating during exercise. After that, feel free to carry on with regular activities. Keep soaps and lotions away from the ZIO XT Patch.  This will be mailed to you, please expect 7-10 days to receive.    Applying the monitor   Shave hair from upper left chest.   Hold abrader disc by orange tab.  Rub abrader in 40 strokes over left upper chest as indicated in your monitor instructions.   Clean area with 4 enclosed alcohol pads .  Use all pads to assure are is cleaned thoroughly.  Let dry.   Apply patch as indicated in monitor instructions.  Patch will be place under collarbone on left side of chest with arrow pointing upward.   Rub patch adhesive wings for 2 minutes.Remove white label marked 1.  Remove white label marked 2.  Rub patch adhesive wings for 2 additional minutes.   While looking in a mirror, press and release button in center of patch.  A small green light will flash 3-4 times .  This will be your only indicator the monitor has been turned on.     Do not shower for the first 24 hours.  You may shower after the first 24 hours.   Press button if you feel a symptom. You will hear a small click.  Record Date, Time and Symptom in the Patient Log Book.   When you are ready to remove patch, follow instructions on last 2 pages of Patient Log Book.  Stick patch monitor onto last page of Patient Log Book.   Place Patient Log Book in [place] box.  Use locking tab on box and tape box closed securely.  The [employer] has [employer] on it.  Please place in mailbox as soon as possible.  Your physician should have  your test results approximately 7 days after the monitor has been mailed back to [facility].   Call [facility] Customer Care at [phone] if you have questions regarding your ZIO XT patch monitor.  Call them immediately if you see an orange light blinking on your monitor.   If your monitor falls off in less than 4 days contact our Monitor department at [phone].  If your monitor becomes loose or falls off after  4 days call [employer] at [phone] for suggestions on securing your monitor   Follow-Up: At [facility], you and your health needs are our priority.  As part of our continuing mission to provide you with exceptional heart care, our [place] are all part of one team.  This team includes your primary Cardiologist (physician) and Advanced Practice M[place] or APPs (Physician Assistants and Nurse Practitioners) who all work together to provide you with the care you need, when you need it.  Your next appointment:    April 2026  Provider:   Jerel Balding, MD    We recommend signing up for the patient portal called MyChart.  Sign up information is provided on this After Visit Summary.  MyChart is used to connect with patients for Virtual Visits (Telemedicine).  Patients are able to view lab/test results, encounter notes, upcoming appointments, etc.  Non-urgent messages can be sent to your provider as well.   To learn more about what you can do with MyChart, go to [email or web address].      Signed, Peter Balding, MD

## 2024-05-31 NOTE — Progress Notes (Unsigned)
 Enrolled for [employer] to mail a ZIO XT long term holter monitor to the patients address on file.

## 2024-05-31 NOTE — Patient Instructions (Signed)
 Medication Instructions:  No changes *If you need a refill on your cardiac medications before your next appointment, please call your pharmacy*  Lab Work: Lipid panel- fasting labs in April before appointment with Dr Francyne If you have labs (blood work) drawn today and your tests are completely normal, you will receive your results only by: MyChart Message (if you have MyChart) OR A paper copy in the mail If you have any lab test that is abnormal or we need to change your treatment, we will call you to review the results.  Testing/Procedures: Your physician has recommended that you wear a 7 DAY ZIO-PATCH monitor. The Zio patch cardiac monitor continuously records heart rhythm data for up to 14 days, this is for patients being evaluated for multiple types heart rhythms. For the first 24 hours post application, please avoid getting the Zio monitor wet in the shower or by excessive sweating during exercise. After that, feel free to carry on with regular activities. Keep soaps and lotions away from the ZIO XT Patch.  This will be mailed to you, please expect 7-10 days to receive.    Applying the monitor   Shave hair from upper left chest.   Hold abrader disc by orange tab.  Rub abrader in 40 strokes over left upper chest as indicated in your monitor instructions.   Clean area with 4 enclosed alcohol pads .  Use all pads to assure are is cleaned thoroughly.  Let dry.   Apply patch as indicated in monitor instructions.  Patch will be place under collarbone on left side of chest with arrow pointing upward.   Rub patch adhesive wings for 2 minutes.Remove white label marked 1.  Remove white label marked 2.  Rub patch adhesive wings for 2 additional minutes.   While looking in a mirror, press and release button in center of patch.  A small green light will flash 3-4 times .  This will be your only indicator the monitor has been turned on.     Do not shower for the first 24 hours.  You may  shower after the first 24 hours.   Press button if you feel a symptom. You will hear a small click.  Record Date, Time and Symptom in the Patient Log Book.   When you are ready to remove patch, follow instructions on last 2 pages of Patient Log Book.  Stick patch monitor onto last page of Patient Log Book.   Place Patient Log Book in [place] box.  Use locking tab on box and tape box closed securely.  The [employer] has [employer] on it.  Please place in mailbox as soon as possible.  Your physician should have your test results approximately 7 days after the monitor has been mailed back to [facility].   Call [facility] Customer Care at [phone] if you have questions regarding your ZIO XT patch monitor.  Call them immediately if you see an orange light blinking on your monitor.   If your monitor falls off in less than 4 days contact our Monitor department at [phone].  If your monitor becomes loose or falls off after 4 days call [employer] at [phone] for suggestions on securing your monitor   Follow-Up: At [facility], you and your health needs are our priority.  As part of our continuing mission to provide you with exceptional heart care, our providers are all part of one team.  This team includes your primary Cardiologist (physician) and Advanced  Practice Providers or APPs (Physician Assistants and Nurse Practitioners) who all work together to provide you with the care you need, when you need it.  Your next appointment:    April 2026  Provider:   Jerel Balding, MD    We recommend signing up for the patient portal called MyChart.  Sign up information is provided on this After Visit Summary.  MyChart is used to connect with patients for Virtual Visits (Telemedicine).  Patients are able to view lab/test results, encounter notes, upcoming appointments, etc.  Non-urgent messages can be sent to your provider as well.   To learn more about what you can do  with MyChart, go to [email or web address].

## 2024-06-21 ENCOUNTER — Telehealth: Payer: Self-pay | Admitting: Cardiovascular Disease

## 2024-06-21 ENCOUNTER — Ambulatory Visit: Payer: Self-pay | Admitting: Cardiovascular Disease

## 2024-06-21 DIAGNOSIS — I441 Atrioventricular block, second degree: Secondary | ICD-10-CM

## 2024-06-21 DIAGNOSIS — I493 Ventricular premature depolarization: Secondary | ICD-10-CM

## 2024-06-21 DIAGNOSIS — R001 Bradycardia, unspecified: Secondary | ICD-10-CM

## 2024-06-21 NOTE — Telephone Encounter (Signed)
 [place] with [employer] calling to report critical results from heart monitor

## 2024-06-21 NOTE — Telephone Encounter (Signed)
 Called and spoke to pt. He is currently in [place] . (Very difficult to hear him; he is out somewhere very noisy/busy). Pt states he feels the same as when he saw Dr. Francyne early this month. No CP, no sob/doe, only some rare dizzy episodes.   The CRITICAL ALERT: Jocelyn at [employer] called in about:  Complete heart Block on 06/05/24 at 10:43 am slowest 29 lasting 22.7 sec. This is on Strip 6  pg 12. Full report available on Epic. Also, there were 37 episodes of Vtach and 1 of SVT.  Dr. Francyne notified. Will place urgent EP referral and route to EP scheduling, per Dr. Francyne.

## 2024-06-25 NOTE — Progress Notes (Unsigned)
  Electrophysiology Office Follow up Visit Note:    Date:  06/26/2024   ID:  Peter [place], DOB [birth year], MRN [id number]  PCP:  Vernadine Charlie ORN, MD  [facility] Cardiologist:  Jerel Balding, MD  [facility] Electrophysiologist:  OLE ONEIDA HOLTS, MD    Interval History:     Peter Mullen is a 89 y.o. male who presents for a follow up visit.   I last saw the patient May 26, 2022 for bradycardia.  He had Mobitz 1 at that time and I did not think of a permanent pacemaker was indicated.  He saw Dr. Balding May 31, 2024.  At that appointment he reported episodes of dizziness.  He wore a monitor after that appointment.  He is with his granddaughter today in clinic.  He is doing well.  No syncope.  Does have rare lightheadedness episodes.      Past medical, surgical, social and family history were reviewed.  ROS:   Please see the history of present illness.    All other systems reviewed and are negative.  EKGs/Labs/Other Studies Reviewed:    The following studies were reviewed today:  June 21, 2024 ZIO monitor personally reviewed Heart rate 28-1 71, average 49 First-degree AV delay Many episodes of Wenckebach Frequent ventricular ectopy         Physical Exam:    VS:  BP 122/68   Pulse 96   Ht 5' 5 (1.651 m)   Wt 188 lb 3.2 oz (85.4 kg)   SpO2 97%   BMI 31.32 kg/m     Wt Readings from Last 3 Encounters:  06/26/24 188 lb 3.2 oz (85.4 kg)  05/31/24 192 lb 3.2 oz (87.2 kg)  11/15/23 190 lb (86.2 kg)     GEN: no distress.  Appears younger than stated age. CARD: RRR, No MRG RESP: No IWOB. CTAB.      ASSESSMENT:    1. Second degree AV block, Mobitz type I   2. Symptomatic bradycardia    PLAN:    In order of problems listed above:  #Symptomatic bradycardia #AV conduction disease Permanent pacemaker is indicated.  There is been progression of his underlying conduction system disease.  I discussed the pacemaker procedure in  detail including the risks and recovery and he wishes to proceed.  This will need to be done with one of my partners given my upcoming departure from [facility].  I discussed my upcoming departure from [facility] during today's clinic appointment.  The patient will continue to follow-up with one of my EP partners moving forward.   we will get an echocardiogram and then schedule follow-up appoint with Dr. Kennyth in clinic to discuss the pacemaker implant procedure.    Signed, Ole Holts, MD, [facility], [place], [facility] 06/26/2024 3:42 PM    Electrophysiology [facility]

## 2024-06-26 ENCOUNTER — Ambulatory Visit: Attending: Cardiology | Admitting: Cardiology

## 2024-06-26 ENCOUNTER — Encounter: Payer: Self-pay | Admitting: Cardiology

## 2024-06-26 VITALS — BP 122/68 | HR 96 | Ht 65.0 in | Wt 188.2 lb

## 2024-06-26 DIAGNOSIS — R001 Bradycardia, unspecified: Secondary | ICD-10-CM

## 2024-06-26 DIAGNOSIS — I441 Atrioventricular block, second degree: Secondary | ICD-10-CM

## 2024-06-26 NOTE — Patient Instructions (Signed)
 Medication Instructions:  Your physician recommends that you continue on your current medications as directed. Please refer to the Current Medication list given to you today.  *If you need a refill on your cardiac medications before your next appointment, please call your pharmacy*  Testing/Procedures: Echocardiogram  Your physician has requested that you have an echocardiogram. Echocardiography is a painless test that uses sound waves to create images of your heart. It provides your doctor with information about the size and shape of your heart and how well your heart's chambers and valves are working. This procedure takes approximately one hour. There are no restrictions for this procedure. Please do NOT wear cologne, perfume, aftershave, or lotions (deodorant is allowed). Please arrive 15 minutes prior to your appointment time.  Please note: We ask at that you not bring children with you during ultrasound (echo/ vascular) testing. Due to room size and safety concerns, children are not allowed in the ultrasound rooms during exams. Our front office staff cannot provide observation of children in our lobby area while testing is being conducted. An adult accompanying a patient to their appointment will only be allowed in the ultrasound room at the discretion of the ultrasound technician under special circumstances. We apologize for any inconvenience.  Pacemaker Implant Your physician has recommended that you have a pacemaker inserted. A pacemaker is a small device that is placed under the skin of your chest or abdomen to help control abnormal heart rhythms. This device uses electrical pulses to prompt the heart to beat at a normal rate. Pacemakers are used to treat heart rhythms that are too slow. Wire (leads) are attached to the pacemaker that goes into the chambers of you heart. This is done in the hospital and usually requires and overnight stay. Please see the instruction sheet given to you today  for more information.  Follow-Up: At [facility], you and your health needs are our priority.  As part of our continuing mission to provide you with exceptional heart care, our providers are all part of one team.  This team includes your primary Cardiologist (physician) and Advanced Practice Providers or APPs (Physician Assistants and Nurse Practitioners) who all work together to provide you with the care you need, when you need it.  Your next appointment:     Provider:   Fonda Kitty, MD

## 2024-06-28 ENCOUNTER — Ambulatory Visit (HOSPITAL_COMMUNITY): Admission: RE | Admit: 2024-06-28 | Discharge: 2024-06-28 | Attending: Cardiology | Admitting: Cardiology

## 2024-06-28 DIAGNOSIS — I441 Atrioventricular block, second degree: Secondary | ICD-10-CM | POA: Insufficient documentation

## 2024-06-28 DIAGNOSIS — R001 Bradycardia, unspecified: Secondary | ICD-10-CM | POA: Diagnosis not present

## 2024-06-28 LAB — ECHOCARDIOGRAM COMPLETE: S' Lateral: 3.32 cm

## 2024-06-29 ENCOUNTER — Ambulatory Visit: Attending: Cardiology | Admitting: Cardiology

## 2024-06-29 ENCOUNTER — Encounter: Payer: Self-pay | Admitting: Cardiology

## 2024-06-29 VITALS — BP 162/60 | HR 48 | Ht 65.0 in | Wt 186.0 lb

## 2024-06-29 DIAGNOSIS — I4510 Unspecified right bundle-branch block: Secondary | ICD-10-CM | POA: Diagnosis not present

## 2024-06-29 DIAGNOSIS — I444 Left anterior fascicular block: Secondary | ICD-10-CM

## 2024-06-29 DIAGNOSIS — I493 Ventricular premature depolarization: Secondary | ICD-10-CM | POA: Diagnosis not present

## 2024-06-29 DIAGNOSIS — I442 Atrioventricular block, complete: Secondary | ICD-10-CM

## 2024-06-29 DIAGNOSIS — R001 Bradycardia, unspecified: Secondary | ICD-10-CM

## 2024-06-29 NOTE — H&P (View-Only) (Signed)
 Electrophysiology Office Note:   Date:  06/29/2024  ID:  Peter Mullen, DOB Mar 06, 1932, MRN [id number]  Primary Cardiologist: Jerel Balding, MD Electrophysiologist: OLE ONEIDA HOLTS, MD      History of Present Illness:   Peter Mullen is a 89 y.o. male with h/o CAD (CABG 2001, LIMA-LAD, SVG-diagonal, SVG-PLA, SVG-PDA), HTN, DM 2, mixed hyperlipidemia, mild OSA not requiring CPAP (AHI 4.8) who is being seen today for evaluation for pacemaker implant.  Discussed the use of AI scribe software for clinical note transcription with the patient, who gave verbal consent to proceed.  History of Present Illness Peter Mullen is a 89 year old male with electrical conduction issues in the heart who presents for consideration of pacemaker implantation. He is accompanied by his granddaughter.  He experiences a sensation of being 'draggy' when walking long distances, which limits his activity level. Despite this, he considers himself 'pretty active for a 89 year old.'  He has been told that his heart's electrical system has shown signs of wear and tear over time, and prior EKGs have suggested he may eventually need a pacemaker. He is currently taking a baby aspirin , empagliflozin, and furosemide . He is not on any major blood thinners other than the baby aspirin .  He has upcoming travel plans, including a trip to [place] next week and a family trip to [place]  from December 20th through the 27th.  During the review of symptoms, his pulse drops to the lower thirties when asleep and is in the fifties when awake.    Review of systems complete and found to be negative unless listed in HPI.   EP Information / Studies Reviewed:    EKG is ordered today. Personal review as below.     Echo 06/28/24:  1. Left ventricular ejection fraction, by estimation, is 45 to 50%. The  left ventricle has mildly decreased function. The left ventricle  demonstrates global hypokinesis. Left ventricular  diastolic parameters are  indeterminate.   2. Right ventricular systolic function is mildly reduced. The right  ventricular size is mildly enlarged. There is mildly elevated pulmonary  artery systolic pressure. The estimated right ventricular systolic  pressure is 41.2 mmHg.   3. The mitral valve is grossly normal. Mild mitral valve regurgitation.  No evidence of mitral stenosis.   4. The aortic valve is tricuspid. There is mild calcification of the  aortic valve. Aortic valve regurgitation is trivial. Aortic valve  sclerosis/calcification is present, without any evidence of aortic  stenosis.   5. The inferior vena cava is normal in size with greater than 50%  respiratory variability, suggesting right atrial pressure of 3 mmHg.   Zio 05/2024    Physical Exam:   VS:  BP (!) 162/60 (BP Location: Left Arm, Patient Position: Sitting, Cuff Size: Large)   Pulse (!) 48   Ht 5' 5 (1.651 m)   Wt 186 lb (84.4 kg)   SpO2 98%   BMI 30.95 kg/m    Wt Readings from Last 3 Encounters:  06/29/24 186 lb (84.4 kg)  06/26/24 188 lb 3.2 oz (85.4 kg)  05/31/24 192 lb 3.2 oz (87.2 kg)     General: Well developed, in no acute distress.  Neck: No JVD.  Cardiac: Bradycardic, irregular Resp: Normal work of breathing.  Ext: No edema.  Neuro: No gross focal deficits.  Psych: Normal affect.   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 -Permanent pacemaker is indicated  in the setting of symptomatic bradycardia and intermittent complete heart block.  There has been progression of his underlying conduction system disease over time without any reversible causes. Explained risks, benefits, and alternatives to pacemaker implantation, including but not limited to bleeding, infection, damage to heart or lungs, heart attack, stroke, or death.  Pt verbalized understanding and agrees to proceed.  He has an LVEF of 45 to 50%.  Given lack  of clinical heart failure and advanced age, I think left bundle branch area pacing lead would be sufficient.  Plan for implant tomorrow given advanced conduction disease.  Follow up with Dr. Kennyth 90 days after implant.   Signed, Fonda Kennyth, MD

## 2024-06-29 NOTE — Patient Instructions (Signed)
 Medication Instructions:  Your physician recommends that you continue on your current medications as directed. Please refer to the Current Medication list given to you today.  *If you need a refill on your cardiac medications before your next appointment, please call your pharmacy*  Testing/Procedures: Pacemaker Implant Your physician has recommended that you have a pacemaker inserted. A pacemaker is a small device that is placed under the skin of your chest or abdomen to help control abnormal heart rhythms. This device uses electrical pulses to prompt the heart to beat at a normal rate. Pacemakers are used to treat heart rhythms that are too slow. Wire (leads) are attached to the pacemaker that goes into the chambers of you heart. This is done in the hospital and usually requires and overnight stay. Please see the instruction sheet given to you today for more information.  Follow-Up: At [facility], you and your health needs are our priority.  As part of our continuing mission to provide you with exceptional heart care, our providers are all part of one team.  This team includes your primary Cardiologist (physician) and Advanced Practice Providers or APPs (Physician Assistants and Nurse Practitioners) who all work together to provide you with the care you need, when you need it.  Your next appointment:   We will contact you to schedule your post-procedure appointments

## 2024-06-29 NOTE — Progress Notes (Signed)
 Electrophysiology Office Note:   Date:  06/29/2024  ID:  Peter Mullen, DOB Mar 06, 1932, MRN [id number]  Primary Cardiologist: Jerel Balding, MD Electrophysiologist: OLE ONEIDA HOLTS, MD      History of Present Illness:   Peter Mullen is a 89 y.o. male with h/o CAD (CABG 2001, LIMA-LAD, SVG-diagonal, SVG-PLA, SVG-PDA), HTN, DM 2, mixed hyperlipidemia, mild OSA not requiring CPAP (AHI 4.8) who is being seen today for evaluation for pacemaker implant.  Discussed the use of AI scribe software for clinical note transcription with the patient, who gave verbal consent to proceed.  History of Present Illness Peter Mullen is a 89 year old male with electrical conduction issues in the heart who presents for consideration of pacemaker implantation. He is accompanied by his granddaughter.  He experiences a sensation of being 'draggy' when walking long distances, which limits his activity level. Despite this, he considers himself 'pretty active for a 89 year old.'  He has been told that his heart's electrical system has shown signs of wear and tear over time, and prior EKGs have suggested he may eventually need a pacemaker. He is currently taking a baby aspirin , empagliflozin, and furosemide . He is not on any major blood thinners other than the baby aspirin .  He has upcoming travel plans, including a trip to [place] next week and a family trip to [place]  from December 20th through the 27th.  During the review of symptoms, his pulse drops to the lower thirties when asleep and is in the fifties when awake.    Review of systems complete and found to be negative unless listed in HPI.   EP Information / Studies Reviewed:    EKG is ordered today. Personal review as below.     Echo 06/28/24:  1. Left ventricular ejection fraction, by estimation, is 45 to 50%. The  left ventricle has mildly decreased function. The left ventricle  demonstrates global hypokinesis. Left ventricular  diastolic parameters are  indeterminate.   2. Right ventricular systolic function is mildly reduced. The right  ventricular size is mildly enlarged. There is mildly elevated pulmonary  artery systolic pressure. The estimated right ventricular systolic  pressure is 41.2 mmHg.   3. The mitral valve is grossly normal. Mild mitral valve regurgitation.  No evidence of mitral stenosis.   4. The aortic valve is tricuspid. There is mild calcification of the  aortic valve. Aortic valve regurgitation is trivial. Aortic valve  sclerosis/calcification is present, without any evidence of aortic  stenosis.   5. The inferior vena cava is normal in size with greater than 50%  respiratory variability, suggesting right atrial pressure of 3 mmHg.   Zio 05/2024    Physical Exam:   VS:  BP (!) 162/60 (BP Location: Left Arm, Patient Position: Sitting, Cuff Size: Large)   Pulse (!) 48   Ht 5' 5 (1.651 m)   Wt 186 lb (84.4 kg)   SpO2 98%   BMI 30.95 kg/m    Wt Readings from Last 3 Encounters:  06/29/24 186 lb (84.4 kg)  06/26/24 188 lb 3.2 oz (85.4 kg)  05/31/24 192 lb 3.2 oz (87.2 kg)     General: Well developed, in no acute distress.  Neck: No JVD.  Cardiac: Bradycardic, irregular Resp: Normal work of breathing.  Ext: No edema.  Neuro: No gross focal deficits.  Psych: Normal affect.   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 -Permanent pacemaker is indicated  in the setting of symptomatic bradycardia and intermittent complete heart block.  There has been progression of his underlying conduction system disease over time without any reversible causes. Explained risks, benefits, and alternatives to pacemaker implantation, including but not limited to bleeding, infection, damage to heart or lungs, heart attack, stroke, or death.  Pt verbalized understanding and agrees to proceed.  He has an LVEF of 45 to 50%.  Given lack  of clinical heart failure and advanced age, I think left bundle branch area pacing lead would be sufficient.  Plan for implant tomorrow given advanced conduction disease.  Follow up with Dr. Kennyth 90 days after implant.   Signed, Fonda Kennyth, MD

## 2024-06-30 ENCOUNTER — Other Ambulatory Visit: Payer: Self-pay

## 2024-06-30 ENCOUNTER — Ambulatory Visit (HOSPITAL_COMMUNITY): Admission: RE | Disposition: A | Payer: Self-pay | Attending: Cardiology

## 2024-06-30 ENCOUNTER — Ambulatory Visit (HOSPITAL_COMMUNITY)
Admission: RE | Admit: 2024-06-30 | Discharge: 2024-07-01 | Disposition: A | Discharge status: Home / Self Care | Source: Ambulatory Visit | Attending: Cardiology | Admitting: Cardiology

## 2024-06-30 DIAGNOSIS — I441 Atrioventricular block, second degree: Secondary | ICD-10-CM

## 2024-06-30 DIAGNOSIS — I442 Atrioventricular block, complete: Secondary | ICD-10-CM | POA: Diagnosis not present

## 2024-06-30 HISTORY — PX: PACEMAKER IMPLANT: EP1218

## 2024-06-30 LAB — BASIC METABOLIC PANEL WITH GFR
Anion gap: 11 (ref 5–15)
BUN: 29 mg/dL — ABNORMAL HIGH (ref 8–23)
CO2: 25 mmol/L (ref 22–32)
Calcium: 8.9 mg/dL (ref 8.9–10.3)
Chloride: 104 mmol/L (ref 98–111)
Creatinine, Ser: 1.78 mg/dL — ABNORMAL HIGH (ref 0.61–1.24)
GFR, Estimated: 36 mL/min — ABNORMAL LOW (ref >60)
Glucose, Bld: 149 mg/dL — ABNORMAL HIGH (ref 70–99)
Potassium: 3.5 mmol/L (ref 3.5–5.1)
Sodium: 140 mmol/L (ref 135–145)

## 2024-06-30 LAB — CBC
HCT: 45.6 % (ref 39.0–52.0)
Hemoglobin: 14.8 g/dL (ref 13.0–17.0)
MCH: 32.2 pg (ref 26.0–34.0)
MCHC: 32.5 g/dL (ref 30.0–36.0)
MCV: 99.3 fL (ref 80.0–100.0)
Platelets: 180 K/uL (ref 150–400)
RBC: 4.59 MIL/uL (ref 4.22–5.81)
RDW: 14.7 % (ref 11.5–15.5)
WBC: 7.6 K/uL (ref 4.0–10.5)
nRBC: 0 % (ref 0.0–0.2)

## 2024-06-30 LAB — GLUCOSE, CAPILLARY
Glucose-Capillary: 166 mg/dL — ABNORMAL HIGH (ref 70–99)
Glucose-Capillary: 110 mg/dL — ABNORMAL HIGH (ref 70–99)

## 2024-06-30 SURGERY — PACEMAKER IMPLANT

## 2024-06-30 MED ORDER — CHLORHEXIDINE GLUCONATE 4 % EX SOLN
4.0000 | Freq: Once | CUTANEOUS | Status: DC
  Filled 2024-06-30: qty 60

## 2024-06-30 MED ORDER — FENTANYL CITRATE (PF) 100 MCG/2ML IJ SOLN
INTRAMUSCULAR | Status: AC
  Filled 2024-06-30: qty 2

## 2024-06-30 MED ORDER — ELDERBERRY 500 MG PO CAPS: 1.0000 | ORAL_CAPSULE | Freq: Every day | ORAL | Status: DC

## 2024-06-30 MED ORDER — LIDOCAINE HCL (PF) 1 % IJ SOLN
INTRAMUSCULAR | Status: AC
  Filled 2024-06-30: qty 60

## 2024-06-30 MED ORDER — PANTOPRAZOLE SODIUM 40 MG PO TBEC
40.0000 mg | DELAYED_RELEASE_TABLET | Freq: Every day | ORAL | Status: DC
  Administered 2024-06-30 – 2024-07-01 (×2): 40 mg via ORAL
  Filled 2024-06-30 (×2): qty 1

## 2024-06-30 MED ORDER — MIDAZOLAM HCL 2 MG/2ML IJ SOLN
INTRAMUSCULAR | Status: AC
  Filled 2024-06-30: qty 2

## 2024-06-30 MED ORDER — METFORMIN HCL 500 MG PO TABS
500.0000 mg | ORAL_TABLET | Freq: Two times a day (BID) | ORAL | Status: DC
  Administered 2024-07-01: 500 mg via ORAL
  Filled 2024-06-30: qty 1

## 2024-06-30 MED ORDER — SODIUM CHLORIDE 0.9 % IV SOLN: 80.0000 mg | INTRAVENOUS | Status: AC

## 2024-06-30 MED ORDER — LIDOCAINE HCL (PF) 1 % IJ SOLN
INTRAMUSCULAR | Status: DC | PRN
  Administered 2024-06-30: 60 mL

## 2024-06-30 MED ORDER — VITAMIN E 45 MG (100 UNIT) PO CAPS
400.0000 [IU] | ORAL_CAPSULE | Freq: Every day | ORAL | Status: DC
  Administered 2024-07-01: 400 [IU] via ORAL
  Filled 2024-06-30: qty 4

## 2024-06-30 MED ORDER — POVIDONE-IODINE 10 % EX SWAB
2.0000 | Freq: Once | CUTANEOUS | Status: AC
  Administered 2024-06-30: 2 via TOPICAL

## 2024-06-30 MED ORDER — MIDAZOLAM HCL 5 MG/5ML IJ SOLN
INTRAMUSCULAR | Status: DC | PRN
  Administered 2024-06-30: .5 mg via INTRAVENOUS
  Administered 2024-06-30: 1 mg via INTRAVENOUS

## 2024-06-30 MED ORDER — MELATONIN 5 MG PO TABS
10.0000 mg | ORAL_TABLET | Freq: Every day | ORAL | Status: DC
  Administered 2024-06-30: 10 mg via ORAL
  Filled 2024-06-30: qty 2

## 2024-06-30 MED ORDER — CEFAZOLIN SODIUM-DEXTROSE 2-4 GM/100ML-% IV SOLN
INTRAVENOUS | Status: AC
  Administered 2024-06-30: 2 g via INTRAVENOUS
  Filled 2024-06-30: qty 100

## 2024-06-30 MED ORDER — IRBESARTAN 150 MG PO TABS
150.0000 mg | ORAL_TABLET | Freq: Every day | ORAL | Status: DC
  Administered 2024-07-01: 150 mg via ORAL
  Filled 2024-06-30: qty 1

## 2024-06-30 MED ORDER — FOLIC ACID 1 MG PO TABS
500.0000 ug | ORAL_TABLET | Freq: Every day | ORAL | Status: DC
  Administered 2024-07-01: 0.5 mg via ORAL
  Filled 2024-06-30: qty 1

## 2024-06-30 MED ORDER — VITAMIN C 500 MG PO TABS
1000.0000 mg | ORAL_TABLET | Freq: Every day | ORAL | Status: DC
  Administered 2024-07-01: 1000 mg via ORAL
  Filled 2024-06-30: qty 2

## 2024-06-30 MED ORDER — METOPROLOL SUCCINATE ER 25 MG PO TB24: 25.0000 mg | ORAL_TABLET | Freq: Every day | ORAL | Status: DC

## 2024-06-30 MED ORDER — MONTELUKAST SODIUM 10 MG PO TABS
10.0000 mg | ORAL_TABLET | Freq: Every day | ORAL | Status: DC
  Administered 2024-06-30: 10 mg via ORAL
  Filled 2024-06-30: qty 1

## 2024-06-30 MED ORDER — ATORVASTATIN CALCIUM 10 MG PO TABS
20.0000 mg | ORAL_TABLET | Freq: Every day | ORAL | Status: DC
  Administered 2024-06-30 – 2024-07-01 (×2): 20 mg via ORAL
  Filled 2024-06-30 (×2): qty 2

## 2024-06-30 MED ORDER — HEPARIN (PORCINE) IN NACL 1000-0.9 UT/500ML-% IV SOLN
INTRAVENOUS | Status: DC | PRN
  Administered 2024-06-30: 500 mL

## 2024-06-30 MED ORDER — VITAMIN B-12 100 MCG PO TABS
500.0000 ug | ORAL_TABLET | Freq: Every day | ORAL | Status: DC
  Administered 2024-07-01: 500 ug via ORAL
  Filled 2024-06-30: qty 5

## 2024-06-30 MED ORDER — VITAMIN D3 25 MCG (1000 UNIT) PO TABS
5000.0000 [IU] | ORAL_TABLET | Freq: Every day | ORAL | Status: DC
  Administered 2024-07-01: 5000 [IU] via ORAL
  Filled 2024-06-30 (×2): qty 5

## 2024-06-30 MED ORDER — FLUTICASONE PROPIONATE 50 MCG/ACT NA SUSP
1.0000 | Freq: Every evening | NASAL | Status: DC
  Filled 2024-06-30: qty 16

## 2024-06-30 MED ORDER — ACETAMINOPHEN 325 MG PO TABS
325.0000 mg | ORAL_TABLET | ORAL | Status: DC | PRN
  Administered 2024-06-30 – 2024-07-01 (×2): 650 mg via ORAL
  Filled 2024-06-30 (×2): qty 2

## 2024-06-30 MED ORDER — CEFAZOLIN SODIUM-DEXTROSE 2-4 GM/100ML-% IV SOLN: 2.0000 g | INTRAVENOUS | Status: AC

## 2024-06-30 MED ORDER — ZINC 50 MG PO TABS: 50.0000 mg | ORAL_TABLET | Freq: Every day | ORAL | Status: DC

## 2024-06-30 MED ORDER — SODIUM CHLORIDE 0.9 % IV SOLN
INTRAVENOUS | Status: AC
  Administered 2024-06-30: 80 mg
  Filled 2024-06-30: qty 2

## 2024-06-30 MED ORDER — MAGNESIUM SULFATE 2 GM/50ML IV SOLN
2.0000 g | Freq: Once | INTRAVENOUS | Status: AC
  Administered 2024-06-30: 2 g via INTRAVENOUS
  Filled 2024-06-30: qty 50

## 2024-06-30 MED ORDER — FENTANYL CITRATE (PF) 100 MCG/2ML IJ SOLN
INTRAMUSCULAR | Status: DC | PRN
  Administered 2024-06-30 (×2): 12.5 ug via INTRAVENOUS

## 2024-06-30 MED ORDER — ALLOPURINOL 300 MG PO TABS
300.0000 mg | ORAL_TABLET | Freq: Every day | ORAL | Status: DC
  Administered 2024-07-01: 300 mg via ORAL
  Filled 2024-06-30: qty 1

## 2024-06-30 MED ORDER — SODIUM CHLORIDE 0.9 % IV SOLN: INTRAVENOUS | Status: DC

## 2024-06-30 SURGICAL SUPPLY — 11 items
CABLE SURGICAL S-101-97-12 (CABLE) ×1 IMPLANT
CATH CPS LOCATOR 3D MED (CATHETERS) IMPLANT
KIT MICROPUNCTURE NIT STIFF (SHEATH) IMPLANT
LEAD ULTIPACE 52 LPA1231/52 (Lead) IMPLANT
LEAD ULTIPACE 65 LPA1231/65 (Lead) IMPLANT
PACEMAKER ASSURITY DR-RF (Pacemaker) IMPLANT
PAD DEFIB RADIO PHYSIO CONN (PAD) ×1 IMPLANT
SHEATH 7FR PRELUDE SNAP 13 (SHEATH) IMPLANT
SHEATH 9FR PRELUDE SNAP 13 (SHEATH) IMPLANT
TRAY PACEMAKER INSERTION (PACKS) ×1 IMPLANT
WIRE HI TORQ VERSACORE-J 145CM (WIRE) IMPLANT

## 2024-06-30 NOTE — Discharge Instructions (Addendum)
 After Your Pacemaker   You have a Abbott Pacemaker  If you have a Medtronic or Biotronik device, plug in your home monitor once you get home, and no manual interaction is required.   If you have an Abbott or [employer]ozone device, plug your home monitor once you get home, sit near the device, and press the large activation button. Sit nearby until the process is complete, usually notated by lights on the monitor.   If you were set up for monitoring using an app on your phone, make sure the app remains open in the background and the Bluetooth remains on.  ACTIVITY Do not lift your arm above shoulder height for 1 week after your procedure. After 7 days, you may progress as below.  You should remove your sling 24 hours after your procedure, unless otherwise instructed by your provider.     Friday July 07, 2024  Saturday July 08, 2024 Sunday July 09, 2024 Monday July 10, 2024   Do not lift, push, pull, or carry anything over 10 pounds with the affected arm until 6 weeks (Friday August 11, 2024 ) after your procedure.   You may drive AFTER your wound check, unless you have been told otherwise by your provider.   Ask your healthcare provider when you can go back to work   INCISION/Dressing If you are on a blood thinner such as Coumadin, Xarelto, Eliquis, Plavix, or Pradaxa please confirm with your provider when this should be resumed.   If large square, outer bandage is left in place, this can be removed after 24 hours from your procedure. Do not remove steri-strips or glue as below.   If a PRESSURE DRESSING (a bulky dressing that usually goes up over your shoulder) was applied or left in place, please follow instructions given by your provider on when to return to have this removed.   Monitor your Pacemaker site for redness, swelling, and drainage. Call the device clinic at [phone] if you experience these symptoms or fever/chills.  If your incision is sealed with  Steri-strips or staples, you may shower 7 days after your procedure or when told by your provider. Do not remove the steri-strips or let the shower hit directly on your site. You may wash around your site with soap and water.    If you were discharged in a sling, please do not wear this during the day more than 48 hours after your surgery unless otherwise instructed. This may increase the risk of stiffness and soreness in your shoulder.   Avoid lotions, ointments, or perfumes over your incision until it is well-healed.  You may use a hot tub or a pool AFTER your wound check appointment if the incision is completely closed.  Pacemaker Alerts:  Some alerts are vibratory and others beep. These are NOT emergencies. Please call our office to let us  know. If this occurs at night or on weekends, it can wait until the next business day. Send a remote transmission.  If your device is capable of reading fluid status (for heart failure), you will be offered monthly monitoring to review this with you.   DEVICE MANAGEMENT Remote monitoring is used to monitor your pacemaker from home. This monitoring is scheduled every 91 days by our office. It allows us  to keep an eye on the functioning of your device to ensure it is working properly. You will routinely see your Electrophysiologist annually (more often if necessary).  This will appear as a REMOTE check on your  MyChart schedule. These are automatic and there is nothing for you to manually do unless otherwise instructed.  You should receive your ID card for your new device in 4-8 weeks. Keep this card with you at all times once received. Consider wearing a medical alert bracelet or necklace.  Your Pacemaker may be MRI compatible. This will be discussed at your next office visit/wound check.  You should avoid contact with strong electric or magnetic fields.   Do not use amateur (ham) radio equipment or electric (arc) welding torches. MP3 player headphones with  magnets should not be used. Some devices are safe to use if held at least 12 inches (30 cm) from your Pacemaker. These include power tools, lawn mowers, and speakers. If you are unsure if something is safe to use, ask your health care provider.  When using your cell phone, hold it to the ear that is on the opposite side from the Pacemaker. Do not leave your cell phone in a pocket over the Pacemaker.  You may safely use electric blankets, heating pads, computers, and microwave ovens.  Call the office right away if: You have chest pain. You feel more short of breath than you have felt before. You feel more light-headed than you have felt before. Your incision starts to open up.  This information is not intended to replace advice given to you by your health care provider. Make sure you discuss any questions you have with your health care provider.

## 2024-06-30 NOTE — Progress Notes (Signed)
 EP Attending  Patient seen and examined. See Dr. Shaune note. I have been asked to assist Dr. Kennyth in getting [name] procedure started. I have again reviewed the indications/risks/benefits/goals/expectations of DDD PM insertion secondary to his heart block and he wishes to proceed.  Danelle Allexa Acoff,MD

## 2024-06-30 NOTE — Plan of Care (Signed)
  Problem: Education: Goal: Knowledge of cardiac device and self-care will improve Outcome: Progressing Goal: Ability to safely manage health related needs after discharge will improve Outcome: Progressing   Problem: Clinical Measurements: Goal: Cardiovascular complication will be avoided Outcome: Progressing   Problem: Nutrition: Goal: Adequate nutrition will be maintained Outcome: Completed/Met   Problem: Coping: Goal: Level of anxiety will decrease Outcome: Progressing

## 2024-06-30 NOTE — Interval H&P Note (Signed)
 History and Physical Interval Note:  06/30/2024 3:48 PM  Peter Mullen  has presented today for surgery, with the diagnosis of symptomatic bradycardia and complete heart block.  The various methods of treatment have been discussed with the patient and family. After consideration of risks, benefits and other options for treatment, the patient has consented to  Procedure(s): PACEMAKER IMPLANT (N/A) as a surgical intervention.  The patient's history has been reviewed, patient examined, no change in status, stable for surgery.  I have reviewed the patient's chart and labs.  Questions were answered to the patient's satisfaction.     Fonda Kitty

## 2024-07-01 ENCOUNTER — Ambulatory Visit (HOSPITAL_COMMUNITY)

## 2024-07-01 DIAGNOSIS — I442 Atrioventricular block, complete: Secondary | ICD-10-CM

## 2024-07-01 LAB — BASIC METABOLIC PANEL WITH GFR
Anion gap: 12 (ref 5–15)
BUN: 26 mg/dL — ABNORMAL HIGH (ref 8–23)
CO2: 23 mmol/L (ref 22–32)
Calcium: 8.7 mg/dL — ABNORMAL LOW (ref 8.9–10.3)
Chloride: 102 mmol/L (ref 98–111)
Creatinine, Ser: 1.55 mg/dL — ABNORMAL HIGH (ref 0.61–1.24)
GFR, Estimated: 42 mL/min — ABNORMAL LOW (ref >60)
Glucose, Bld: 144 mg/dL — ABNORMAL HIGH (ref 70–99)
Potassium: 3.4 mmol/L — ABNORMAL LOW (ref 3.5–5.1)
Sodium: 137 mmol/L (ref 135–145)

## 2024-07-01 LAB — MAGNESIUM: Magnesium: 2.1 mg/dL (ref 1.7–2.4)

## 2024-07-01 MED ORDER — METOPROLOL SUCCINATE ER 25 MG PO TB24: 25.0000 mg | ORAL_TABLET | Freq: Every day | ORAL | 1 refills | Status: DC

## 2024-07-01 MED ORDER — POTASSIUM CHLORIDE CRYS ER 20 MEQ PO TBCR
20.0000 meq | EXTENDED_RELEASE_TABLET | Freq: Once | ORAL | Status: AC
  Administered 2024-07-01: 20 meq via ORAL
  Filled 2024-07-01: qty 1

## 2024-07-01 NOTE — Progress Notes (Signed)
 DISCHARGE NOTE HOME Peter Mullen to be discharged Home per MD order. Discussed prescriptions and follow up appointments with the patient. Prescriptions given to patient; medication list explained in detail. Patient verbalized understanding.  Skin clean, dry and intact without evidence of skin break down, no evidence of skin tears noted. IV catheter discontinued intact. Site without signs and symptoms of complications. Dressing and pressure applied. Pt denies pain at the site currently. No complaints noted.  See Lda for surgical wound at discharge Patient free of lines, drains, and wounds.   An After Visit Summary (AVS) was printed and given to the patient. Patient escorted via wheelchair, and discharged home via private auto.  [name], RN

## 2024-07-01 NOTE — Progress Notes (Signed)
 Progress Note  Patient Name: Peter Mullen Date of Encounter: 07/01/2024  Primary Cardiologist: Jerel Balding, MD   Subjective   No chest pain or sob. Minimal incisional soreness.   Inpatient Medications    Scheduled Meds:  allopurinol   300 mg Oral Daily   ascorbic acid   1,000 mg Oral Daily   atorvastatin   20 mg Oral Daily   cholecalciferol   5,000 Units Oral Daily   fluticasone   1 spray Each Nare QPM   folic acid   500 mcg Oral Daily   irbesartan   150 mg Oral Daily   melatonin  10 mg Oral QHS   metFORMIN   500 mg Oral BID WC   metoprolol  succinate  25 mg Oral Daily   montelukast   10 mg Oral QHS   pantoprazole   40 mg Oral Daily   cyanocobalamin   500 mcg Oral Daily   vitamin E   400 Units Oral Daily   Continuous Infusions:  PRN Meds: acetaminophen    Vital Signs    Vitals:   06/30/24 1933 06/30/24 2346 07/01/24 0455 07/01/24 0808  BP: (!) 173/91 (!) 143/75 (!) 160/80 (!) 150/77  Pulse: 72 (!) 53 72 (!) 103  Resp: 18 (!) 22 13 (!) 22  Temp: 97.8 F (36.6 C) 97.7 F (36.5 C) 98.2 F (36.8 C) 98.3 F (36.8 C)  TempSrc: Oral Oral Oral Oral  SpO2: 96% 98% 97% 98%  Weight:      Height:        Intake/Output Summary (Last 24 hours) at 07/01/2024 1045 Last data filed at 07/01/2024 0856 Gross per 24 hour  Intake 658.33 ml  Output 775 ml  Net -116.67 ml   Filed Weights   06/30/24 1359  Weight: 82.6 kg    Telemetry    Nsr with ventricular pacing and PVC's - Personally Reviewed  ECG    nsr - Personally Reviewed  Physical Exam   GEN: No acute distress.   Neck: No JVD Cardiac: IRRR, no murmurs, rubs, or gallops.  Respiratory: Clear to auscultation bilaterally. GI: Soft, nontender, non-distended  MS: No edema; No deformity. Neuro:  Nonfocal  Psych: Normal affect   Labs    Chemistry Recent Labs  Lab 06/30/24 1341 07/01/24 0001  NA 140 137  K 3.5 3.4*  CL 104 102  CO2 25 23  GLUCOSE 149* 144*  BUN 29* 26*  CREATININE 1.78* 1.55*  CALCIUM   8.9 8.7*  GFRNONAA 36* 42*  ANIONGAP 11 12     Hematology Recent Labs  Lab 06/30/24 1341  WBC 7.6  RBC 4.59  HGB 14.8  HCT 45.6  MCV 99.3  MCH 32.2  MCHC 32.5  RDW 14.7  PLT 180    Cardiac EnzymesNo results for input(s): TROPONINI in the last 168 hours. No results for input(s): TROPIPOC in the last 168 hours.   BNPNo results for input(s): BNP, PROBNP in the last 168 hours.   DDimer No results for input(s): DDIMER in the last 168 hours.   Radiology    DG Chest 2 View Result Date: 07/01/2024 EXAM: 2 VIEW(S) XRAY OF THE CHEST 07/01/2024 05:41:00 AM COMPARISON: 05/08/2023 CLINICAL HISTORY: Cardiac device in situ, other [id number] FINDINGS: LUNGS AND PLEURA: No focal pulmonary opacity. No pleural effusion. No pneumothorax identified following pacer placement. HEART AND MEDIASTINUM: Median sternotomy wires and CABG markers noted. Aortic atherosclerosis. Left chest wall pacer device noted with leads in the right atrial appendage and right ventricle. BONES AND SOFT TISSUES: Thoracic spondylosis. Degenerative changes are noted within both  acromioclavicular joints. No acute osseous abnormality. IMPRESSION: 1. No pneumothorax following pacer placement. Electronically signed by: Waddell Calk MD 07/01/2024 05:55 AM EST RP Workstation: [id number]   EP PPM/ICD IMPLANT Result Date: 06/30/2024  CONCLUSIONS:  1. Successful dual chamber pacemaker implant with LBBAP lead.  2.  No early apparent complications. Fonda Kitty, MD, [facility], [facility] Cardiac Electrophysiology    Cardiac Studies   none  Patient Profile     89 y.o. male admitted with high grade heart block, s/p PPM insertion  Assessment & Plan    High grade heart block - he is doing well after DDD PM insertion. Ok for discharge home with usual followup.  PPM -interrogation of his new DDD PM demonstrates normal DDD PM function and CXR looks good.   For questions or updates, please contact [facility] Please consult  [email or web address] for contact info under Cardiology/STEMI.      Signed, Danelle Waddell, MD  07/01/2024, 10:45 AM

## 2024-07-01 NOTE — Discharge Summary (Cosign Needed Addendum)
 ELECTROPHYSIOLOGY PROCEDURE DISCHARGE SUMMARY    Patient ID: Peter Mullen,  MRN: [id number], DOB/AGE: 88-01-1932 88 y.o.  Admit date: 06/30/2024 Discharge date: 07/01/2024  Primary Care Physician: Tisovec, Richard W, MD  Primary Cardiologist: Jerel Balding, MD  Electrophysiologist: Dr. Kennyth   Primary Discharge Diagnosis:  Symptomatic bradycardia status post pacemaker implantation this admission  Secondary Discharge Diagnosis:  CAD HTN HLD  Allergies  Allergen Reactions   Codeine Other (See Comments)    Unknown - years ago    Cyclobenzaprine Nausea And Vomiting    Patient unsure   Nsaids Other (See Comments)    Upset stomach      Procedures This Admission:  1.  Implantation of a Abbott Dual Chamber PPM. Lead Placement: RV lead (model UltiPace [id number], serial [id number]) with RA lead (model UltiPace [id number], serial [id number]). Generator (model Assurity MRI [id number], serial [id number]). There were no immediate post procedure complications.   2.  CXR on 07/01/2024 demonstrated no pneumothorax status post device implantation.    Brief HPI: Peter Mullen is a 89 y.o. male was referred to electrophysiology in the outpatient setting for  consideration of PPM implantation. 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 The patient has had symptomatic bradycardia without reversible causes identified. Risks, benefits, and alternatives to PPM implantation were reviewed with the patient who wished to proceed.   Hospital Course:  The patient was admitted and underwent implantation of a Abbott dual chamber PPM with details as outlined above. He was monitored on telemetry overnight which demonstrated appropriate pacing. Left chest was without hematoma or ecchymosis. The device was interrogated and found  to be functioning normally. CXR was obtained and demonstrated no pneumothorax status post device implantation.  Wound care, arm mobility, and restrictions were reviewed with the patient. The patient was examined and considered stable for discharge to home.    Anticoagulation resumption This patient is not on anticoagulation.   Discharge Vitals: Vitals:   06/30/24 1933 06/30/24 2346 07/01/24 0455 07/01/24 0808  BP: (!) 173/91 (!) 143/75 (!) 160/80 (!) 150/77  Pulse: 72 (!) 53 72 (!) 103  Resp: 18 (!) 22 13 (!) 22  Temp: 97.8 F (36.6 C) 97.7 F (36.5 C) 98.2 F (36.8 C) 98.3 F (36.8 C)  TempSrc: Oral Oral Oral Oral  SpO2: 96% 98% 97% 98%  Weight:      Height:         Discharge Medications:  Allergies as of 07/01/2024       Reactions   Codeine Other (See Comments)   Unknown - years ago   Cyclobenzaprine Nausea And Vomiting   Patient unsure   Nsaids Other (See Comments)   Upset stomach        Medication List     TAKE these medications    allopurinol  300 MG tablet Commonly known as: ZYLOPRIM  Take 300 mg by mouth daily.   ascorbic acid  1000 MG tablet Commonly known as: VITAMIN C  Take 1,000 mg by mouth daily. What changed: how much to take   aspirin  81 MG tablet Take 81 mg by mouth every evening.   atorvastatin  40 MG tablet Commonly known as: LIPITOR TAKE 1/2 (ONE-HALF) TABLET BY MOUTH ONCE DAILY AT  6  PM   Biotin 1000 MCG Chew Chew 1 tablet by mouth every evening.  cetirizine 10 MG tablet Commonly known as: ZYRTEC Take 10 mg by mouth at bedtime.   colchicine 0.6 MG tablet Take 0.6 mg by mouth as needed.   cyanocobalamin  100 MCG tablet Take 500 mcg by mouth daily.   Elderberry 500 MG Caps Take 1 capsule by mouth daily at 6 (six) AM.   empagliflozin 25 MG Tabs tablet Commonly known as: JARDIANCE Take 12.5 mg by mouth daily.   fluticasone  50 MCG/ACT nasal spray Commonly known as: FLONASE  Place 1 spray into both nostrils every evening.    folic acid  800 MCG tablet Commonly known as: FOLVITE  Take 400 mcg by mouth daily. What changed:  how much to take when to take this   furosemide  40 MG tablet Commonly known as: LASIX  Take 1 tablet (40 mg total) by mouth daily.   icosapent  Ethyl 1 g capsule Commonly known as: Vascepa  Take 2 capsules (2 g total) by mouth 2 (two) times daily.   Melatonin 10 MG Tabs Take 10 mg by mouth at bedtime.   metFORMIN  500 MG tablet Commonly known as: GLUCOPHAGE  Take 1 tablet by mouth 2 (two) times daily with a meal.   metoprolol  succinate 25 MG 24 hr tablet Commonly known as: TOPROL -XL Take 1 tablet (25 mg total) by mouth daily. What changed: how much to take   montelukast  10 MG tablet Commonly known as: SINGULAIR  Take 10 mg by mouth at bedtime.   olmesartan  40 MG tablet Commonly known as: BENICAR  Take 1 tablet by mouth once daily   omeprazole 20 MG capsule Commonly known as: PRILOSEC Take 20 mg by mouth 2 (two) times daily before a meal.   OneTouch Verio test strip Generic drug: glucose blood 1 each by Other route daily at 6 (six) AM.   Testosterone  1.62 % Gel 2 Pump 2 (two) times daily.   Vitamin D3 125 MCG (5000 UT) Caps Take 1 capsule by mouth daily at 6 (six) AM.   vitamin E  180 MG (400 UNITS) capsule Take 400 Units by mouth daily.   Zinc  50 MG Tabs Take 50 mg by mouth daily.               Discharge Care Instructions  (From admission, onward)           Start     Ordered   07/01/24 0000  Discharge wound care:       Comments: As on AVS   07/01/24 1051            Disposition:  Home with usual follow up as in AVS   Duration of Discharge Encounter:  APP time: 24 minutes  Signed, Laymon CHRISTELLA Qua, PA-C  07/01/2024 11:09 AM  EP Attending  He is doing well, s/p DDD PM insertion. PPM interrogation under my direction demonstrates normal DDD PM function. He can be discharged home with usual followup.   Danelle Senan Urey,MD

## 2024-07-02 ENCOUNTER — Encounter (HOSPITAL_COMMUNITY): Payer: Self-pay | Admitting: Cardiology

## 2024-07-03 MED FILL — Midazolam HCl Inj 2 MG/2ML (Base Equivalent): INTRAMUSCULAR | Qty: 1.5 | Status: AC

## 2024-07-11 ENCOUNTER — Other Ambulatory Visit (HOSPITAL_COMMUNITY): Payer: Self-pay

## 2024-07-11 ENCOUNTER — Other Ambulatory Visit: Payer: Self-pay | Admitting: Pulmonary Disease

## 2024-07-11 ENCOUNTER — Ambulatory Visit: Attending: Internal Medicine

## 2024-07-11 DIAGNOSIS — I442 Atrioventricular block, complete: Secondary | ICD-10-CM

## 2024-07-11 DIAGNOSIS — I493 Ventricular premature depolarization: Secondary | ICD-10-CM

## 2024-07-11 LAB — CUP PACEART INCLINIC DEVICE CHECK
Battery Remaining Longevity: 72 mo
Battery Voltage: 3.14 V
Brady Statistic RA Percent Paced: 15 %
Brady Statistic RV Percent Paced: 78 %
Date Time Interrogation Session: 20251216172437
Implantable Lead Connection Status: 753985
Implantable Lead Connection Status: 753985
Implantable Lead Implant Date: 20251205
Implantable Lead Implant Date: 20251205
Implantable Lead Location: 753859
Implantable Lead Location: 753860
Implantable Pulse Generator Implant Date: 20251205
Lead Channel Impedance Value: 525 Ohm
Lead Channel Impedance Value: 550 Ohm
Lead Channel Pacing Threshold Amplitude: 0.5 V
Lead Channel Pacing Threshold Amplitude: 0.5 V
Lead Channel Pacing Threshold Amplitude: 0.75 V
Lead Channel Pacing Threshold Amplitude: 0.75 V
Lead Channel Pacing Threshold Pulse Width: 0.5 ms
Lead Channel Pacing Threshold Pulse Width: 0.5 ms
Lead Channel Pacing Threshold Pulse Width: 0.5 ms
Lead Channel Pacing Threshold Pulse Width: 0.5 ms
Lead Channel Sensing Intrinsic Amplitude: 4.1 mV
Lead Channel Sensing Intrinsic Amplitude: 8.2 mV
Lead Channel Setting Pacing Amplitude: 3.5 V
Lead Channel Setting Pacing Amplitude: 3.5 V
Lead Channel Setting Pacing Pulse Width: 0.5 ms
Lead Channel Setting Sensing Sensitivity: 2 mV
Pulse Gen Model: 2272
Pulse Gen Serial Number: 8341295

## 2024-07-11 MED ORDER — METOPROLOL SUCCINATE ER 50 MG PO TB24
25.0000 mg | ORAL_TABLET | Freq: Every day | ORAL | 6 refills | Status: DC
  Filled 2024-07-11 (×2): qty 30, 60d supply, fill #0

## 2024-07-11 NOTE — Progress Notes (Signed)
 Normal dual chamber pacemaker wound check. Presenting rhythm: AS/VP 70-120 w/ frequent PVC's. Wound well healed. Routine testing performed. Thresholds, sensing, and impedance consistent with implant measurements and at 3.5V safety margin/auto capture until 3 month visit. Multiple AMS episodes noted c/w atrial driven tachycardia. AT/AF burden <1%. PMT alert noted. Reviewed arm restrictions to continue for 6 weeks total post op.  Pt enrolled in remote follow-up.  Negative for V-A conduction. False PMT. Patient found to be Tachy-Brady at times as well as Bigeminy & Trigeminy PVC's. PVC burden 11%. Patient asymptomatic. On presenting atrial undersensing noted at times d/t atrial sensed events falling directly on top of PVC. [employer] Rep, [place], present and unable to make any device changes at this time.   Spoke w/ Daphne Barrack, NP who advises increasing Metoprolol  from 25mg  to 50mg  daily in order to suppress PVC's. Patient leaving for [place]  tomorrow 07/11/2024 and message sent to scheduler to schedule F/U appointment after arriving home from [place]  to reassess PVC burden and assess medication dosage increase.  Programming Changes: - RV output Auto programmed OFF - RV output reprogrammed to 3.5V @ 0.34ms until 3 month F/U.

## 2024-07-11 NOTE — Progress Notes (Signed)
 Called into Device Clinic to review patient ectopy on programmer.  Per Journalist, [employer], no V to A conduction on check.  BP 126/82.  Pt reports he runs 120-140 systolic at home.  He has 11% PVC's on device, bursts of atrial tachy noted.     Plan  -increase Toprol  to 50 mg daily  -have asked the EP scheduler to reach out to him for EP APP appt in 2-4 weeks to follow up on PVC burden, ectopy  -cautioned on balance, dizziness etc.  If he has difficulty, to return to 25 mg and let EP team know.     Daphne Barrack, NP-C, AGACNP-BC [facility] - Electrophysiology  07/11/2024, 5:09 PM

## 2024-07-11 NOTE — Patient Instructions (Signed)
°  After Your Pacemaker   Monitor your pacemaker site for redness, swelling, and drainage. Call the device clinic at [phone] if you experience these symptoms or fever/chills.  Your incision was closed with Steri-strips or staples:  You may shower 7 days after your procedure and wash your incision with soap and water. Avoid lotions, ointments, or perfumes over your incision until it is well-healed.  You may use a hot tub or a pool after your wound check appointment if the incision is completely closed.  Do not lift, push or pull greater than 10 pounds with the affected arm until January 16th, 2025. There are no other restrictions in arm movement after your wound check appointment.  You may drive, unless driving has been restricted by your healthcare providers.  Remote monitoring is used to monitor your pacemaker from home. This monitoring is scheduled every 91 days by our office. It allows us  to keep an eye on the functioning of your device to ensure it is working properly. You will routinely see your Electrophysiologist annually (more often if necessary).

## 2024-07-12 ENCOUNTER — Other Ambulatory Visit: Payer: Self-pay

## 2024-07-13 MED ORDER — METOPROLOL SUCCINATE ER 50 MG PO TB24: 50.0000 mg | ORAL_TABLET | Freq: Every day | ORAL | 3 refills | Status: DC

## 2024-07-13 NOTE — Addendum Note (Signed)
 Addended by: CLAUDENE NEST A on: 07/13/2024 02:06 PM   Modules accepted: Orders

## 2024-07-14 ENCOUNTER — Other Ambulatory Visit (HOSPITAL_BASED_OUTPATIENT_CLINIC_OR_DEPARTMENT_OTHER): Payer: Self-pay

## 2024-07-25 ENCOUNTER — Other Ambulatory Visit: Payer: Self-pay | Admitting: Cardiovascular Disease

## 2024-07-26 MED ORDER — ATORVASTATIN CALCIUM 40 MG PO TABS: 40.0000 mg | ORAL_TABLET | Freq: Every day | ORAL | 3 refills | Status: AC

## 2024-07-28 ENCOUNTER — Telehealth: Payer: Self-pay

## 2024-07-28 NOTE — Telephone Encounter (Signed)
 Alert received from [employer] for new onset AF/AFL in progress from 07/25/24, controlled rates, no hx of PAF, no OAC per EPIC.  Attempted to contact patient. No answer, left message to call back.

## 2024-07-31 NOTE — Telephone Encounter (Signed)
 Called and spoke with patient and briefly educated on AF/AFL  This RN requested a manual transmission to see if patient was still actively in AF/AFL  Transmission received and reviewed  Presenting EGM showed a AS-AP/VS-VP rhythm not c/w AF/AFL  Patient denies experiencing any symptoms during the time he was in AF/AFL  Patient has appointment already scheduled with RU, PA-C next week on 08/07/24  Routing to RU, PA-C for awareness  Appointment notes updated  Patient given DC number and instructed to call if he becomes symptomatic (dizzy, lightheaded, palpitations, chest pain, SOB, vision changes, etc.) and patient verbalized understanding  All questions and concerns addressed at time of call  Patient appreciative of phone call

## 2024-08-06 NOTE — Progress Notes (Unsigned)
" °  Cardiology Office Note:  .   Date:  08/06/2024  ID:  Peter Mullen, DOB Mar 27, 1932, MRN [id number] PCP: Peter Charlie ORN, MD  [facility] Providers Cardiologist:  Peter Balding, MD Electrophysiologist:  Peter ONEIDA HOLTS, MD (Inactive) >> Dr. Kennyth  History of Present Illness: .   Peter Mullen is a 89 y.o. male w/PMHx of  HTN, HLD, DM, OSA (mild) CAD (CABG 2001, LIMA-LAD, SVG-diagonal, SVG-PLA, SVG-PDA) Advanced heart bock > PPM PVCs AFib  PPM implant wound check 12/16 noted PVC burden 11% and some PATs Toprol  increased  Subsequent device alerts for new onset Afib/flutter  Today's visit is scheduled to f/u on PVC burden,new Afib ROS:   *** OAC, surgeries, bleeding... *** symptoms *** burden, rate *** PVCs  Device information Abbott dual chamber PPM implanted 06/30/24  Arrhythmia/AAD hx PVCs AFib, Jan 2026  Studies Reviewed: Peter Mullen    EKG not done today  DEVICE interrogation done today and reviewed by myself *** Battery and lead measurements are good ***   Echo 06/28/24:  1. Left ventricular ejection fraction, by estimation, is 45 to 50%. The  left ventricle has mildly decreased function. The left ventricle  demonstrates global hypokinesis. Left ventricular diastolic parameters are  indeterminate.   2. Right ventricular systolic function is mildly reduced. The right  ventricular size is mildly enlarged. There is mildly elevated pulmonary  artery systolic pressure. The estimated right ventricular systolic  pressure is 41.2 mmHg.   3. The mitral valve is grossly normal. Mild mitral valve regurgitation.  No evidence of mitral stenosis.   4. The aortic valve is tricuspid. There is mild calcification of the  aortic valve. Aortic valve regurgitation is trivial. Aortic valve  sclerosis/calcification is present, without any evidence of aortic  stenosis.   5. The inferior vena cava is normal in size with greater than 50%  respiratory variability,  suggesting right atrial pressure of 3 mmHg.    Risk Assessment/Calculations:    Physical Exam:   VS:  There were no vitals taken for this visit.   Wt Readings from Last 3 Encounters:  06/30/24 182 lb (82.6 kg)  06/29/24 186 lb (84.4 kg)  06/26/24 188 lb 3.2 oz (85.4 kg)    GEN: Well nourished, well developed in no acute distress NECK: No JVD; No carotid bruits CARDIAC: ***RRR, no murmurs, rubs, gallops RESPIRATORY:  *** CTA b/l without rales, wheezing or rhonchi  ABDOMEN: Soft, non-tender, non-distended EXTREMITIES: *** No edema; No deformity   PPM site: *** is stable, no thinning, fluctuation, tethering  ASSESSMENT AND PLAN: .    PPM *** intact function *** no programming changes made  paroxysmal AFib/flutter CHA2DS2Vasc is 6 *** % burden *** longest > 1 day in duration ***  PVCs *** % burden  CAD *** C/w Dr. Phillis  Secondary hypercoagulable state 2/2 AFib     {Are you ordering a CV Procedure (e.g. stress test, cath, DCCV, TEE, etc)?   Press F2        :[id number]}     Dispo: ***  Signed, Peter Macario Arthur, PA-C   "

## 2024-08-07 ENCOUNTER — Ambulatory Visit: Attending: Physician Assistant | Admitting: Physician Assistant

## 2024-08-07 ENCOUNTER — Other Ambulatory Visit (HOSPITAL_COMMUNITY): Payer: Self-pay

## 2024-08-07 VITALS — BP 120/72 | HR 61 | Ht 65.0 in | Wt 189.0 lb

## 2024-08-07 DIAGNOSIS — I2510 Atherosclerotic heart disease of native coronary artery without angina pectoris: Secondary | ICD-10-CM | POA: Diagnosis not present

## 2024-08-07 DIAGNOSIS — I493 Ventricular premature depolarization: Secondary | ICD-10-CM | POA: Diagnosis not present

## 2024-08-07 DIAGNOSIS — D6869 Other thrombophilia: Secondary | ICD-10-CM

## 2024-08-07 DIAGNOSIS — Z950 Presence of cardiac pacemaker: Secondary | ICD-10-CM

## 2024-08-07 DIAGNOSIS — I4892 Unspecified atrial flutter: Secondary | ICD-10-CM | POA: Diagnosis not present

## 2024-08-07 LAB — CUP PACEART INCLINIC DEVICE CHECK
Battery Remaining Longevity: 68 mo
Battery Voltage: 3.04 V
Brady Statistic RA Percent Paced: 20 %
Brady Statistic RV Percent Paced: 79 %
Date Time Interrogation Session: 20260112120751
Implantable Lead Connection Status: 753985
Implantable Lead Connection Status: 753985
Implantable Lead Implant Date: 20251205
Implantable Lead Implant Date: 20251205
Implantable Lead Location: 753859
Implantable Lead Location: 753860
Implantable Pulse Generator Implant Date: 20251205
Lead Channel Impedance Value: 525 Ohm
Lead Channel Impedance Value: 525 Ohm
Lead Channel Pacing Threshold Amplitude: 0.75 V
Lead Channel Pacing Threshold Amplitude: 0.75 V
Lead Channel Pacing Threshold Amplitude: 0.75 V
Lead Channel Pacing Threshold Amplitude: 0.75 V
Lead Channel Pacing Threshold Pulse Width: 0.5 ms
Lead Channel Pacing Threshold Pulse Width: 0.5 ms
Lead Channel Pacing Threshold Pulse Width: 0.5 ms
Lead Channel Pacing Threshold Pulse Width: 0.5 ms
Lead Channel Sensing Intrinsic Amplitude: 12 mV
Lead Channel Sensing Intrinsic Amplitude: 2.1 mV
Lead Channel Setting Pacing Amplitude: 3.5 V
Lead Channel Setting Pacing Amplitude: 3.5 V
Lead Channel Setting Pacing Pulse Width: 0.5 ms
Lead Channel Setting Sensing Sensitivity: 2 mV
Pulse Gen Model: 2272
Pulse Gen Serial Number: 8341295

## 2024-08-07 MED ORDER — METOPROLOL SUCCINATE ER 50 MG PO TB24: 50.0000 mg | ORAL_TABLET | Freq: Two times a day (BID) | ORAL | 3 refills | Status: AC

## 2024-08-07 MED ORDER — APIXABAN 2.5 MG PO TABS
2.5000 mg | ORAL_TABLET | Freq: Two times a day (BID) | ORAL | 2 refills | Status: AC
  Filled 2024-08-07: qty 180, 90d supply, fill #0

## 2024-08-07 NOTE — Patient Instructions (Addendum)
 Medication Instructions:   START TAKING:  ELIQUIS  2.5 MG TWICE  A  DAY     START TAKING:  METOPROLOL   50 MG TWICE   A  DAY    STOP TAKING AND REMOVE THIS MEDICATION FROM YOUR MEDICATION LIST:   ASPIRIN     *If you need a refill on your cardiac medications before your next appointment, please call your pharmacy*    Lab Work:  NONE ORDERED  TODAY   If you have labs (blood work) drawn today and your tests are completely normal, you will receive your results only by: MyChart Message (if you have MyChart) OR A paper copy in the mail If you have any lab test that is abnormal or we need to change your treatment, we will call you to review the results.    Testing/Procedures:  NONE ORDERED  TODAY    Follow-Up: At [facility], you and your health needs are our priority.  As part of our continuing mission to provide you with exceptional heart care, our providers are all part of one team.  This team includes your primary Cardiologist (physician) and Advanced Practice Providers or APPs (Physician Assistants and Nurse Practitioners) who all work together to provide you with the care you need, when you need it.  Your next appointment:   2 -3  week  Provider:   Fonda Kitty, MD or Charlies Arthur, PA-C  ( CONTACT  CASSIE HALL/ ANGELINE HAMMER FOR EP SCHEDULING ISSUES )   We recommend signing up for the patient portal called MyChart.  Sign up information is provided on this After Visit Summary.  MyChart is used to connect with patients for Virtual Visits (Telemedicine).  Patients are able to view lab/test results, encounter notes, upcoming appointments, etc.  Non-urgent messages can be sent to your provider as well.   To learn more about what you can do with MyChart, go to [email or web address].   Other Instructions

## 2024-08-11 ENCOUNTER — Ambulatory Visit: Attending: Cardiology

## 2024-08-11 DIAGNOSIS — I442 Atrioventricular block, complete: Secondary | ICD-10-CM | POA: Diagnosis not present

## 2024-08-11 LAB — CUP PACEART REMOTE DEVICE CHECK
Battery Remaining Longevity: 62 mo
Battery Remaining Percentage: 95.5 %
Battery Voltage: 3.04 V
Brady Statistic AP VP Percent: 30 %
Brady Statistic AP VS Percent: 1 %
Brady Statistic AS VP Percent: 51 %
Brady Statistic AS VS Percent: 7.7 %
Brady Statistic RA Percent Paced: 21 %
Brady Statistic RV Percent Paced: 81 %
Date Time Interrogation Session: 20260116025201
Implantable Lead Connection Status: 753985
Implantable Lead Connection Status: 753985
Implantable Lead Implant Date: 20251205
Implantable Lead Implant Date: 20251205
Implantable Lead Location: 753859
Implantable Lead Location: 753860
Implantable Pulse Generator Implant Date: 20251205
Lead Channel Impedance Value: 510 Ohm
Lead Channel Impedance Value: 510 Ohm
Lead Channel Pacing Threshold Amplitude: 0.75 V
Lead Channel Pacing Threshold Amplitude: 0.75 V
Lead Channel Pacing Threshold Pulse Width: 0.5 ms
Lead Channel Pacing Threshold Pulse Width: 0.5 ms
Lead Channel Sensing Intrinsic Amplitude: 1.6 mV
Lead Channel Sensing Intrinsic Amplitude: 12 mV
Lead Channel Setting Pacing Amplitude: 3.5 V
Lead Channel Setting Pacing Amplitude: 3.5 V
Lead Channel Setting Pacing Pulse Width: 0.5 ms
Lead Channel Setting Sensing Sensitivity: 2 mV
Pulse Gen Model: 2272
Pulse Gen Serial Number: 8341295

## 2024-08-13 ENCOUNTER — Ambulatory Visit: Payer: Self-pay | Admitting: Cardiology

## 2024-08-17 ENCOUNTER — Other Ambulatory Visit: Payer: Self-pay

## 2024-08-17 NOTE — Progress Notes (Signed)
 Remote PPM Transmission

## 2024-08-24 ENCOUNTER — Telehealth: Payer: Self-pay

## 2024-08-24 ENCOUNTER — Telehealth: Payer: Self-pay | Admitting: Cardiology

## 2024-08-24 NOTE — Telephone Encounter (Signed)
 Spoke with patient and set up with AF clinic for 09/06/24 at 230pm.  Patient aware and verbalizes understanding.  If becomes symptomatic or has any concerns in interim, he was given device clinic number to call and follow up .

## 2024-08-24 NOTE — Telephone Encounter (Signed)
 Patient was called to reset [employer].

## 2024-08-24 NOTE — Telephone Encounter (Signed)
 Renee -  When you saw patient on 08/07/24 - he was started on Eliquis  and Metoprolol  50mg  bid.  You put in your note he should return in 2-3 weeks.    I noticed that he has an appt for 3/6 with Evan for his 90 day but no other appts have been made.    Transmission received today shows patient has been in AF persistent since 08/13/24. Also, PVC burden remains elevated at 11% without any change yet since he saw you.   Is it okay to keep the 3/6 appt or would you like to see if we can move him up sooner?  May have to have a work in spot as everyone's schedules are very full.  But, want to do the right thing for the patient as you determine is appropriate.   Thanks.

## 2024-09-06 ENCOUNTER — Ambulatory Visit (HOSPITAL_COMMUNITY): Admitting: Physician Assistant

## 2024-09-29 ENCOUNTER — Ambulatory Visit: Admitting: Cardiology

## 2024-11-10 ENCOUNTER — Ambulatory Visit

## 2025-02-09 ENCOUNTER — Ambulatory Visit

## 2025-05-11 ENCOUNTER — Ambulatory Visit
